# Patient Record
Sex: Female | Born: 1935 | Race: White | Hispanic: No
Health system: Southern US, Community
[De-identification: ages and names within clinical notes are randomized; demographics above are authoritative.]

## PROBLEM LIST (undated history)

## (undated) DIAGNOSIS — R062 Wheezing: Secondary | ICD-10-CM

## (undated) DIAGNOSIS — R0609 Other forms of dyspnea: Secondary | ICD-10-CM

## (undated) DIAGNOSIS — M1990 Unspecified osteoarthritis, unspecified site: Secondary | ICD-10-CM

## (undated) DIAGNOSIS — K219 Gastro-esophageal reflux disease without esophagitis: Secondary | ICD-10-CM

## (undated) DIAGNOSIS — N951 Menopausal and female climacteric states: Secondary | ICD-10-CM

## (undated) DIAGNOSIS — F430 Acute stress reaction: Secondary | ICD-10-CM

## (undated) DIAGNOSIS — J209 Acute bronchitis, unspecified: Secondary | ICD-10-CM

## (undated) DIAGNOSIS — R4789 Other speech disturbances: Secondary | ICD-10-CM

## (undated) DIAGNOSIS — E222 Syndrome of inappropriate secretion of antidiuretic hormone: Secondary | ICD-10-CM

## (undated) DIAGNOSIS — F4323 Adjustment disorder with mixed anxiety and depressed mood: Secondary | ICD-10-CM

## (undated) DIAGNOSIS — J45909 Unspecified asthma, uncomplicated: Secondary | ICD-10-CM

## (undated) DIAGNOSIS — R002 Palpitations: Secondary | ICD-10-CM

## (undated) DIAGNOSIS — E1149 Type 2 diabetes mellitus with other diabetic neurological complication: Secondary | ICD-10-CM

## (undated) DIAGNOSIS — D518 Other vitamin B12 deficiency anemias: Secondary | ICD-10-CM

## (undated) DIAGNOSIS — R0989 Other specified symptoms and signs involving the circulatory and respiratory systems: Secondary | ICD-10-CM

## (undated) DIAGNOSIS — M62838 Other muscle spasm: Secondary | ICD-10-CM

## (undated) DIAGNOSIS — N183 Chronic kidney disease, stage 3 unspecified: Secondary | ICD-10-CM

## (undated) DIAGNOSIS — E0590 Thyrotoxicosis, unspecified without thyrotoxic crisis or storm: Secondary | ICD-10-CM

## (undated) DIAGNOSIS — L242 Irritant contact dermatitis due to solvents: Secondary | ICD-10-CM

## (undated) DIAGNOSIS — R4182 Altered mental status, unspecified: Secondary | ICD-10-CM

## (undated) DIAGNOSIS — G4733 Obstructive sleep apnea (adult) (pediatric): Secondary | ICD-10-CM

## (undated) DIAGNOSIS — E785 Hyperlipidemia, unspecified: Secondary | ICD-10-CM

## (undated) DIAGNOSIS — G4700 Insomnia, unspecified: Secondary | ICD-10-CM

## (undated) DIAGNOSIS — N644 Mastodynia: Secondary | ICD-10-CM

## (undated) DIAGNOSIS — K921 Melena: Secondary | ICD-10-CM

## (undated) DIAGNOSIS — R5381 Other malaise: Secondary | ICD-10-CM

## (undated) DIAGNOSIS — I10 Essential (primary) hypertension: Secondary | ICD-10-CM

## (undated) DIAGNOSIS — L259 Unspecified contact dermatitis, unspecified cause: Secondary | ICD-10-CM

## (undated) DIAGNOSIS — R5383 Other fatigue: Secondary | ICD-10-CM

## (undated) DIAGNOSIS — N898 Other specified noninflammatory disorders of vagina: Secondary | ICD-10-CM

## (undated) DIAGNOSIS — M109 Gout, unspecified: Secondary | ICD-10-CM

## (undated) DIAGNOSIS — Z22322 Carrier or suspected carrier of Methicillin resistant Staphylococcus aureus: Secondary | ICD-10-CM

## (undated) DIAGNOSIS — E119 Type 2 diabetes mellitus without complications: Secondary | ICD-10-CM

## (undated) DIAGNOSIS — M25559 Pain in unspecified hip: Secondary | ICD-10-CM

## (undated) DIAGNOSIS — B351 Tinea unguium: Secondary | ICD-10-CM

## (undated) DIAGNOSIS — R82998 Other abnormal findings in urine: Secondary | ICD-10-CM

## (undated) DIAGNOSIS — W19XXXA Unspecified fall, initial encounter: Secondary | ICD-10-CM

## (undated) HISTORY — DX: Melena: K92.1

## (undated) HISTORY — DX: Essential (primary) hypertension: I10

## (undated) HISTORY — DX: Other muscle spasm: M62.838

## (undated) HISTORY — DX: Palpitations: R00.2

## (undated) HISTORY — PX: LUMBAR SPINE SURGERY: SHX701

## (undated) HISTORY — DX: Unspecified contact dermatitis, unspecified cause: L25.9

## (undated) HISTORY — DX: Other specified symptoms and signs involving the circulatory and respiratory systems: R06.09

## (undated) HISTORY — DX: Irritant contact dermatitis due to solvents: L24.2

## (undated) HISTORY — DX: Wheezing: R06.2

## (undated) HISTORY — DX: Chronic kidney disease, stage 3 (moderate): N18.3

## (undated) HISTORY — DX: Unspecified asthma, uncomplicated: J45.909

## (undated) HISTORY — DX: Insomnia, unspecified: G47.00

## (undated) HISTORY — DX: Pain in unspecified hip: M25.559

## (undated) HISTORY — DX: Hyperlipidemia, unspecified: E78.5

## (undated) HISTORY — DX: Unspecified fall, initial encounter: W19.XXXA

## (undated) HISTORY — DX: Adjustment disorder with mixed anxiety and depressed mood: F43.23

## (undated) HISTORY — DX: Other specified symptoms and signs involving the circulatory and respiratory systems: R09.89

## (undated) HISTORY — DX: Other specified noninflammatory disorders of vagina: N89.8

## (undated) HISTORY — DX: Other speech disturbances: R47.89

## (undated) HISTORY — DX: Unspecified osteoarthritis, unspecified site: M19.90

## (undated) HISTORY — DX: Type 2 diabetes mellitus without complications: E11.9

## (undated) HISTORY — DX: Tinea unguium: B35.1

## (undated) HISTORY — DX: Mastodynia: N64.4

## (undated) HISTORY — DX: Obstructive sleep apnea (adult) (pediatric): G47.33

## (undated) HISTORY — DX: Type 2 diabetes mellitus with other diabetic neurological complication: E11.49

## (undated) HISTORY — DX: Altered mental status, unspecified: R41.82

## (undated) HISTORY — DX: Other abnormal findings in urine: R82.998

## (undated) HISTORY — DX: Acute stress reaction: F43.0

## (undated) HISTORY — DX: Other fatigue: R53.83

## (undated) HISTORY — DX: Gastro-esophageal reflux disease without esophagitis: K21.9

## (undated) HISTORY — DX: Acute bronchitis, unspecified: J20.9

## (undated) HISTORY — DX: Other vitamin B12 deficiency anemias: D51.8

## (undated) HISTORY — PX: ABDOMINAL HYSTERECTOMY: SHX81

## (undated) HISTORY — DX: Gout, unspecified: M10.9

## (undated) HISTORY — DX: Other malaise: R53.81

## (undated) HISTORY — DX: Menopausal and female climacteric states: N95.1

## (undated) HISTORY — PX: APPENDECTOMY: SHX54

## (undated) HISTORY — DX: Thyrotoxicosis, unspecified without thyrotoxic crisis or storm: E05.90

## (undated) HISTORY — PX: TOTAL KNEE ARTHROPLASTY: SHX125

## (undated) HISTORY — PX: CERVICAL FUSION: SHX112

## (undated) HISTORY — DX: Chronic kidney disease, stage 3 unspecified: N18.30

---

## 1999-09-15 ENCOUNTER — Other Ambulatory Visit: Admission: RE | Admit: 1999-09-15 | Discharge: 1999-09-15 | Payer: Self-pay | Admitting: Internal Medicine

## 1999-09-19 ENCOUNTER — Encounter (HOSPITAL_COMMUNITY): Admission: RE | Admit: 1999-09-19 | Discharge: 1999-09-19 | Payer: Self-pay | Admitting: Internal Medicine

## 1999-10-11 ENCOUNTER — Encounter: Payer: Self-pay | Admitting: Internal Medicine

## 1999-10-11 ENCOUNTER — Encounter: Admission: RE | Admit: 1999-10-11 | Discharge: 1999-10-11 | Payer: Self-pay | Admitting: Internal Medicine

## 1999-10-19 ENCOUNTER — Encounter: Admission: RE | Admit: 1999-10-19 | Discharge: 1999-11-22 | Payer: Self-pay | Admitting: Internal Medicine

## 1999-10-25 ENCOUNTER — Encounter: Payer: Self-pay | Admitting: Internal Medicine

## 1999-10-25 ENCOUNTER — Encounter: Admission: RE | Admit: 1999-10-25 | Discharge: 1999-10-25 | Payer: Self-pay | Admitting: Internal Medicine

## 1999-11-18 ENCOUNTER — Ambulatory Visit (HOSPITAL_COMMUNITY): Admission: RE | Admit: 1999-11-18 | Discharge: 1999-11-18 | Payer: Self-pay | Admitting: Cardiology

## 1999-12-29 ENCOUNTER — Encounter: Payer: Self-pay | Admitting: Neurological Surgery

## 1999-12-29 ENCOUNTER — Encounter: Admission: RE | Admit: 1999-12-29 | Discharge: 1999-12-29 | Payer: Self-pay | Admitting: Neurological Surgery

## 2000-01-16 ENCOUNTER — Ambulatory Visit (HOSPITAL_COMMUNITY): Admission: RE | Admit: 2000-01-16 | Discharge: 2000-01-16 | Payer: Self-pay | Admitting: Cardiology

## 2000-02-05 ENCOUNTER — Emergency Department (HOSPITAL_COMMUNITY): Admission: EM | Admit: 2000-02-05 | Discharge: 2000-02-05 | Payer: Self-pay | Admitting: *Deleted

## 2000-02-16 ENCOUNTER — Ambulatory Visit: Admission: RE | Admit: 2000-02-16 | Discharge: 2000-02-16 | Payer: Self-pay | Admitting: Pulmonary Disease

## 2000-05-15 ENCOUNTER — Encounter: Payer: Self-pay | Admitting: Allergy and Immunology

## 2000-05-15 ENCOUNTER — Encounter: Admission: RE | Admit: 2000-05-15 | Discharge: 2000-05-15 | Payer: Self-pay | Admitting: *Deleted

## 2000-08-14 ENCOUNTER — Encounter: Payer: Self-pay | Admitting: Pulmonary Disease

## 2000-08-14 ENCOUNTER — Ambulatory Visit (HOSPITAL_COMMUNITY): Admission: RE | Admit: 2000-08-14 | Discharge: 2000-08-14 | Payer: Self-pay | Admitting: Pulmonary Disease

## 2001-01-30 ENCOUNTER — Other Ambulatory Visit: Admission: RE | Admit: 2001-01-30 | Discharge: 2001-01-30 | Payer: Self-pay | Admitting: Internal Medicine

## 2002-06-26 ENCOUNTER — Encounter: Payer: Self-pay | Admitting: Orthopedic Surgery

## 2002-07-02 ENCOUNTER — Inpatient Hospital Stay (HOSPITAL_COMMUNITY): Admission: RE | Admit: 2002-07-02 | Discharge: 2002-07-09 | Payer: Self-pay | Admitting: Orthopedic Surgery

## 2003-03-06 ENCOUNTER — Ambulatory Visit (HOSPITAL_COMMUNITY): Admission: RE | Admit: 2003-03-06 | Discharge: 2003-03-06 | Payer: Self-pay | Admitting: Internal Medicine

## 2005-10-06 ENCOUNTER — Ambulatory Visit: Payer: Self-pay | Admitting: Cardiology

## 2005-10-11 ENCOUNTER — Ambulatory Visit: Payer: Self-pay

## 2005-10-13 ENCOUNTER — Ambulatory Visit: Payer: Self-pay | Admitting: Cardiology

## 2005-10-16 ENCOUNTER — Ambulatory Visit: Admission: RE | Admit: 2005-10-16 | Discharge: 2005-10-16 | Payer: Self-pay | Admitting: Cardiology

## 2005-10-19 ENCOUNTER — Ambulatory Visit: Payer: Self-pay

## 2005-10-24 ENCOUNTER — Ambulatory Visit: Payer: Self-pay | Admitting: Cardiology

## 2005-10-31 ENCOUNTER — Ambulatory Visit: Payer: Self-pay | Admitting: Gastroenterology

## 2005-11-02 ENCOUNTER — Ambulatory Visit: Payer: Self-pay | Admitting: Pulmonary Disease

## 2006-02-05 ENCOUNTER — Ambulatory Visit: Payer: Self-pay | Admitting: Pulmonary Disease

## 2006-03-19 ENCOUNTER — Ambulatory Visit: Payer: Self-pay | Admitting: Pulmonary Disease

## 2006-05-25 ENCOUNTER — Encounter: Admission: RE | Admit: 2006-05-25 | Discharge: 2006-05-25 | Payer: Self-pay | Admitting: Internal Medicine

## 2007-02-18 ENCOUNTER — Encounter: Admission: RE | Admit: 2007-02-18 | Discharge: 2007-02-18 | Payer: Self-pay | Admitting: Neurological Surgery

## 2007-03-26 ENCOUNTER — Inpatient Hospital Stay (HOSPITAL_COMMUNITY): Admission: RE | Admit: 2007-03-26 | Discharge: 2007-03-28 | Payer: Self-pay | Admitting: Neurological Surgery

## 2007-04-29 ENCOUNTER — Encounter: Admission: RE | Admit: 2007-04-29 | Discharge: 2007-05-20 | Payer: Self-pay | Admitting: Specialist

## 2008-01-06 ENCOUNTER — Ambulatory Visit: Payer: Self-pay | Admitting: Cardiology

## 2008-01-06 LAB — CONVERTED CEMR LAB: Pro B Natriuretic peptide (BNP): 14 pg/mL (ref 0.0–100.0)

## 2008-01-15 ENCOUNTER — Encounter: Payer: Self-pay | Admitting: Cardiology

## 2008-01-15 ENCOUNTER — Encounter: Payer: Self-pay | Admitting: Internal Medicine

## 2008-01-15 ENCOUNTER — Ambulatory Visit: Payer: Self-pay

## 2008-02-05 DIAGNOSIS — R0602 Shortness of breath: Secondary | ICD-10-CM

## 2008-02-05 DIAGNOSIS — J45909 Unspecified asthma, uncomplicated: Secondary | ICD-10-CM

## 2008-02-05 DIAGNOSIS — K449 Diaphragmatic hernia without obstruction or gangrene: Secondary | ICD-10-CM | POA: Insufficient documentation

## 2008-02-05 DIAGNOSIS — K219 Gastro-esophageal reflux disease without esophagitis: Secondary | ICD-10-CM | POA: Insufficient documentation

## 2008-02-05 DIAGNOSIS — E785 Hyperlipidemia, unspecified: Secondary | ICD-10-CM | POA: Insufficient documentation

## 2008-02-05 DIAGNOSIS — G4733 Obstructive sleep apnea (adult) (pediatric): Secondary | ICD-10-CM

## 2008-02-06 ENCOUNTER — Ambulatory Visit: Payer: Self-pay | Admitting: Internal Medicine

## 2008-02-06 DIAGNOSIS — R05 Cough: Secondary | ICD-10-CM

## 2008-02-10 ENCOUNTER — Ambulatory Visit: Payer: Self-pay | Admitting: Cardiology

## 2008-02-20 ENCOUNTER — Ambulatory Visit: Payer: Self-pay | Admitting: Internal Medicine

## 2008-03-09 ENCOUNTER — Ambulatory Visit: Payer: Self-pay | Admitting: Internal Medicine

## 2008-03-11 ENCOUNTER — Ambulatory Visit: Payer: Self-pay | Admitting: Cardiology

## 2008-04-08 ENCOUNTER — Ambulatory Visit: Payer: Self-pay | Admitting: Internal Medicine

## 2008-04-08 DIAGNOSIS — R942 Abnormal results of pulmonary function studies: Secondary | ICD-10-CM | POA: Insufficient documentation

## 2008-05-04 ENCOUNTER — Encounter: Payer: Self-pay | Admitting: Internal Medicine

## 2008-05-08 ENCOUNTER — Ambulatory Visit: Payer: Self-pay | Admitting: Internal Medicine

## 2008-05-19 ENCOUNTER — Encounter: Payer: Self-pay | Admitting: Internal Medicine

## 2008-07-08 ENCOUNTER — Encounter (HOSPITAL_COMMUNITY): Admission: RE | Admit: 2008-07-08 | Discharge: 2008-10-06 | Payer: Self-pay | Admitting: Internal Medicine

## 2008-08-12 ENCOUNTER — Telehealth (INDEPENDENT_AMBULATORY_CARE_PROVIDER_SITE_OTHER): Payer: Self-pay | Admitting: *Deleted

## 2008-09-01 ENCOUNTER — Ambulatory Visit: Payer: Self-pay | Admitting: Internal Medicine

## 2008-10-01 ENCOUNTER — Ambulatory Visit: Payer: Self-pay | Admitting: Internal Medicine

## 2008-12-01 ENCOUNTER — Encounter (INDEPENDENT_AMBULATORY_CARE_PROVIDER_SITE_OTHER): Payer: Self-pay | Admitting: *Deleted

## 2008-12-01 ENCOUNTER — Ambulatory Visit: Payer: Self-pay | Admitting: Internal Medicine

## 2008-12-25 ENCOUNTER — Ambulatory Visit (HOSPITAL_COMMUNITY): Admission: RE | Admit: 2008-12-25 | Discharge: 2008-12-25 | Payer: Self-pay | Admitting: Internal Medicine

## 2008-12-25 ENCOUNTER — Encounter: Payer: Self-pay | Admitting: Internal Medicine

## 2009-01-27 ENCOUNTER — Ambulatory Visit: Payer: Self-pay | Admitting: Internal Medicine

## 2009-02-03 ENCOUNTER — Ambulatory Visit: Payer: Self-pay | Admitting: Internal Medicine

## 2009-02-11 ENCOUNTER — Encounter: Payer: Self-pay | Admitting: Internal Medicine

## 2009-02-11 ENCOUNTER — Ambulatory Visit (HOSPITAL_COMMUNITY): Admission: RE | Admit: 2009-02-11 | Discharge: 2009-02-11 | Payer: Self-pay | Admitting: Pediatrics

## 2009-02-22 ENCOUNTER — Encounter: Payer: Self-pay | Admitting: Internal Medicine

## 2009-02-23 ENCOUNTER — Encounter: Payer: Self-pay | Admitting: Cardiology

## 2009-02-23 ENCOUNTER — Ambulatory Visit: Payer: Self-pay | Admitting: Cardiology

## 2009-03-02 ENCOUNTER — Ambulatory Visit: Payer: Self-pay | Admitting: Cardiology

## 2009-03-02 LAB — CONVERTED CEMR LAB
Basophils Relative: 0.3 % (ref 0.0–3.0)
Chloride: 112 meq/L (ref 96–112)
Eosinophils Relative: 0.4 % (ref 0.0–5.0)
HCT: 34.7 % — ABNORMAL LOW (ref 36.0–46.0)
INR: 1 (ref 0.8–1.0)
Lymphs Abs: 2.1 10*3/uL (ref 0.7–4.0)
MCV: 104.7 fL — ABNORMAL HIGH (ref 78.0–100.0)
Monocytes Absolute: 0.4 10*3/uL (ref 0.1–1.0)
Potassium: 4.3 meq/L (ref 3.5–5.1)
RBC: 3.31 M/uL — ABNORMAL LOW (ref 3.87–5.11)
WBC: 8.1 10*3/uL (ref 4.5–10.5)

## 2009-03-05 ENCOUNTER — Inpatient Hospital Stay (HOSPITAL_BASED_OUTPATIENT_CLINIC_OR_DEPARTMENT_OTHER): Admission: RE | Admit: 2009-03-05 | Discharge: 2009-03-05 | Payer: Self-pay | Admitting: Internal Medicine

## 2009-03-05 ENCOUNTER — Ambulatory Visit: Payer: Self-pay | Admitting: Internal Medicine

## 2009-03-24 ENCOUNTER — Encounter: Payer: Self-pay | Admitting: *Deleted

## 2009-03-24 ENCOUNTER — Ambulatory Visit: Payer: Self-pay | Admitting: Internal Medicine

## 2009-03-24 ENCOUNTER — Encounter: Payer: Self-pay | Admitting: Physician Assistant

## 2009-03-24 DIAGNOSIS — I5032 Chronic diastolic (congestive) heart failure: Secondary | ICD-10-CM

## 2009-04-21 ENCOUNTER — Ambulatory Visit: Payer: Self-pay | Admitting: Internal Medicine

## 2009-06-11 ENCOUNTER — Encounter: Payer: Self-pay | Admitting: Internal Medicine

## 2009-07-05 ENCOUNTER — Ambulatory Visit: Payer: Self-pay | Admitting: Internal Medicine

## 2010-01-19 ENCOUNTER — Telehealth: Payer: Self-pay | Admitting: Internal Medicine

## 2010-02-03 ENCOUNTER — Encounter: Payer: Self-pay | Admitting: Internal Medicine

## 2010-03-02 ENCOUNTER — Ambulatory Visit: Payer: Self-pay | Admitting: Internal Medicine

## 2010-03-02 DIAGNOSIS — J3481 Nasal mucositis (ulcerative): Secondary | ICD-10-CM

## 2010-08-02 ENCOUNTER — Emergency Department (HOSPITAL_COMMUNITY): Admission: EM | Admit: 2010-08-02 | Discharge: 2010-08-03 | Payer: Self-pay | Admitting: Emergency Medicine

## 2010-09-13 ENCOUNTER — Emergency Department (HOSPITAL_COMMUNITY): Admission: EM | Admit: 2010-09-13 | Discharge: 2010-09-13 | Payer: Self-pay | Admitting: Emergency Medicine

## 2010-11-12 ENCOUNTER — Encounter
Admission: RE | Admit: 2010-11-12 | Discharge: 2010-11-12 | Payer: Self-pay | Source: Home / Self Care | Admitting: Neurological Surgery

## 2010-12-13 ENCOUNTER — Ambulatory Visit (HOSPITAL_COMMUNITY)
Admission: RE | Admit: 2010-12-13 | Discharge: 2010-12-13 | Payer: Self-pay | Source: Home / Self Care | Attending: Neurological Surgery | Admitting: Neurological Surgery

## 2011-01-19 NOTE — Assessment & Plan Note (Signed)
Summary: rov///kp   Visit Type:  Follow-up Copy to:  Dr. Larina Earthly Primary Provider/Referring Provider:  Dr. Larina Earthly  CC:  Pt here for9 month follow-up.  Pt c/o productiv ecough with clear phlegm. Pt also c/o sore in her left nostril since starting fluticasone. Marland Kitchen  History of Present Illness: #Followup dyspnea and cough. Normal cardiac cath 2000. Normaly Myoview 2006. Normal CT chest 07/1999. VQ scan normal 2001. Normal BNP Jan 2009. Passive smoker x 56 years. Obese.   #Dyspnea -IOV 02/06/2008: No energy. Gives out easily". Says starts "breathing heavy" and gets "tightness in chest" when walking distances such as parking lot to office. Dyspnea been going on for 4-5 years but has been progressive for past 8 months. Symptoms are relieved by rest. No dyspnea at rest. Gets dyspneic while fixing breakfast, doing laundry, and taking a shower. Extensive cardiac evaluation has been non contributory ( Dr. Jens Som at [place]). ADvair and Albuterol in past have not helped. #OV 03/09/2008: started on symbicort 03/09/2008 for BD response and reduced small airways on otherwise restrictive pft's with low dlco. No evidence of fibrosis on ct chest 02/2008. #OV 04/08/2008  no improvemnt subjectively or objectively on spiro. Symbicort dc'ed. Spiriva started instead. OV 05/08/2008: another 25% imprivement in dyspnea. OV 09/01/2008: has attended 7 weeks rehab. Another 33% improvment in dyspnea. 4 more weeks of rehab left. REC: change spiriva to LABA, continue rehab. #OV 10/01/2008: finished rehab today. Overall 40% improvement with rehab. Still dyspneic with laundry and walkng to car. Thinks is due to back pain.  REC: cont LABA and rehab.   #Cough - started on nasonex 03/09/2008. No relief. So changed to veramyst and astepro on 04/08/2008 with only 10% imprivment on followup 05/2008. Saw ENT Dr. Pollyann Kennedy on 05/19/2008 and got 10d augmentin for chronic max sinusitis. Since then, has had another 30% improvment in cough. OV 09/01/2008:   still with cough, early am sinus drainage and some sputum.  Feels veramyst is helping but astepro is not. REC: stop astepro, start chlorpheniramine. #OV 10/01/2008: cough resolved with chlorphem. REC: stop veramyst, cont chlorpheniramine  #New symptoms - having back pain. Due to see Dr. Danielle Dess.  #OV 12/01/2008:doe with 5 steps, REC: CPSTOV 01/27/2009:  followup after CPST. CPST was done in Jan 2010. She has multifactorial dyspnea. The key things that stood out were a) low VO2 max and Anaerobic threshold confirming definite physiologic limitation to exercise; b) low O2 pulse reflective of circulatory limitation probably from obesity related diastolic dysfn; b) post-exercise significant reduction in Fev1 and FVC; bronchospasm versus resp muscle fatigue; d) low mesured MVV compared to calculated MVV raising the spectre of neuromuscular disease; e) constellation of high Ve/VCO2 concomitantly rising with Ve/VO2 and low PETCO2 reflective of pain/anxiety/hyperventilation as cause of dyspnea.   In terms of symptoms, she is still very dyspneic. She again wonders if back pain is causing dyspnea. She is uanble to do ADLs like cooking. She has intentially lost weight but this is not helping relieve dyspnea. However, cough seems better. She describes it as "not bad". She is diligent with chlorpheniramine and veramyst. She is using hydrocodone as needed. Only other new symptom is that she admits to feeling depressed; describes less activity and less energy past 3-4 months but denies homicidal, suicidal thoughts or sleep issues.  REC: methachoOV 04/21/2009: Dyspnea and cough followup. Had methacholine challenge test 02/11/2009 and this was positive with just saline. Was evaluated by Dr Sandria Manly 02/22/09 and followed some days later.  Per patient neuro workup  was normal including cpk, tsh, and antibodies for myasthenia etc., I am unsure if she had EMG/NCS. She underwent Rt heart and Lt heart caths 03/05/2009. This was essentially normal  but for mildly elevated LVDEP cw diastolic dysfn which was susggested by her CPST. Currently reports good control of cough but dyspnea persists unchanged. She is frustrated with dyspnea and says she is getting depressed. REC: Rx back pain with Elsner, asthma/cough with symbicortOV 07/05/2009. Followup multifactorial dyspnea (asthma, obesity, diastolic dysfunction, and pain). She has seen Dr. Danielle Dess for back pain. Reporeteldy he recommended some injection but she declined. She is on chronic pain meds now. We restarte symbicort at last visit for asthma. After this dyspnea and cough have improved significantly. Now able to walk 60 feet and do some ADLs without dyspnea. Not used rescue albuterol in a long time. She is satisfied with her progress. Less depressed now. She wants veramyst changed to something cheaper  REC generic flonase Continue symbicort, pain control and exercise/rehab for dyspnea  OV 03/02/2010>  Followup multifactorial dyspnea (asthma, obesity, diastolic dysfunction, and pain) and post nasal drainage/cough. Since last visit states she wsa doing real well with symbicort and daily exercises untill 4-5 weeks ago when she had "The flu". Not hospitalized. REcovered. Now deconditioned and more dyspneic but back on treadmill and gaining strenght. Dyspnea improving to baseline. IN terms of post nasal drainge, this was under control with veramyst and 4-5 weeks ago she ran out of veramyst and was informed by insurance company to switch to generic fluticasone. States that generic version has given her a Left nasal ulcer. I reminded her that we swtiched to the generic version 9 months ago at her request but she mainatains that insurance company forced the change on her 4-6 weeks ago. She wants to appeal this  Current Medications (verified): 1)  Centrum Silver   Tabs (Multiple Vitamins-Minerals) .... Take 1 Tablet By Mouth Once A Day 2)  Caltrate 600+d 600-400 Mg-Unit  Tabs (Calcium Carbonate-Vitamin D)  .... Take 1 Tablet By Mouth Once A Day 3)  Lipitor 20 Mg  Tabs (Atorvastatin Calcium) .... Take 1 Tablet By Mouth Once A Day 4)  Nexium 40 Mg  Cpdr (Esomeprazole Magnesium) .... Take 1 Tablet By Mouth Once A Day 5)  Synthroid 112 Mcg  Tabs (Levothyroxine Sodium) .... Once Daily 6)  Clonazepam 1 Mg  Tabs (Clonazepam) .... Take 1 Tablet By Mouth At Bedtime 7)  Vitamin B12 Injection .... Take Once A Month 8)  Premarin 0.625 Mg/gm  Crea (Estrogens, Conjugated) .... Once Daily 9)  Hydromet 5-1.5 Mg/35ml  Syrp (Hydrocodone-Homatropine) .... As Needed 10)  Tramadol Hcl 50 Mg Tabs (Tramadol Hcl) .... Four Times A Day  As Needed For Pain 11)  Symbicort 80-4.5 Mcg/act Aero (Budesonide-Formoterol Fumarate) .... 2 Puffs Twice A Day 12)  Fluticasone Propionate 50 Mcg/act Susp (Fluticasone Propionate) .... 2 Puffs Each Nostril Two Times A Day  Allergies (verified): 1)  ! Aspirin 2)  ! Naprosyn 3)  ! Motrin 4)  ! Voltaren 5)  ! Tolectin 6)  ! Celebrex 7)  ! Clinoril  Past History:  Family History: Last updated: 02/23/2009 Coronary Heart Disease Father:died age 63 with CAD Mother:died age 83 CHF and dementia Siblings:Brother died age 48-drowning Sister age 26 severe stroke and HTN sister good health All father's brothers died of massive MI's and/or strokes  Social History: Last updated: 03/09/2008 does not smoke and rarely consumes alcohol. Daughter is Charity fundraiser at [employer]. Retired Audiological scientist. Widowed. Lives  by self. 38 year heavy passive smoking. [place] exp[place] 1980s  Risk Factors: Smoking Status: never (02/05/2008) Passive Smoke Exposure: yes (02/06/2008)  Past Medical History: Reviewed history from 04/21/2009 and no changes required. #OBSTRUCTIVE SLEEP APNEA (ICD-327.23) x diganosed 2006 after sleep study at [facility] per patient. -> Stopped after CPAP intolerance.  #DYSPNEA (ICD-786.05) - Obese patient with back isues and hx of passive smoking - normal TSH 11/2008 per patient -  Cardiac workup negative 2009 by Dr. Jens Som - CT chest 02/2008 did not show fibrosis - PFts 02/2008 restriction in spirometry (although positive BD response and reduced small aiways) and lung voume and lowered DLCO all c/w restrictive abnormality - CPST Jan 2010 - Low Vo2 and AT. Low O2 pluse. Post Exercise Bronchospasm +, high Ve/VCo2 with rising Ve/Vo2 and low pet co2 reflective of pain/anxiety/hyperventilation causing dyspnea - Positive methacholine with just saline 02/11/2009 - Negative neuro for dyspnea workup by Dr. Sandria Manly 02/2009 - Rt heart cath and LEft heart cath - 02/2009 essentially normal but for mild increase in LVEDP: Dr Jens Som #HYPERLIPIDEMIA (ICD-272.4) #HIATAL HERNIA (ICD-553.3) #GERD (ICD-530.81) #ASTHMA (ICD-493.90) x  -diagnosed by Dr. Jayme Cloud per pt.  Dx made 2001.  - Took Advair for 5 years and quit 3 years ago becuase she felt no difference.  #OBESITY BMI 33 Passive Smoker #HYPOTHYROIDISM  -> on synthroid -> normal TSH in 11/2008 per patient when checked at Dr. Vicente Males office  Past Surgical History: Reviewed history from 02/23/2009 and no changes required. Spondylosis L4-L5 with bilateral lumbar radiculopathy-4/08 Complete hysterectomy Tonsillectomy Right leg tendon surgery Left shoulder tumor removal Left knee surgery  Family History: Reviewed history from 02/23/2009 and no changes required. Coronary Heart Disease Father:died age 18 with CAD Mother:died age 52 CHF and dementia Siblings:Brother died age 51-drowning Sister age 45 severe stroke and HTN sister good health All father's brothers died of massive MI's and/or strokes  Social History: Reviewed history from 03/09/2008 and no changes required. does not smoke and rarely consumes alcohol. Daughter is Charity fundraiser at [employer]. 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 38 year heavy passive smoking. [place] exp[employer] 1980s  Review of Systems       The patient complains of productive cough and headaches.  The  patient denies shortness of breath with activity, shortness of breath at rest, non-productive cough, coughing up blood, chest pain, irregular heartbeats, acid heartburn, indigestion, loss of appetite, weight change, abdominal pain, difficulty swallowing, sore throat, tooth/dental problems, nasal congestion/difficulty breathing through nose, sneezing, itching, ear ache, anxiety, depression, hand/feet swelling, joint stiffness or pain, rash, change in color of mucus, and fever.    Vital Signs:  Patient profile:   77 year old female Height:      63 inches Weight:      172.50 pounds BMI:     30.67 O2 Sat:      96 % on Room air Temp:     97.7 degrees F oral Pulse rate:   95 / minute BP sitting:   136 / 72  (right arm) Cuff size:   regular  Vitals Entered By: Carron Curie CMA (March 02, 2010 3:17 PM)  O2 Flow:  Room air CC: Pt here for9 month follow-up.  Pt c/o productiv ecough with clear phlegm. Pt also c/o sore in her left nostril since starting fluticasone.  Comments Medications reviewed with patient Carron Curie CMA  March 02, 2010 3:20 PM Daytime phone number verified with patient.    Physical Exam  General:  obese.  Head:  normocephalic and atraumatic Eyes:  PERRLA/EOM intact; conjunctiva and sclera clear Ears:  TMs intact and clear with normal canals Nose:  left nostril medial aspect is s a dry ulcer/crud Mouth:  no deformity or lesions Neck:  supple with no JVD. Chest Wall:  no deformities noted Lungs:  clear to auscultation. Heart:  regular rate and rhythm. No murmurs noted. Abdomen:  soft Msk:  mild kyphosis Pulses:  2+ femoral pulses Extremities:  no edema Neurologic:  CN II-XII grossly intact with normal reflexes, coordination, muscle strength and tone Skin:  intact without lesions or rashes Cervical Nodes:  no significant adenopathy Psych:  depressed affect.     Impression & Recommendations:  Problem # 1:  NASAL MUCOSITIS ULCERATIVE  (ICD-478.11) Assessment New ? nasal uncler vs mucosal irritation  plan refer ent Orders: ENT Referral (ENT) Est. Patient Level II (21308)  Problem # 2:  SHORTNESS OF BREATH (SOB) (ICD-786.05) Assessment: Unchanged  Dyspnea due to a) anxiety/pain/hyperventilation >>> b) asthma >>> c) diastolic dysfn in that order of importance No real change since last visit. Had some flu but now better with reconditioning.   PLAN Continue to Address pain control through PMD and Dr. Danielle Dess.  Continue symbicort Cotninue to address diastolic dysfn through weight loss and OSA control - she rejected  CPAP Will see her again in 12 months  Orders: Est. Patient Level II (65784)  Problem # 3:  COUGH (ICD-786.2) Assessment: Deteriorated  under control  plan try to appeal and get veramyst back  Orders: Est. Patient Level II (69629)  Medications Added to Medication List This Visit: 1)  Nexium 40 Mg Cpdr (Esomeprazole magnesium) .... Take 1 tablet by mouth once a day 2)  Clonazepam 1 Mg Tabs (Clonazepam) .... Take 1 tablet by mouth at bedtime  Patient Instructions: 1)  Meet with our [facility] to see what we need to do appeal and get you veramyst back 2)  Please see ENT doctor for the blister in nose 3)  Please follow with Dr. Felipa Eth for routine healthcare 4)  You can see me back in 1 year   Immunization History:  Pneumovax Immunization History:    Pneumovax:  pneumovax (12/19/2007)

## 2011-01-19 NOTE — Progress Notes (Signed)
Summary: prescript  Phone Note Call from Patient   Caller: Patient Call For: Jillian Wheeler Summary of Call: insurance will not pay for verymst what is alternative? Initial call taken by: Rickard Patience,  January 19, 2010 3:21 PM  Follow-up for Phone Call        called spoke with patient, she states that she received a letter from ins company stating that the veramyst is not covered, but flunisolide and fluticasone propionate are preferred tier 1 and nasonex is a tier 2.    MR, is it okay to switch patient to one of these alternatives? Boone Master CNA  January 19, 2010 3:38 PM   Additional Follow-up for Phone Call Additional follow up Details #1::        yes that is ok. she can even try generic fluticasone if that would work. If I remember right she tried that in past but did not like it. Please also tell her to get NETTI POT for sinus wash Additional Follow-up by: Kalman Shan MD,  January 20, 2010 2:14 PM    Additional Follow-up for Phone Call Additional follow up Details #2::    called, spoke with pt.  Pt informed of above statement and recs per MR.  Pt ok to try fluticasone propionate and aware rx sent to [facility].  She verbaized understanding.   Follow-up by: Gweneth Dimitri RN,  January 20, 2010 3:31 PM  New/Updated Medications: FLUTICASONE PROPIONATE 50 MCG/ACT SUSP (FLUTICASONE PROPIONATE) 2 puffs each nostril two times a day Prescriptions: FLUTICASONE PROPIONATE 50 MCG/ACT SUSP (FLUTICASONE PROPIONATE) 2 puffs each nostril two times a day  #1 x 3   Entered by:   Gweneth Dimitri RN   Authorized by:   Kalman Shan MD   Signed by:   Gweneth Dimitri RN on 01/20/2010   Method used:   Faxed to ...       [facility] (retail)       [address]       [place], [place]  [address]       Ph: [phone] or [phone]       Fax: [phone]   RxID:   [id number]

## 2011-01-19 NOTE — Medication Information (Signed)
Summary: Veramyst/[employer]  Veramyst/[employer]   Imported By: Sherian Rein 03/24/2010 10:44:56  _____________________________________________________________________  External Attachment:    Type:   Image     Comment:   External Document

## 2011-02-27 LAB — CBC
Hemoglobin: 11.3 g/dL — ABNORMAL LOW (ref 12.0–15.0)
MCH: 34 pg (ref 26.0–34.0)
MCV: 99.7 fL (ref 78.0–100.0)
RBC: 3.32 MIL/uL — ABNORMAL LOW (ref 3.87–5.11)

## 2011-02-27 LAB — SURGICAL PCR SCREEN: Staphylococcus aureus: NEGATIVE

## 2011-03-02 LAB — BASIC METABOLIC PANEL
GFR calc Af Amer: 47 mL/min — ABNORMAL LOW (ref >60)
GFR calc non Af Amer: 38 mL/min — ABNORMAL LOW (ref >60)
Glucose, Bld: 109 mg/dL — ABNORMAL HIGH (ref 70–99)
Potassium: 4 mEq/L (ref 3.5–5.1)
Sodium: 141 mEq/L (ref 135–145)

## 2011-03-02 LAB — URINE CULTURE: Culture  Setup Time: 201109272014

## 2011-03-02 LAB — CBC
HCT: 35.4 % — ABNORMAL LOW (ref 36.0–46.0)
Hemoglobin: 11.9 g/dL — ABNORMAL LOW (ref 12.0–15.0)
MCH: 34.6 pg — ABNORMAL HIGH (ref 26.0–34.0)
MCHC: 33.6 g/dL (ref 30.0–36.0)
MCV: 102.9 fL — ABNORMAL HIGH (ref 78.0–100.0)
Platelets: 240 K/uL (ref 150–400)
RBC: 3.44 MIL/uL — ABNORMAL LOW (ref 3.87–5.11)
RDW: 13 % (ref 11.5–15.5)
WBC: 7.8 K/uL (ref 4.0–10.5)

## 2011-03-02 LAB — URINE MICROSCOPIC-ADD ON

## 2011-03-02 LAB — HEPATIC FUNCTION PANEL
ALT: 19 U/L (ref 0–35)
AST: 28 U/L (ref 0–37)
Alkaline Phosphatase: 74 U/L (ref 39–117)
Bilirubin, Direct: <0.1 mg/dL (ref 0.0–0.3)
Total Bilirubin: 0.5 mg/dL (ref 0.3–1.2)

## 2011-03-02 LAB — URINALYSIS, ROUTINE W REFLEX MICROSCOPIC
Glucose, UA: NEGATIVE mg/dL
Hgb urine dipstick: NEGATIVE
Ketones, ur: 15 mg/dL — AB
Nitrite: NEGATIVE
Protein, ur: NEGATIVE mg/dL
Specific Gravity, Urine: 1.021 (ref 1.005–1.030)
Urobilinogen, UA: 0.2 mg/dL (ref 0.0–1.0)
pH: 5 (ref 5.0–8.0)

## 2011-03-02 LAB — DIFFERENTIAL
Basophils Absolute: 0 K/uL (ref 0.0–0.1)
Basophils Relative: 0 % (ref 0–1)
Eosinophils Absolute: 0 K/uL (ref 0.0–0.7)
Eosinophils Relative: 0 % (ref 0–5)
Lymphocytes Relative: 36 % (ref 12–46)
Lymphs Abs: 2.8 10*3/uL (ref 0.7–4.0)
Monocytes Absolute: 0.6 10*3/uL (ref 0.1–1.0)
Monocytes Relative: 8 % (ref 3–12)
Neutro Abs: 4.4 10*3/uL (ref 1.7–7.7)
Neutrophils Relative %: 56 % (ref 43–77)

## 2011-03-02 LAB — BASIC METABOLIC PANEL WITH GFR
BUN: 15 mg/dL (ref 6–23)
CO2: 24 meq/L (ref 19–32)
Calcium: 9.8 mg/dL (ref 8.4–10.5)
Chloride: 110 meq/L (ref 96–112)
Creatinine, Ser: 1.32 mg/dL — ABNORMAL HIGH (ref 0.4–1.2)

## 2011-03-30 LAB — POCT I-STAT 3, VENOUS BLOOD GAS (G3P V)
Bicarbonate: 18.2 mEq/L — ABNORMAL LOW (ref 20.0–24.0)
O2 Saturation: 64 %
pCO2, Ven: 35.7 mmHg — ABNORMAL LOW (ref 45.0–50.0)
pH, Ven: 7.32 — ABNORMAL HIGH (ref 7.250–7.300)
pH, Ven: 7.321 — ABNORMAL HIGH (ref 7.250–7.300)
pO2, Ven: 36 mmHg (ref 30.0–45.0)
pO2, Ven: 37 mmHg (ref 30.0–45.0)

## 2011-03-30 LAB — POCT I-STAT 3, ART BLOOD GAS (G3+)
Acid-base deficit: 6 mmol/L — ABNORMAL HIGH (ref 0.0–2.0)
TCO2: 20 mmol/L (ref 0–100)
pH, Arterial: 7.376 (ref 7.350–7.400)

## 2011-05-02 NOTE — Cardiovascular Report (Signed)
NAMEYANILEN, ADAMIK                  ACCOUNT NO.:  [id number]   MEDICAL RECORD NO.:  [id number]          PATIENT TYPE:  OIB   LOCATION:  1962                         FACILITY:  [facility]   PHYSICIAN:  Bevelyn Buckles. Bensimhon, MDDATE OF BIRTH:  February 12, 1925   DATE OF PROCEDURE:  DATE OF DISCHARGE:  03/05/2009                            CARDIAC CATHETERIZATION   REFERRING PHYSICIAN:  Madolyn Frieze. Jens Som, MD, [facility]   PULMONOLOGIST:  Oretha Milch, MD   PATIENT IDENTIFICATION:  Ms. Jillian Wheeler is a delightful 77 year old woman  with history of severe exertional dyspnea.  She was evaluated by Dr.  Vassie Loll and Dr. Jens Som.  She had a cardiac catheterization about 10 years  ago which showed normal coronary arteries.  She is sent for right and  left heart cath to evaluate her dyspnea.   PROCEDURES PERFORMED:  1. Right heart cath.  2. Selective coronary angiography.  3. Left heart cath.   Left ventriculogram was not performed due to her renal insufficiency.   DESCRIPTION OF THE PROCEDURE:  The risks and indication were explained.  Consent was signed, placed on the chart.  A 4-French arterial sheath was  placed in the right femoral artery using a modified Seldinger technique.  Standard catheters including a JL-4, 3-D RC, and angled pigtail were  used for catheterization.  All catheter exchanges made over a wire.  There were no apparent complications.  A 7-French venous sheath was  placed in the right femoral vein using a modified Seldinger technique.  Standard Swan-Ganz catheter was used for the right heart cath.  Once  again, no apparent complications.   Total contrast used was 45-50 mL.   Right atrial pressure mean of 5, RV pressure 28/2, PA pressure 29/9 with  a mean of 18.  Pulmonary capillary wedge pressure mean of 11.  Central  aortic pressure 139/55 with a mean of 91.  LV pressure 139/10 with EDP  of 17.  There was no aortic stenosis.   Fick cardiac output was 5.6 L per minute.  Cardiac index was  3.0 L per  minute per meter squared.   Left main was normal.   LAD was a long vessel wrapping the apex, gave off a single diagonal  branch.  There was some mild luminal plaquing in the proximal LAD.   The left circumflex was a large dominant vessel, gave off an OM-1.  A  small first posterolateral, a large second posterolateral, and a large  PDA was angiographically normal.   Right coronary artery was a small nondominant vessel which was  angiographically normal.   ASSESSMENT:  1. Normal coronary arteries.  2. Normal right heart pressures.  3. Mildly increased left ventricular end-diastolic volume suggestive      of mild diastolic dysfunction.   PLAN/DISCUSSION:  Given her EDP, I do suspect she has some mild  diastolic dysfunction, but her degree of dyspnea seems well out of  portion to this.  We will her referred back to Pulmonary for further  workup.  I suspect there is also an element of deconditioning here.  Could consider an exercise  training program.      Bevelyn Buckles. Bensimhon, MD  Electronically Signed     DRB/MEDQ  D:  03/05/2009  T:  03/06/2009  Job:  [id number]   cc:   Madolyn Frieze. Jens Som, MD, [facility]  Oretha Milch, MD

## 2011-05-02 NOTE — Assessment & Plan Note (Signed)
[facility]                            CARDIOLOGY OFFICE NOTE   Jillian Wheeler, Jillian Wheeler                         MRN:          [id number]  DATE:01/06/2008                            DOB:          September 02, 1925    Jillian Wheeler is an 77 year old female who presents for evaluation of chest  pain and shortness of breath.  Note, the patient did undergo cardiac  catheterization in 2000.  At that time she was found to have normal  coronary arteries.  Her LV function was not assessed due to renal  insufficiency.  She also had her most recent Myoview performed on  October 11, 2005.  The ejection fraction 70%.  There was no ischemia or  infarction.  Her most recent echocardiogram was performed on October 19, 2005 showed normal LV function and mild diastolic dysfunction.  There  was trivial mitral regurgitation.  She has also had a pulmonary  evaluation in the past for dyspnea.  A CT scan performed August 14, 2000  showed no findings consistent with pulmonary fibrosis.  Previous VQ scan  was low probability performed in August 2001.  The patient states that  she has had persistent dyspnea on exertion.  It is somewhat worse now  than previous.  However, there is no orthopnea, PND, pedal edema,  palpitations, presyncope or syncope.  She also feels a tightness in her  chest.  This increases of activities and relieved with rest.  However,  does increase with cough as well.  These are symptoms that she has had  chronically.  Note, her pain does not radiate.  It is not related to  food.  There is no associated nausea, vomiting or diaphoresis but there  is shortness of breath.  Because the above we were asked to further  evaluate.  She has allergies to ASPIRIN, CLINORIL, NAPROSYN, MOTRIN,  VOLTAREN and TOLECTIN and CELEBREX.  She is also allergic to BUTAZOLIDIN  ALTA.   MEDICATIONS AT PRESENT:  1. Include vitamin B12 injections.  2. Multivitamin.  3. Caltrate.  4.  Wellbutrin.  5. Premarin.  6. Lipitor 10 mg daily.  7. Nexium 40 mg daily.  8. Synthroid 25 mcg daily.  9. Clonazepam.   SOCIAL HISTORY:  She does not smoke and rarely consumes alcohol.   FAMILY HISTORY:  Is positive coronary disease in her father.   PAST MEDICAL HISTORY:  There is no diabetes mellitus or hypertension.  There is hyperlipidemia.  She also has a history of treated  hyperthyroidism is now been treated for hypothyroidism.  There is also a  history of asthma as well as a hiatal hernia.  She has had previous  tonsillectomy as well as hysterectomy.  She has had previous back  surgery as well as knee replacement.  She has also had benign tumors  removed from her left neck area in her right breast.   REVIEW OF SYSTEMS:  She denies any headaches or fevers or chills.  There  is no productive cough, hemoptysis.  She does state that she feels like  she has a cold  at present.  There is no dysphagia, odynophagia, melena,  hematochezia.  There is no dysuria or hematuria.  There is no rash or  seizure activity.  There is no orthopnea, PND or pedal edema.  No can no  claudication.  The remaining systems are negative.   EXAMINATION:  Today shows a blood pressure 115/60 and pulse of 99.  She  weighs 180 pounds.  She is well-developed and somewhat obese.  She is no acute distress at  present.  Skin is warm and dry.  She is not depressed.  There is no peripheral clubbing.  BACK:  Normal.  Her HEENT is normal with normal eyelids.  Neck is supple with normal upstroke bilaterally.  I cannot appreciate  bruits.  There is no jugular distention.  No thyromegaly is noted.  Chest is clear to auscultation.  Normal expansion.  CARDIOVASCULAR:  Regular rhythm.  Normal S1-S2.  There are no murmurs,  rubs or gallops noted.  ABDOMEN:  Exam nontender, nondistended.  Positive bowel sounds.  No  hepatosplenomegaly.  No mass appreciated.  There is no abdominal bruit.  She is 2+ femoral pulses  bilaterally.  No bruits.  EXTREMITIES:  Show no edema.  No having palpate no cords.  She has 2+  posterior tibial pulses bilaterally.  NEUROLOGIC:  Grossly intact.   Her electrocardiogram shows a sinus rhythm at a rate of 90.  There is  poor R wave progression and prior anterior infarct cannot be excluded.  Prior inferior infarct can also not be excluded.   DIAGNOSIS:  1. Chronic dyspnea on exertion as well as chest tightness - she has      had an extensive evaluation for this in the past.  She had a      catheterization in 2000 that showed no obstructive coronary      disease.  Her LV function has been normal.  There has been a      question of diastolic dysfunction in the past.  We will plan to      repeat her adenosine Myoview to exclude significant coronary      disease.  We will also schedule an echocardiogram to quantify LV      and RV function.  There may be a component of diastolic      dysfunction.  Finally, we will check a BMP.  I will see her back in      4 to 6 weeks to review the above information.  2. History of hypothyroidism - per Dr. Felipa Eth.  3. Hyperlipidemia - she will continue on her statin.  4. Gastroesophageal reflux disease.   We will see back in 46 weeks.     Madolyn Frieze Jens Som, MD, [facility]  Electronically Signed    BSC/MedQ  DD: 01/06/2008  DT: 01/06/2008  Job #: [id number]   cc:   Larina Earthly, M.D.

## 2011-05-02 NOTE — Assessment & Plan Note (Signed)
[facility]                            CARDIOLOGY OFFICE NOTE   Jillian Wheeler, Jillian Wheeler                         MRN:          [id number]  DATE:02/10/2008                            DOB:          Mar 03, 1925    [name] is an extremely pleasant 77 year old female who I recently  saw on 01/06/08 secondary to shortness of breath and chest tightness.  Note, a previous catheterization in 2000 showed normal coronary  arteries.  She has also been noted to have shortness of breath with  exertion for quite some time.  She has had previous Myoview in October  2006 that showed no ischemia or infarction.  She has also had a previous  CT scan August 2001 that showed no pulmonary fibrosis and a VQ was low  prop.  When I saw her we schedule her to have an echocardiogram which  was performed on January 15, 2008.  Her LV function was normal.  There  was evidence of diastolic dysfunction.  There was no significant  valvular abnormalities noted.  A BNP was 14.  A Myoview was also  performed on January 15, 2008.  There is no ischemia or infarction and  her ejection fraction 85%.  She continues to have some dyspnea on  exertion but there is no orthopnea or PND.  There is no pedal edema.  She occasionally has tightness when she feels short of breath that  increases with inspiration.  Her medications at present include vitamin  B12 injections, a multivitamin, Caltrate, Wellbutrin, Premarin, Lipitor,  Nexium, clonazepam, and Synthroid.   The patient's physical exam today shows a blood pressure of 146/70 and  pulse is 94.  She weighs 189 pounds.  HEENT:  Normal.  NECK is supple.  CHEST:  Clear.  CARDIOVASCULAR:  Reveals a regular rate and rhythm.  ABDOMEN:  Exam shows no tenderness.  EXTREMITIES:  Show no edema.   DIAGNOSES:  1. Persistent dyspnea - the etiology of this is unclear.  However, I      feel comfortable in her cardiac evaluation.  Her Myoview shows no  ischemia or infarction, her echocardiogram shows normal LV function      and her BNP is normal at 14.  I do not think we need to pursue      further cardiac workup.  She has now seen a pulmonologist and tests      have been ordered by her report.  I think this is appropriate to      proceed with pulmonary evaluation as her cardiac evaluation is      benign.  We will see her back on as-needed basis.  2. History of hypothyroidism - per Dr. Felipa Eth.  3. Hyperlipidemia - per Dr. Felipa Eth.  4. Gastroesophageal reflux disease.   We will see back on as-needed basis.     Madolyn Frieze Jens Som, MD, [facility]  Electronically Signed    BSC/MedQ  DD: 02/10/2008  DT: 02/10/2008  Job #: [id number]   cc:   Larina Earthly, M.D.

## 2011-05-05 NOTE — Discharge Summary (Signed)
Jillian Wheeler, Jillian Wheeler                  ACCOUNT NO.:  [id number]   MEDICAL RECORD NO.:  [id number]          PATIENT TYPE:  INP   LOCATION:  3006                         FACILITY:  [facility]   PHYSICIAN:  Stefani Dama, M.D.  DATE OF BIRTH:  [birth year]   DATE OF ADMISSION:  03/26/2007  DATE OF DISCHARGE:  03/28/2007                               DISCHARGE SUMMARY   ADMITTING DIAGNOSIS:  Spondylosis L4-L5 with bilateral lumbar  radiculopathy.   DISCHARGE AND FINAL DIAGNOSIS:  Spondylosis L4-L5 with bilateral lumbar  radiculopathy.   CONDITION ON DISCHARGE:  Improving.   HOSPITAL COURSE:  Jillian Wheeler is a 77 year old individual who has had  significant difficulties with independent ambulation, having developed  severe lumbar radiculopathy due to lateral recess stenosis at the L4-L5  level.  Having failed substantial efforts at conservative management and  noting that her level of independent functioning was deteriorating at  home, she was advised regarding surgical intervention.  She tolerated  bilateral laminotomies well and noted almost immediate improvement in  the feeling in her lower extremities.  Her back had a moderate amount of  pain and discomfort secondary to the surgery.  She did require  evaluation help with physical therapy.  The patient lives independently  and wishes to return to independent living.  However, at the current  time it is felt that she will need temporary placement in the skilled  nursing facility to afford her some physical therapy.  She is being  transferred to such a situation day.  She will be seen in the office in  about three weeks' time for further follow-up and further  recommendations regarding intervention will be made at that time.  She  has required minimal amounts of pain medication in the form of Percocet,  and will be given a prescription for this medication at time of  discharge.      Stefani Dama, M.D.  Electronically Signed     HJE/MEDQ  D:  03/28/2007  T:  03/28/2007  Job:  [id number]

## 2011-05-05 NOTE — H&P (Signed)
[facility]  Patient:    Jillian Wheeler, Jillian Wheeler Visit Number: [id number] MRN: [id number]          Service Type: SUR Location: 1S X003 01 Attending Physician:  Loanne Drilling Dictated by:   Alexzandrew L. Perkins, P.A.-C. Admit Date:  07/02/2002                           History and Physical  DATE OF HISTORY AND PHYSICAL:  June 23, 2002  CHIEF COMPLAINT:  Left knee pain.  HISTORY OF PRESENT ILLNESS:  The patient is a 77 year old female seen by Dr. Ollen Gross for osteoarthritis of the left knee.  The knee has been symptomatic for some time now.  She has had markedly increased pain over the past couple of months.  The pain has been quite severe.  She had undergone a series of Synvisc injections in December 2002, with only short term relief. She has had cortisone injections in the past, which has only provided temporary relief.  She has been using Darvocet for pain.  She has reached the point where the pain is during the day and also at night, and comes in now to schedule surgery.  The risks and benefits have been discussed and she has elected to proceed with surgery.  ALLERGIES:  No known drug allergies.  CURRENT MEDICATIONS:  1. Ambien 5 mg, one p.o. q.d.  2. Atenolol 25 mg, one p.o. q.d.  3. Clycolobenycer 10 mg q.d.  4. Lipitor 10 mg q.d.  5. Synthroid 112 mcg q.d.  6. Hydrochlorothiazide 25 mg q.d.  7. Nexium 40 mg, two tablets q.d.  8. Cyanocobalamine injections 1600 mcg monthly.  9. NitroDur 2 mg/hr as needed. 10. Rhinocort suspension daily. 11. Advat diskus 250/50, two daily. 12. Hydromet syrup q.6h. as needed. 13. Zyrtec 10 mg q.d. 14. Premarin 0.3 mg q.d. 15. ______ 600 mg plus D.  PAST MEDICAL HISTORY: 1. Asthma. 2. History of bronchitis in 1984. 3. Hiatal hernia. 4. Reflux disease. 5. Urinary incontinence. 6. History of arthritis. 7. History of phlebitis, right leg. 8. Hypothyroidism.  PAST SURGICAL HISTORY: 1. Tonsillectomy  in 1945. 2. Right leg surgery in 1962. 3. Left shoulder surgery in 1963 with a tumor removed. 4. Right breast tumor excision in 1977. 5. Hysterectomy in 1981. 6. Right eye cataract surgery in 2003.  SOCIAL HISTORY:  She is a widow.  She works in Audiological scientist.  She is a nonsmoker. No alcohol.  Two children.  Daughter will be assisting her after surgery.  One-story home with no steps entering.  She lives alone.  FAMILY HISTORY:  Mother deceased at the age of 59 with a history of bilateral knee surgery and dementia.  Father deceased at the age of 24 with a history of myocardial infarction.  REVIEW OF SYSTEMS:  GENERAL: No fever, chills or night sweats.  NEUROLOGIC: No seizures, syncope or paralysis.  RESPIRATORY: She does have some intermittent shortness of breath with exertion.  She has had some cough since May, a slightly productive cough with some postnasal drip.  She does sleep on a wedge.  CARDIOVASCULAR: History of angina.  However, none recently.  No shortness of breath except intermittently during exertion.  GI: No nausea, vomiting, diarrhea, constipation, blood or mucus in the stool.  GU: She does have some urinary incontinence.  No dysuria, hematuria or discharge. MUSCULOSKELETAL: Pertinent to that found in the history of present illness.  PHYSICAL EXAMINATION:  VITAL SIGNS:  Pulse 64, respirations 20, blood pressure 118/64.  GENERAL:  The patient is a 77 year old female, well nourished and well developed.  She appears to be in no acute distress.  She is alert, oriented, cooperative and very pleasant at the time of exam.  HEENT:  Normocephalic and atraumatic.  Pupils round and reactive.  EOM intact.  NECK:  Supple.  CHEST:  Clear to auscultation in the anterior and posterior chest walls.  No rhonchi, rales or wheezing.  HEART:  Regular rate and rhythm.  No murmurs.  ABDOMEN:  Soft and nontender.   Bowel sounds are present.  RECTAL, BREASTS AND GENITALIA:  Not done.   Not pertinent to the present illness.  EXTREMITIES:  Limited to the left lower extremity.  Range of motion lacking 5 degrees of full extension to 150 degree of flexion.  There is no instability appreciated.   She is tender to palpation over the medial and lateral joint lines.  IMPRESSION: 1. Osteoarthritis of the left knee. 2. History of asthma. 3. History of pneumonia. 4. Hiatal hernia. 5. Reflux disease. 6. Urinary incontinence. 7. History of phlebitis. 8. Hypothyroidism.  PLAN:  The patient will be admitted to [facility] to undergo a let total knee replacement arthroplasty.  The surgery will be performed by Dr. Ollen Gross. Dictated by:   Alexzandrew L. Perkins, P.A.-C. Attending Physician:  Loanne Drilling DD:  06/29/02 TD:  07/01/02 Job: [id number]

## 2011-05-05 NOTE — Consult Note (Signed)
[facility]  Patient:    Jillian Wheeler, Jillian Wheeler Visit Number: [id number] MRN: [id number]          Service Type: SUR Location: 4W 0481 01 Attending Physician:  Loanne Drilling Proc. Date: 07/02/02 Admit Date:  07/02/2002 Discharge Date: 07/09/2002                            Consultation Report  ADDENDUM  (Previously dictated Summary was Work Number [id number])  ADMISSION DIAGNOSES:  Per previously dictated summary.  HOSPITAL COURSE ADDENDUM:  Jillian Wheeler underwent a left total knee replacement arthroplasty and was initially a candidate for rehabilitation services.  There was a possibility that she may go over to rehabilitation on July 07, 2002; therefore, priority summary arrangements were being made.  Due to insurance denial, she was unable to be transferred to the rehabilitation floor.  She did continue to require assistance with her ADLs and also mobility.  Therefore, a skilled facility was sought.  She continued with her therapy at [facility] and was progressing very well with physical therapy.  It was noted a bed became available at [facility] on July 09, 2002.  The patient is doing quite well.  Her hyponatremia that occurred postoperatively had resolved, and she was ready for transfer.  PLAN:  The patient will be transferred to [facility] facility on July 09, 2002.  DISCHARGE DIAGNOSES:  Please see previous Summary.  DISCHARGE MEDICATIONS:  Please see previous Summary.  DIET:  As tolerated.  ACTIVITY:  Weightbearing as tolerated.  Continue gait training ambulation with total knee protocol, PT, and OT for ADLs, gait training, mobility.  FOLLOWUP:  The patient will follow up with Dr. Lequita Halt in the office two weeks from date of surgery.  Please contact the office at [phone] to arrange appointment time and transfer this patient.  DISPOSITION:  [facility].  CONDITION UPON DISCHARGE:  Improved. Attending  Physician:  Loanne Drilling DD:  07/09/02 TD:  07/09/02 Job: [id number]

## 2011-05-05 NOTE — Discharge Summary (Signed)
[facility]  Patient:    Jillian Wheeler, Jillian Wheeler Visit Number: [id number] MRN: [id number]          Service Type: SUR Location: 4W 0481 01 Attending Physician:  Loanne Drilling Dictated by:   Alexzandrew L. Perkins, P.A.-C. Admit Date:  07/02/2002 Disc. Date: 07/07/02   CC:         Reuel Boom L. Thomasena Edis, M.D.   Discharge Summary  ADMITTING DIAGNOSES: 1. Osteoarthritis, left knee. 2. Asthma. 3. History of pneumonia. 4. Hiatal hernia. 5. Reflux disease. 6. Urinary incontinence. 7. History of phlebitis. 8. Hypothyroidism.  DISCHARGE DIAGNOSES: 1. Osteoarthritis, left knee status post left total knee replacement    arthroplasty. 2. Postoperative hyponatremia improving. 3. Asthma. 4. History of pneumonia. 5. Hiatal hernia. 6. Reflux disease. 7. Urinary incontinence. 8. History of phlebitis. 9. Hypothyroidism.  PROCEDURE:  The patient was taken to the OR on July 02, 2002 and underwent a left total knee replacement arthroplasty. Surgeon, Dr. Ollen Gross, assistant, Avel Peace, P.A.-C. and Roma Schanz, P.A.-S. The surgery was done under general anesthesia. Hemovac drain x1. Tourniquet time of 49 minutes at 350 mmHg.  CONSULTATIONS:  Rehabilitation services--Dr. Ellwood Dense.  BRIEF HISTORY:  The patient is a 77 year old female seen by Dr. Lequita Halt for osteoarthritis of the left knee. The knee has been symptomatic for some time now and the pain has increased over the past several months to the point where it was quite severe and interfere with her daily activities. She has undergone synvisc injections in December of last year with only short-term relief and cortisone injections with only temporary relief. It is felt she would benefit from undergoing a total knee. The risks and benefits were discussed and the patient subsequently admitted to the hospital.  LABORATORY DATA:  CBC on admission showed a hemoglobin of 12.0, hematocrit of 33.9, white  cell count 9.2, red cell count 3.25. Postop H&H 9.0 and 26.2, dropped again to 8.8 and 25.3 and was back up or stabilized at 8.8 and 25.9. PT and PTT on admission were 12.6 and 37 respectively. Serial pro times followed per Coumadin protocol. Last noted PT/INR is 29.3 and 3.5 respectively. Chem panel on admission decreased albumin of 3.4, elevated BUN of 24. Remaining chem panel all were within normal limits. Serial BMETs were followed. BUN came down from 24 back down to within normal limits, last noted at 23. Sodium dropped postoperatively down to 131 and then again lower at 122. The patient was placed on fluid restrictions. Sodium slowly came back up to 124 and was noted at 128 on July 06, 2002. There was a final BMET pending at the time of this dictation prior to her discharge. Urinalysis on admission was negative, blood group type O positive.  Chest x-ray report on the chart dated January 31, 2002, films of the chest show heart is borderline in size. Markings are heavy but no definite infiltrate is noted. Hiatal hernia is noted behind the heart. There is also a chest x-ray dated June 26, 2002. Cardiomegaly without evidence of decompression, this is stable. Low lung volumes and chronic lung disease, also hiatal hernia noted. EKG dated June 26, 2002 normal sinus rhythm, minimal voltage criteria for LVH, borderline EKG, no old tracings to compare, confirmed by Dr. Julieanne Manson.  HOSPITAL COURSE:  The patient was admitted to [facility], taken to the OR and underwent the above stated procedure without complications. The patient tolerated the procedure well and was later transferred to the recovery room and  then to the orthopedic floor for continued postop care. Vital signs were followed on the patient throughout the hospital course. The patient remained essentially afebrile throughout the hospital course. Placed weightbearing as tolerated. Received 24 hours of antibiotics  postoperatively, placed on Coumadin for three weeks. Labs were followed very closely. The patient was placed on PC analgesics for pain control following surgery. She was weaned off her PCA over to p.o. analgesics by postoperative day two. H&Hs were followed very closely. Hemoglobin did drop postop down to 9.0 and then to 8.8 at which point it stabilized. Unfortunately she had a drop in her sodium postop noted on postoperative day two down to 122. She was placed on fluid restrictions. Sodium did increase over the next several days back up to 124 on postoperative day three and 128 on postoperative day four. There was a BMET pending at the time of this discharge summary. Physical therapy and occupational therapy were consulted postop to assist with gait training, ambulation, and ADLs. She was up ambulating approximately 40 feet by postoperative day two and increased to 50 feet and then up to 80 feet. She was initially slow to progress with physical therapy and a rehab consult was called. The patient was seen in consultation by Dr. Ellwood Dense. It was felt that she would be an appropriate candidate for an inpatient rehab stay prior to returning home. Wound care was followed with initial dressing change starting on postoperative day two and then daily after that. The wound was healing well and incision healing well with no signs of infection, minimal swelling. She did well over the weekend and on Monday, July 07, 2002, there was a possibility of a rehab bed being available for the patient to be transferred over, arrangements were being made. She was tolerating p.o. analgesics doing well, progressing with therapy and it was decided the patient could be transferred at that time.  DISCHARGE PLAN: 1. The patient will be transferred to [facility] on July 07, 2002. 2. Discharge diagnosis please see above. 3. Discharge medications: Current medications at time of transfer include:    Colace  100 mg p.o. b.i.d.    Trinsicon 1 p.o. t.i.d.     Claritin 10 mg p.o. q.d.    Flexeril 10 mg p.o. q.d.    Advair 250/50 one puff b.i.d.    Tenormin 25 mg p.o. q.d.    Zocor 20 mg p.o. q.d.    Levothyroxine 112 mcg p.o. q.d.    Hydrochlorothiazide 25 mg p.o. q.d.    Protonix 40 mg p.o. b.i.d.    Nitroglycerin 0.2 mg an hour patch transdermal q.d.    Lexapro 5 mg p.o. q.h.s.    Nasalide 0.025% nasal spray q.d.    Advair 500/50 b.i.d. one puff.    Coumadin as per pharmacy protocol.    Reglan 10 p.o. q.8h p.r.n. nausea.    Percocet 1 or 2 every 4-6h. as needed for pain.    Tylenol 1 or 2 every 4-6h. as needed for mild pain, temperature or     headache.    Robaxin 500 mg p.o. q.6h. p.r.n. spasm.    Ventolin nebulizer 2.5 mg in 3 mL of nebulizer solution q.6h p.r.n.    Ambien 5-10 mg p.o. q.h.s.    She may also restart her Caltrate 600 + D b.i.d. 4. Diet: As tolerated. 5. Activity: She is weightbearing as tolerated to the left lower extremity.    Continue gait training ambulation, PT, OT for ADLs and  mobility while    in rehab. Continue total knee protocol, daily dressing changes. 6. Follow-up: The patient is to follow up with Dr. Lequita Halt in the office    two weeks from the date of surgery or following the discharge from the    [facility].  DISPOSITION:  [facility].  CONDITION ON DISCHARGE:  Improving.  Labs pending. There was a BMET pending at time of transfer. Dictated by:   Alexzandrew L. Perkins, P.A.-C. Attending Physician:  Loanne Drilling DD:  07/07/02 TD:  07/07/02 Job: [id number]

## 2011-05-05 NOTE — Op Note (Signed)
NAMEBRITLEY, GASHI                  ACCOUNT NO.:  [id number]   MEDICAL RECORD NO.:  [id number]          PATIENT TYPE:  INP   LOCATION:  2899                         FACILITY:  [facility]   PHYSICIAN:  Stefani Dama, M.D.  DATE OF BIRTH:  1925-07-09   DATE OF PROCEDURE:  03/26/2007  DATE OF DISCHARGE:                               OPERATIVE REPORT   PREOPERATIVE DIAGNOSIS:  Spondylosis L4-L5 with bilateral radiculopathy.   POSTOPERATIVE DIAGNOSIS:  Spondylosis L4-L5 with bilateral  radiculopathy.   PROCEDURE:  Bilateral laminotomy and foraminotomies, decompression of L5-  S1 nerve roots with operating microscope and microdissection technique.   SURGEON:  Stefani Dama, M.D.   FIRST ASSISTANT:  Dr. Trey Sailors.   ANESTHESIA:  General endotracheal.   INDICATIONS:  Jillian Wheeler is a 77 year old individual who has had  significant lumbar radiculopathy primarily in the right lower extremity  but more recently more severe in the left lower extremity.  She has  lateral recess stenosis secondary to spondylitic overgrowth at the L5-S1  level.  In actuality, it appears that she has four lumbar-type  vertebrae.  The last level, however, is called L4-L5 and this is the  last mobile segment.  She has a grade I anterolisthesis and she has  marked bilateral facette overgrowth causing some stenosis for lower  nerve roots particularly.  The upper nerve root on the right side  appears to be fairly stenotic also.  She is now taken to the operating  room to undergo decompression of the singular level which we will call  L4-L5.   PROCEDURE:  The patient was brought to the operating room supine on the  stretcher.  After smooth induction of general endotracheal anesthesia,  she was turned prone, her back was prepped with alcohol and DuraPrep and  draped in a sterile fashion.  A midline incision was created and carried  down to the lumbodorsal fascia which was opened on either side of the  midline to  expose the interlaminar space.  The first identifiable  spinous process and disc space was noted be that of L4-L5.  Dissection  was then carried down to expose the lamina which were noted to be  markedly hypertrophied and had particularly significant overgrowth in  the lateral aspects of the facettes.  These bony overgrowths were  drilled down with a 5 mm round bur and then the laminar arch was drilled  down with the same bur to expose the interlaminar space.  A  2.8 mm  dissecting tool was then used to dissect out the lateral recess.  A  partial facetectomy was performed and then the common dural tube was  identified and it was noted to be severely matted down, particularly on  the right side.  The S1 nerve root, which is the lower nerve root as it  had a straight trajectory, appeared to be severely flattened.  By gently  taking off the bone from the superior articular process of the lower  vertebrae, this nerve root could be decompressed.  Attachments to the  nerve root were plenty and these  were carefully dissected using a  microdissection technique.  The nerve root could be mobilized medially  in the superior aspect, the next nerve root up, which I will call L5,  though I am noting that this space is L4-L5, was identified and some  grumous overgrowth was encountered here compressing the nerve root in  its exit foramen.  This was removed using a 2 and a 3 mm Kerrison punch.  This was all done with the help of the operating microscope.  Once this  area was decompressed, common dural tube was noted to be well  decompressed.  Attention was turned to the left side.  Here a similar  procedure was carried out.  The lower nerve root, which I will call the  S1 nerve root, again because of a rather straight trajectory inferiorly,  was decompressed in the lateral recess which was tightly overgrown.  Once this nerve root was mobilized superiorly on the left side, there  was significant facetal  overgrowth compromising the upper nerve root  which I will note was L5.  This was decompressed in a similar fashion.  Hemostasis in the soft tissues was achieved and after careful inspection  for hemostasis and good decompression of the two nerve roots involved,  the epidural space was infiltrated with 40 mg of Depo-Medrol along with  1 mL of fentanyl.  It should be noted that a total of 30 mL of local  anesthetic was used in the paraspinous back muscles and in the soft  tissues.  The retractor was then removed.  The lumbodorsal fascia was  closed with #1 Vicryl, 2-0 Vicryl was used in the subcutaneous and  subcuticular tissues, 3-0 Vicryl subcuticularly to close the upper layer  near the skin.  The patient tolerated the procedure well.  Blood loss  estimated at less than 100 mL.      Stefani Dama, M.D.  Electronically Signed     HJE/MEDQ  D:  03/26/2007  T:  03/26/2007  Job:  [id number]

## 2011-05-05 NOTE — Op Note (Signed)
[facility]  Patient:    RICHARDINE, PEPPERS Visit Number: [id number] MRN: [id number]          Service Type: SUR Location: [id number] Attending Physician:  Loanne Drilling Dictated by:   Ollen Gross, M.D. Proc. Date: 07/02/02 Admit Date:  07/02/2002                             Operative Report  PREOPERATIVE DIAGNOSIS:  Osteoarthritis, left knee.  POSTOPERATIVE DIAGNOSIS:  Osteoarthritis, left knee.  PROCEDURE:  Left total knee arthroplasty.  SURGEON:  Ollen Gross, M.D.  ASSISTANT:  Avel Peace, P.A.-C.  ANESTHESIA:  General.  ESTIMATED BLOOD LOSS:  Minimal.  DRAINS:  Hemovac x1.  COMPLICATIONS:  None.  TOURNIQUET TIME:  49 minutes at 350 mmHg.  CONDITION:  Stable to recovery.  BRIEF CLINICAL NOTE:  Jillian Wheeler is a 77 year old female with severe osteoarthritis of the left knee with pain refractory to nonoperative management. She presents now for left total knee arthroplasty.  DESCRIPTION OF PROCEDURE:  After successful administration of general anesthetic, a tourniquet was placed high on the left thigh, left lower extremity prepped and draped in the usual sterile fashion. Extremities wrapped in Esmarch, knee flexed, tourniquet inflated to 350 mmHg. A standard midline incision was made with a 10 blade through the subcutaneous tissue to the level of the extensor mechanism. A fresh blade was used to make a medial parapatellar arthrotomy. The soft tissue over the proximal medial tibia subperiosteally elevated to the joint line with a knife and into the semimembranosus bursa with a curved osteotome. The soft tissue over the proximal lateral tibia is also elevated with attention being paid to avoiding the patellar tendon on tibial tubercle. The patella is then everted, knee flexed, and ACL and PCL removed. A drill was used to create a starting hole in the distal femur and the canal was irrigated. A 5 degree left valgus alignment guide was  then placed.  The block was pinned so as to remove 10 mm off the distal femur. Distal femoral resection made with an oscillating saw. An incising block was placed and a size 3 was the most appropriate. We referenced the rotation off the epicondylar axis. The size 3 cutting block is then placed and anterior and posterior cuts made.  Tibia subluxed forward and menisci removed. The extramedullary tibial alignment guide is placed referencing proximally at the medial aspect of the tibial tubercle and distally along the second metatarsal axis and tibial crest. The block was pinned to remove 10 mm off the nondeficient lateral side. Tibial resection is made with an oscillating saw. The cut bone is removed and then the proximal tibia repaired with the modular drill and keel punch for the mobile bearing tibial tray.  The intercondylar and chamfer block is then placed on the distal femur and those cuts subsequently made. The size 3 posterior stabilized femoral trial, size 3 mobile bearing tibial tray trial and 10 mm posterior stabilized rotating platform insert placed. Full extension is achieved with excellent varus and valgus balance throughout. I was able to flex her down past 120 degrees without any difficulty. The patella was then everted, thickness measured to be 22 mm, free hand resection taken down to approximately 13 mm and the 35 mm template placed, lug holes drilled, trial patella placed and patella tracks normally. All trials are then removed except the femoral trial and with the femoral trial in place all  the osteophytes are removed off the posterior femur.  That trial was then removed and the cut bone surfaces repaired with pulsatile lavage. Cement is mixed and once ready for implantation, the size 3 mobile bearing tibial tray, size 3 posterior stabilized femur and 35 mm patella is cemented into place and then patella is held with a clamp. The trial 10 mm insert is placed and the  knee held in full extension. All extruded cement is removed. Once the cement is fully hardened, a permanent 10 mm posterior stabilized rotation platform, size 3 insert is placed into the tibial tray. The knee is reduced with outstanding stability throughout. The wound was copiously irrigated with antibiotic solution and the extensor mechanism closed over a Hemovac drain with interrupted #1 PDS. The tourniquet was released with a total time of 49 minutes. Flexed against gravity, it was found to be 130 degrees. The subcu is then closed with interrupted 2-0 Vicryl, subcuticular with running 4-0 monocryl. The incision was cleaned and dried and Steri-Strips and a bulky sterile dressing applied. She was subsequently awakened and transported to recovery in stable condition. Dictated by:   Ollen Gross, M.D. Attending Physician:  Loanne Drilling DD:  07/02/02 TD:  07/05/02 Job: [id number]

## 2013-10-14 ENCOUNTER — Other Ambulatory Visit: Payer: Self-pay | Admitting: Internal Medicine

## 2013-10-14 ENCOUNTER — Ambulatory Visit
Admission: RE | Admit: 2013-10-14 | Discharge: 2013-10-14 | Disposition: A | Discharge status: Home / Self Care | Payer: Medicare HMO | Source: Ambulatory Visit | Attending: Internal Medicine | Admitting: Internal Medicine

## 2013-10-14 DIAGNOSIS — R4182 Altered mental status, unspecified: Secondary | ICD-10-CM

## 2013-10-21 ENCOUNTER — Other Ambulatory Visit: Payer: Self-pay | Admitting: Internal Medicine

## 2013-10-21 DIAGNOSIS — E0590 Thyrotoxicosis, unspecified without thyrotoxic crisis or storm: Secondary | ICD-10-CM

## 2013-10-24 ENCOUNTER — Ambulatory Visit
Admission: RE | Admit: 2013-10-24 | Discharge: 2013-10-24 | Disposition: A | Discharge status: Home / Self Care | Payer: Medicare HMO | Source: Ambulatory Visit | Attending: Internal Medicine | Admitting: Internal Medicine

## 2013-10-24 DIAGNOSIS — E0590 Thyrotoxicosis, unspecified without thyrotoxic crisis or storm: Secondary | ICD-10-CM

## 2013-11-28 ENCOUNTER — Encounter (INDEPENDENT_AMBULATORY_CARE_PROVIDER_SITE_OTHER): Payer: Self-pay

## 2013-11-28 ENCOUNTER — Encounter: Payer: Self-pay | Admitting: Neurology

## 2013-11-28 ENCOUNTER — Ambulatory Visit (INDEPENDENT_AMBULATORY_CARE_PROVIDER_SITE_OTHER): Payer: Medicare Other | Admitting: Neurology

## 2013-11-28 VITALS — BP 151/67 | HR 83 | Temp 96.0°F | Ht 60.5 in | Wt 156.0 lb

## 2013-11-28 DIAGNOSIS — R400 Somnolence: Secondary | ICD-10-CM

## 2013-11-28 DIAGNOSIS — R413 Other amnesia: Secondary | ICD-10-CM

## 2013-11-28 DIAGNOSIS — G4733 Obstructive sleep apnea (adult) (pediatric): Secondary | ICD-10-CM

## 2013-11-28 DIAGNOSIS — G4710 Hypersomnia, unspecified: Secondary | ICD-10-CM

## 2013-11-28 NOTE — Progress Notes (Signed)
Subjective:    Patient ID: Jillian Wheeler is a 79 y.o. female.  HPI    Huston Foley, MD, PhD [facility] [address] [address] [place], [place] [address]  Dear Dr. Felipa Eth,   I saw your patient, Jillian Wheeler, upon your kind request in my neurologic clinic today for initial consultation of her sleep disorder, in particular, concern for obstructive sleep apnea. The patient is accompanied by her daughter, Jillian Wheeler, today. As you know, Jillian Wheeler is a very pleasant 79 year old right-handed woman with an underlying medical history of diastolic heart failure, chronic cough, hyperlipidemia, asthma, reflux disease, degenerative spine disease, s/p lower back surgeries, who was diagnosed with OSA many years ago, but could not tolerate CPAP, c/o too much pressure and ill-fitting mask. Unfortunately, I don't have any records from her sleep studies to review today. She lives alone in a one story home. Her grandson's ex-wife lives close by and checks on the patient regularly. She has not been driving for the past 6 weeks, and while she does not like it, she accepted your recommendation to no longer drives. She complains of daytime tiredness, memory loss with short term memory issues and feels easily exhausted for the last few months.. You suspected, that her underlying untreated OSA may be a contributor to her Sx. She had a thyroid ultrasound on 10/24/2013: Thyroid is normal in size with heterogeneous echotexture diffusely. Multiple small nodules in the left. No dominant worrisome nodule is identified. She also had a recent adjustment in her thyroid medicine.  She has RLS symptoms and has been on flexeril which you discontinued, d/t sedation. She does sleep with a small soap bar under her sheets at the foot end and she claims it helps.   Her typical bedtime is reported to be around 10 PM and usual wake time is around 7 to 10 AM. Sleep onset typically occurs within 30 minutes. She  reports feeling adequately rested upon awakening. She wakes up on an average 2 times in the middle of the night and has to go to the bathroom 1 to 2 times on a typical night. She denies morning headaches.  She reports excessive daytime somnolence (EDS) and Her Epworth Sleepiness Score (ESS) is 17/24 today. She has not fallen asleep while driving. The patient has been taking a planned nap, which is usually 1 hour long.  She has been known to snore for the past many years. She does not drink much in the way of caffeine and dislikes tea Her bedroom is usually dark and cool, but she sleeps with an electric blanket. There is a TV in the bedroom and usually it is not on at night.   Her Past Medical History Is Significant For: Past Medical History  Diagnosis Date  . Palpitations   . Unspecified fall   . Altered mental status   . Other nonspecific finding on examination of urine   . Symptomatic menopausal or female climacteric states   . Insomnia, unspecified   . Contact dermatitis and other eczema, due to unspecified cause   . Type II or unspecified type diabetes mellitus with neurological manifestations, not stated as uncontrolled(250.60)   . Dermatophytosis of nail   . Mastodynia   . Contact dermatitis and other eczema due to solvents   . Chronic kidney disease, stage III (moderate)   . Acute bronchitis   . Wheezing   . Pain in joint, pelvic region and thigh   . Other speech disturbance(784.59)   .  Unspecified acute reaction to stress   . Other malaise and fatigue   . Spasm of muscle   . Blood in stool   . Other specified noninflammatory disorder of vagina   . Osteoarthrosis, unspecified whether generalized or localized, unspecified site   . Gout, unspecified   . Adjustment disorder with mixed anxiety and depressed mood   . Other vitamin B12 deficiency anemia   . Obstructive sleep apnea (adult) (pediatric)   . Other dyspnea and respiratory abnormality   . Type II or unspecified type  diabetes mellitus without mention of complication, not stated as uncontrolled   . Unspecified asthma(493.90)   . Esophageal reflux   . Other and unspecified hyperlipidemia   . Thyrotoxicosis without mention of goiter or other cause, without mention of thyrotoxic crisis or storm   . Unspecified essential hypertension     Her Past Surgical History Is Significant For: History reviewed. No pertinent past surgical history.  Her Family History Is Significant For: Family History  Problem Relation Age of Onset  . Cancer Sister   . Osteoarthritis Mother   . Heart disease    . Stroke Sister     Her Social History Is Significant For: History   Social History  . Marital Status: Widowed    Spouse Name: N/A    Number of Children: N/A  . Years of Education: N/A   Social History Main Topics  . Smoking status: Never Smoker   . Smokeless tobacco: None  . Alcohol Use: No  . Drug Use: No  . Sexual Activity: None   Other Topics Concern  . None   Social History Narrative  . None    Her Allergies Are:  Allergies  Allergen Reactions  . Aspirin   . Celecoxib   . Diclofenac Sodium   . Ibuprofen   . Naproxen   . Sulindac   :   Her Current Medications Are:  Outpatient Encounter Prescriptions as of 11/28/2013  Medication Sig  . atorvastatin (LIPITOR) 20 MG tablet Take 1 tablet by mouth daily.  . Calcium Carbonate-Vitamin D (CALTRATE 600+D) 600-400 MG-UNIT per tablet Take 1 tablet by mouth daily.  . clobetasol ointment (TEMOVATE) 0.05 % Apply 1 application topically as needed.  . clonazePAM (KLONOPIN) 2 MG tablet Take 1.5 tablets by mouth at bedtime.  . cyanocobalamin (,VITAMIN B-12,) 1000 MCG/ML injection Inject 1,000 mcg into the muscle every 30 (thirty) days.  Marland Kitchen esomeprazole (NEXIUM) 40 MG capsule Take 40 mg by mouth daily at 12 noon.  . estrogens, conjugated, (PREMARIN) 0.625 MG tablet Take 0.625 mg by mouth daily. Take daily for 21 days then do not take for 7 days.  Marland Kitchen  levothyroxine (SYNTHROID, LEVOTHROID) 112 MCG tablet Take 1 tablet by mouth daily.  . Multiple Vitamin (MULTIVITAMIN) tablet Take 1 tablet by mouth daily.  . traMADol (ULTRAM) 50 MG tablet Take 1 tablet by mouth as needed.  . triamcinolone cream (KENALOG) 0.1 % Apply 1 application topically 2 (two) times daily.  Marland Kitchen ULORIC 40 MG tablet Take 1 tablet by mouth daily.  :  Review of Systems:  Out of a complete 14 point review of systems, all are reviewed and negative with the exception of these symptoms as listed below:  Review of Systems  Constitutional: Positive for appetite change, fatigue and unexpected weight change.  HENT: Positive for hearing loss and rhinorrhea.   Eyes: Negative.   Respiratory: Positive for shortness of breath.        Snoring  Gastrointestinal: Negative.  Endocrine: Positive for cold intolerance.  Genitourinary: Negative.   Musculoskeletal: Positive for arthralgias.  Skin: Negative.   Allergic/Immunologic: Positive for environmental allergies.  Neurological: Positive for weakness.       Memory loss, restless leg  Hematological: Negative.   Psychiatric/Behavioral: Positive for confusion and sleep disturbance (insomnia). The patient is nervous/anxious.     Objective:  Neurologic Exam  Physical Exam Physical Examination:   Filed Vitals:   11/28/13 1214  BP: 151/67  Pulse: 83  Temp: 96 F (35.6 C)    General Examination: The patient is a very pleasant 79 y.o. female in no acute distress. She appears frail and is not obese. She is well groomed.   HEENT: Normocephalic, atraumatic, pupils are equal, round and reactive to light and accommodation. Funduscopic exam is normal with sharp disc margins noted. Extraocular tracking is good without limitation to gaze excursion or nystagmus noted. Normal smooth pursuit is noted. Hearing is grossly intact. Tympanic membranes are clear bilaterally. Face is symmetric with normal facial animation and normal facial sensation.  Speech is clear with no dysarthria noted. There is no hypophonia. There is no lip, neck/head, jaw or voice tremor. Neck is supple with full range of passive and active motion. There are no carotid bruits on auscultation. Oropharynx exam reveals: mild mouth dryness, adequate dental hygiene and mild airway crowding, due to narrow airway entry and floppy appearing soft palate. Mallampati is class II. Tongue protrudes centrally and palate elevates symmetrically. Tonsils are absent (since age 57, she states).   Chest: Clear to auscultation without wheezing, rhonchi or crackles noted.  Heart: S1+S2+0, regular and normal without murmurs, rubs or gallops noted.   Abdomen: Soft, non-tender and non-distended with normal bowel sounds appreciated on auscultation.  Extremities: There is trace pitting edema in the distal lower extremities bilaterally. Pedal pulses are intact.  Skin: Warm and dry without trophic changes noted. There are no varicose veins.  Musculoskeletal: exam reveals changes consistent with arthritis in her hands, particularly her MCP joints. She has some bruising.  Neurologically:  Mental status: The patient is awake, alert and oriented in all 4 spheres. Her memory, attention, language and knowledge are appropriate. She has occasional difficulty giving a chronological or concise history and her daughter helps out with information. Her remote memory is good. There is no aphasia, agnosia, apraxia or anomia. Speech is clear with normal prosody and enunciation. Thought process is linear. Mood is congruent and affect is normal.  Cranial nerves are as described above under HEENT exam. In addition, shoulder shrug is normal with equal shoulder height noted. Motor exam: Normal bulk, strength and tone is noted. There is no drift, tremor or rebound. Romberg is negative. Reflexes are 2+ throughout. Toes are downgoing bilaterally. Fine motor skills are intact with normal finger taps, normal hand movements,  normal rapid alternating patting, normal foot taps and normal foot agility.  Cerebellar testing shows no dysmetria or intention tremor on finger to nose testing. Heel to shin is unremarkable bilaterally. There is no truncal or gait ataxia.  Sensory exam is intact to light touch, pinprick, vibration, temperature sense in the upper and lower extremities.  Gait, station and balance: She stands up slowly and stands slightly wide-based. No veering to one side is noted. No leaning to one side is noted. Posture is age-appropriate and t mildly stooped. She walks with a cane. She turns cautiously.   Assessment and Plan:   In summary, Jillian Wheeler is a very pleasant 79 y.o.-year old female  with an underlying medical history of diastolic heart failure, chronic cough, thyroid d/s, hyperlipidemia, asthma, reflux disease, degenerative spine disease, s/p lower back surgeries, who was diagnosed with OSA many years ago, but could not tolerate CPAP, c/o too much pressure and ill-fitting mask. She has recently been complaining of daytime sleepiness, feeling exhausted, and memory loss and while these symptoms may be the result of multiple issues including thyroid dysfunction, revisiting her sleep apnea is an appropriate step. To that end, I had a long chat with the patient and her daughter about my findings and the diagnosis of OSA, its prognosis and treatment options. We talked about medical treatments and non-pharmacological approaches. I explained in particular the risks and ramifications of untreated moderate to severe OSA, especially with respect to developing cardiovascular disease including heart failure, difficult to treat hypertension, cardiac arrhythmias, or stroke. Even type 2 diabetes has in part been linked to untreated OSA. We talked about trying to maintain a healthy lifestyle in general, as well as the importance of good nutrition. I encouraged the patient to eat healthy, exercise daily and keep well hydrated, to  keep a scheduled bedtime and wake time routine, to not skip any meals and eat healthy snacks in between meals.  I recommended the following at this time: sleep study with potential positive airway pressure titration. She indicated that she would be willing to try CPAP. I explained to her that over the course of time the machines have become much quieter and more easier to use and masks have become more conducive to sleep and more tolerable. We will certainly try to titrate her cautiously as she is apprehensive. I explained the sleep test procedure to the patient and also outlined possible surgical and non-surgical treatment options of OSA, including the use of a custom-made dental device, upper airway surgical options, such as pillar implants, radiofrequency surgery, tongue base surgery, and UPPP. I also explained the CPAP treatment option to the patient, who indicated that she would be willing to re-try CPAP if the need arises. I explained the importance of being compliant with PAP treatment, not only for insurance purposes but primarily to improve Her symptoms, and for the patient's long term health benefit, including to reduce Her cardiovascular risks. I answered all their questions today and the patient and her daughter were in agreement. I would like to see her back after the sleep study is completed and encouraged them to call with any interim questions, concerns, problems or updates.    Thank you very much for allowing me to participate in the care of this nice patient. If I can be of any further assistance to you please do not hesitate to call me at [phone].  Sincerely,   Huston Foley, MD, PhD

## 2013-11-28 NOTE — Patient Instructions (Signed)

## 2013-12-17 ENCOUNTER — Ambulatory Visit (INDEPENDENT_AMBULATORY_CARE_PROVIDER_SITE_OTHER): Payer: Medicare Other

## 2013-12-17 VITALS — Ht 60.25 in | Wt 157.0 lb

## 2013-12-17 DIAGNOSIS — G479 Sleep disorder, unspecified: Secondary | ICD-10-CM

## 2013-12-17 DIAGNOSIS — G4733 Obstructive sleep apnea (adult) (pediatric): Secondary | ICD-10-CM

## 2013-12-17 DIAGNOSIS — G4720 Circadian rhythm sleep disorder, unspecified type: Secondary | ICD-10-CM

## 2014-01-02 ENCOUNTER — Encounter: Payer: Self-pay | Admitting: Neurology

## 2014-01-02 ENCOUNTER — Encounter (INDEPENDENT_AMBULATORY_CARE_PROVIDER_SITE_OTHER): Payer: Self-pay

## 2014-01-02 ENCOUNTER — Ambulatory Visit (INDEPENDENT_AMBULATORY_CARE_PROVIDER_SITE_OTHER): Payer: Medicare HMO | Admitting: Neurology

## 2014-01-02 VITALS — BP 145/72 | HR 84 | Temp 97.6°F | Ht 60.5 in | Wt 148.0 lb

## 2014-01-02 DIAGNOSIS — R400 Somnolence: Secondary | ICD-10-CM

## 2014-01-02 DIAGNOSIS — R413 Other amnesia: Secondary | ICD-10-CM

## 2014-01-02 DIAGNOSIS — G4710 Hypersomnia, unspecified: Secondary | ICD-10-CM

## 2014-01-02 NOTE — Patient Instructions (Addendum)
Please remember to try to maintain good sleep hygiene, which means: Keep a regular sleep and wake schedule, try not to exercise or have a meal within 2 hours of your bedtime, try to keep your bedroom conducive for sleep, that is, cool and dark, without light distractors such as an illuminated alarm clock, and refrain from watching TV right before sleep or in the middle of the night and do not keep the TV or radio on during the night. Also, try not to use or play on electronic devices at bedtime, such as your cell phone, tablet PC or laptop. If you like to read at bedtime on an electronic device, try to dim the background light as much as possible. Do not eat in the middle of the night.   For your night time sleep, I recommend gradual reduction of your clonazepam, as it is addicting and can cause daytime drowsiness, confusion and balance issues. You can try Melatonin at night for sleep: take 3 to 6 mg one hour before your bedtime.   Try taking tramadol at night, instead of in the morning.   Consider a trial of Remeron for sleep - discuss with Dr. Felipa EthAvva.

## 2014-01-02 NOTE — Progress Notes (Signed)
Subjective:    Patient ID: Jillian Wheeler is a 80 y.o. female.  HPI  Interim history:   Jillian Wheeler is a very pleasant 80 year old right-handed woman with an underlying medical history of diastolic heart failure, memory loss, chronic cough, hyperlipidemia, asthma, reflux disease, degenerative spine disease, s/p 2 lower back surgeries under Dr. Ellene Route, and prior diagnosis of OSA with intolerance to CPAP, who presents for followup consultation after a recent sleep study and 12/17/2013. She is accompanied by her son and her daughter today. I first met her on 11/28/2013 at the request of her primary care physician at which time she reported a prior diagnosis of obstructive sleep apnea many years ago but she could not tolerate CPAP complaining of too much pressure and ill fitting mask. I suggested she come back for a repeat sleep study since I did not have any prior records available and these were from many years ago as understand. She had a baseline sleep study on 12/17/2013 and I went over her test results with her and her family today in detail. The sleep efficiency was markedly reduced at 43.7% with a latency to sleep of 6 minutes but long wake after sleep onset of 224 minutes with overall mild sleep fragmentation noted but a long period of wakefulness starting at 1:25 AM until the end of the study. She initially complained of feeling to cold, then she complained of being hot and was given a second comfort her but then the thermostat had to be turned down to decrease the room temperature. A couple of bouts of cough for noted. She really did not have much in the way of snoring. She had 3 obstructive and 2 central hypopneas. Her overall AHI was normal at 1.7 per hour. Baseline oxygen saturation was 94%, nadir was 90%. She had absence of slow-wave sleep and increased percentage of light stage sleep and a decreased percentage of REM sleep with a prolonged REM latency. Based on the absence of supine sleep, little REM  sleep achieved, and decreased sleep efficiency I felt that there may of been an underestimation of her underlying obstructive sleep disordered breathing. But based on the current test results she does not have any significant central or obstructive sleep disordered breathing.   Today, she reports, that she sleeps poorly at night and she had been on Klonopin 4 mg each night, but that was reduced to 3 mg. She has not taken OTC sleep aids from what I understand. She takes Ultram 50 mg PO in AM. She has lost appetite and has lost weight.   She had a thyroid ultrasound on 10/24/2013: Thyroid is normal in size with heterogeneous echotexture diffusely. Multiple small nodules in the left. No dominant worrisome nodule is identified. She also had a recent adjustment in her thyroid medicine.  She has RLS symptoms and had been on flexeril which was discontinued, d/t sedation. She does sleep with a small soap bar under her sheets at the foot end and she claims it helps.   Her Past Medical History Is Significant For: Past Medical History  Diagnosis Date  . Palpitations   . Unspecified fall   . Altered mental status   . Other nonspecific finding on examination of urine   . Symptomatic menopausal or female climacteric states   . Insomnia, unspecified   . Contact dermatitis and other eczema, due to unspecified cause   . Type II or unspecified type diabetes mellitus with neurological manifestations, not stated as uncontrolled   .  Dermatophytosis of nail   . Mastodynia   . Contact dermatitis and other eczema due to solvents   . Chronic kidney disease, stage III (moderate)   . Acute bronchitis   . Wheezing   . Pain in joint, pelvic region and thigh   . Other speech disturbance(784.59)   . Unspecified acute reaction to stress   . Other malaise and fatigue   . Spasm of muscle   . Blood in stool   . Other specified noninflammatory disorder of vagina   . Osteoarthrosis, unspecified whether generalized or  localized, unspecified site   . Gout, unspecified   . Adjustment disorder with mixed anxiety and depressed mood   . Other vitamin B12 deficiency anemia   . Obstructive sleep apnea (adult) (pediatric)   . Other dyspnea and respiratory abnormality   . Type II or unspecified type diabetes mellitus without mention of complication, not stated as uncontrolled   . Unspecified asthma(493.90)   . Esophageal reflux   . Other and unspecified hyperlipidemia   . Thyrotoxicosis without mention of goiter or other cause, without mention of thyrotoxic crisis or storm   . Unspecified essential hypertension     Her Past Surgical History Is Significant For: History reviewed. No pertinent past surgical history.  Her Family History Is Significant For: Family History  Problem Relation Age of Onset  . Cancer Sister   . Osteoarthritis Mother   . Heart disease    . Stroke Sister     Her Social History Is Significant For: History   Social History  . Marital Status: Widowed    Spouse Name: N/A    Number of Children: N/A  . Years of Education: N/A   Social History Main Topics  . Smoking status: Never Smoker   . Smokeless tobacco: None  . Alcohol Use: No  . Drug Use: No  . Sexual Activity: None   Other Topics Concern  . None   Social History Narrative  . None    Her Allergies Are:  Allergies  Allergen Reactions  . Aspirin   . Celecoxib   . Diclofenac Sodium   . Ibuprofen   . Naproxen   . Sulindac   :   Her Current Medications Are:  Outpatient Encounter Prescriptions as of 01/02/2014  Medication Sig  . atorvastatin (LIPITOR) 20 MG tablet Take 1 tablet by mouth daily.  . Calcium Carbonate-Vitamin D (CALTRATE 600+D) 600-400 MG-UNIT per tablet Take 1 tablet by mouth daily.  . clobetasol ointment (TEMOVATE) 1.66 % Apply 1 application topically as needed.  . clonazePAM (KLONOPIN) 2 MG tablet Take 1.5 tablets by mouth at bedtime.  . cyanocobalamin (,VITAMIN B-12,) 1000 MCG/ML injection  Inject 1,000 mcg into the muscle every 30 (thirty) days.  Marland Kitchen esomeprazole (NEXIUM) 40 MG capsule Take 40 mg by mouth daily at 12 noon.  . estrogens, conjugated, (PREMARIN) 0.625 MG tablet Take 0.625 mg by mouth daily. Take daily for 21 days then do not take for 7 days.  Marland Kitchen levothyroxine (SYNTHROID, LEVOTHROID) 112 MCG tablet Take 1 tablet by mouth daily.  . Multiple Vitamin (MULTIVITAMIN) tablet Take 1 tablet by mouth daily.  . traMADol (ULTRAM) 50 MG tablet Take 1 tablet by mouth as needed.  . triamcinolone cream (KENALOG) 0.1 % Apply 1 application topically 2 (two) times daily.  Marland Kitchen ULORIC 40 MG tablet Take 1 tablet by mouth daily.  :  Review of Systems:  Out of a complete 14 point review of systems, all are reviewed and negative  with the exception of these symptoms as listed below:  Review of Systems  Constitutional: Positive for chills, appetite change and fatigue.  HENT: Positive for drooling, hearing loss and rhinorrhea.   Eyes: Negative.   Respiratory: Positive for shortness of breath.   Cardiovascular: Negative.   Gastrointestinal: Negative.   Endocrine: Positive for cold intolerance.  Genitourinary: Negative.   Musculoskeletal: Positive for back pain and gait problem.  Skin: Negative.   Allergic/Immunologic: Negative.   Neurological: Positive for weakness.       Memory loss  Hematological:       Anemia  Psychiatric/Behavioral: Positive for sleep disturbance (insomnia, restless leg, frequent waking, daytime sleepiness).    Objective:  Neurologic Exam  Physical Exam Physical Examination:   Filed Vitals:   01/02/14 0937  BP: 145/72  Pulse: 84  Temp: 97.6 F (36.4 C)    General Examination: The patient is a very pleasant 80 y.o. female in no acute distress. She appears frail and is not obese. She is well groomed.   HEENT: Normocephalic, atraumatic, pupils are equal, round and reactive to light and accommodation. Funduscopic exam is normal with sharp disc margins  noted. Extraocular tracking is good without limitation to gaze excursion or nystagmus noted. Normal smooth pursuit is noted. Hearing is grossly intact. Tympanic membranes are clear bilaterally. Face is symmetric with normal facial animation and normal facial sensation. Speech is clear with no dysarthria noted. There is no hypophonia. There is no lip, neck/head, jaw or voice tremor. Neck is supple with full range of passive and active motion. There are no carotid bruits on auscultation. Oropharynx exam reveals: mild mouth dryness, adequate dental hygiene and mild airway crowding, due to narrow airway entry and floppy appearing soft palate. Mallampati is class II. Tongue protrudes centrally and palate elevates symmetrically. Tonsils are absent.   Chest: Clear to auscultation without wheezing, rhonchi or crackles noted.  Heart: S1+S2+0, regular and normal without murmurs, rubs or gallops noted.   Abdomen: Soft, non-tender and non-distended with normal bowel sounds appreciated on auscultation.  Extremities: There is trace pitting edema in the distal lower extremities bilaterally. Pedal pulses are intact.  Skin: Warm and dry without trophic changes noted. There are no varicose veins.  Musculoskeletal: exam reveals changes consistent with arthritis in her hands, particularly her MCP joints. She has some bruising.  Neurologically:  Mental status: The patient is awake, alert and oriented in all 4 spheres. Her memory, attention, language and knowledge are appropriate. She has occasional difficulty giving a chronological or concise history and her daughter helps out with information. Her remote memory is good. There is no aphasia, agnosia, apraxia or anomia. Speech is clear with normal prosody and enunciation. Thought process is linear. Mood is congruent and affect is normal.  Cranial nerves are as described above under HEENT exam. In addition, shoulder shrug is normal with equal shoulder height noted. Motor  exam: Normal bulk, strength and tone is noted. There is no drift, tremor or rebound. Romberg is negative. Reflexes are 2+ throughout. Toes are downgoing bilaterally. Fine motor skills are intact with normal finger taps, normal hand movements, normal rapid alternating patting, normal foot taps and normal foot agility.  Cerebellar testing shows no dysmetria or intention tremor on finger to nose testing. Heel to shin bothers her R hip. There is no truncal or gait ataxia.  Sensory exam is intact to light touch.  Gait, station and balance: She stands up slowly and stands slightly wide-based. No veering to one side is  noted. No leaning to one side is noted. Posture is age-appropriate, but she has increase in kyphoscolis. She walks very slowly and cautiously, briefly without a cane. She turns cautiously.   Assessment and Plan:   In summary, Ladaisha Portillo Clabaugh is a very pleasant 80 year old female with an underlying medical history of diastolic heart failure, chronic cough, thyroid d/s, hyperlipidemia, asthma, reflux disease, degenerative spine disease, s/p lower back surgeries, and memory loss, who was diagnosed with OSA many years ago, and could not tolerate CPAP, but recent sleep study testing did not confirm any significant obstructive or central SDB. She has been on clonazepam, but I feel, that she is on too high a dose. I explained to them, that she may want to taper down gradually with her PCP. Also, she may try melatonin, which she has not tried. She may also be reasonable candidate to try Remeron, given her sleep maintenance issues and also loss of appetite and loss of weight reported. They are encouraged to discuss this with Dr. Dagmar Hait. She most likely has EDS d/t a combination of medication effect and poor sleep at night. She is reported to snack at irregular times and not always healthy snacks. She is advised to take tramadol at bedtime, instead in AM, to see if it helps her pain at night.   I can most likely  see her back on an as needed basis. I answered all their questions today and the patient and her daughter and son were in agreement.  Most of my 30 minute visit today was spent in counseling and coordination of care, reviewing test results and reviewing medication.

## 2014-03-11 ENCOUNTER — Emergency Department (HOSPITAL_COMMUNITY)
Admission: EM | Admit: 2014-03-11 | Discharge: 2014-03-12 | Disposition: A | Discharge status: Home / Self Care | Payer: Medicare HMO | Attending: Emergency Medicine | Admitting: Emergency Medicine

## 2014-03-11 ENCOUNTER — Encounter (HOSPITAL_COMMUNITY): Payer: Self-pay | Admitting: Emergency Medicine

## 2014-03-11 DIAGNOSIS — N183 Chronic kidney disease, stage 3 unspecified: Secondary | ICD-10-CM | POA: Insufficient documentation

## 2014-03-11 DIAGNOSIS — Z79899 Other long term (current) drug therapy: Secondary | ICD-10-CM | POA: Insufficient documentation

## 2014-03-11 DIAGNOSIS — M1990 Unspecified osteoarthritis, unspecified site: Secondary | ICD-10-CM | POA: Insufficient documentation

## 2014-03-11 DIAGNOSIS — E1149 Type 2 diabetes mellitus with other diabetic neurological complication: Secondary | ICD-10-CM | POA: Insufficient documentation

## 2014-03-11 DIAGNOSIS — J45909 Unspecified asthma, uncomplicated: Secondary | ICD-10-CM | POA: Insufficient documentation

## 2014-03-11 DIAGNOSIS — Z872 Personal history of diseases of the skin and subcutaneous tissue: Secondary | ICD-10-CM | POA: Insufficient documentation

## 2014-03-11 DIAGNOSIS — E0590 Thyrotoxicosis, unspecified without thyrotoxic crisis or storm: Secondary | ICD-10-CM | POA: Insufficient documentation

## 2014-03-11 DIAGNOSIS — Y93K1 Activity, walking an animal: Secondary | ICD-10-CM | POA: Insufficient documentation

## 2014-03-11 DIAGNOSIS — G4700 Insomnia, unspecified: Secondary | ICD-10-CM | POA: Insufficient documentation

## 2014-03-11 DIAGNOSIS — S39012A Strain of muscle, fascia and tendon of lower back, initial encounter: Secondary | ICD-10-CM

## 2014-03-11 DIAGNOSIS — Z8742 Personal history of other diseases of the female genital tract: Secondary | ICD-10-CM | POA: Insufficient documentation

## 2014-03-11 DIAGNOSIS — Z862 Personal history of diseases of the blood and blood-forming organs and certain disorders involving the immune mechanism: Secondary | ICD-10-CM | POA: Insufficient documentation

## 2014-03-11 DIAGNOSIS — R296 Repeated falls: Secondary | ICD-10-CM | POA: Insufficient documentation

## 2014-03-11 DIAGNOSIS — Y929 Unspecified place or not applicable: Secondary | ICD-10-CM | POA: Insufficient documentation

## 2014-03-11 DIAGNOSIS — Z780 Asymptomatic menopausal state: Secondary | ICD-10-CM | POA: Insufficient documentation

## 2014-03-11 DIAGNOSIS — K219 Gastro-esophageal reflux disease without esophagitis: Secondary | ICD-10-CM | POA: Insufficient documentation

## 2014-03-11 DIAGNOSIS — S7000XA Contusion of unspecified hip, initial encounter: Secondary | ICD-10-CM | POA: Insufficient documentation

## 2014-03-11 DIAGNOSIS — IMO0002 Reserved for concepts with insufficient information to code with codable children: Secondary | ICD-10-CM | POA: Insufficient documentation

## 2014-03-11 DIAGNOSIS — Z8669 Personal history of other diseases of the nervous system and sense organs: Secondary | ICD-10-CM | POA: Insufficient documentation

## 2014-03-11 DIAGNOSIS — W19XXXA Unspecified fall, initial encounter: Secondary | ICD-10-CM

## 2014-03-11 DIAGNOSIS — M109 Gout, unspecified: Secondary | ICD-10-CM | POA: Insufficient documentation

## 2014-03-11 DIAGNOSIS — S7002XA Contusion of left hip, initial encounter: Secondary | ICD-10-CM

## 2014-03-11 DIAGNOSIS — F4323 Adjustment disorder with mixed anxiety and depressed mood: Secondary | ICD-10-CM | POA: Insufficient documentation

## 2014-03-11 NOTE — ED Notes (Signed)
Pt states while attempting walk puppy this evening fell onto knees, pt c/o bilat knee pain, L hip pain, R buttock pain.

## 2014-03-12 ENCOUNTER — Emergency Department (HOSPITAL_COMMUNITY): Payer: Medicare HMO

## 2014-03-12 NOTE — ED Provider Notes (Signed)
CSN: [id number]     Arrival date & time 03/11/14  2135 History   First MD Initiated Contact with Patient 03/12/14 0159     Chief Complaint  Patient presents with  . Fall     (Consider location/radiation/quality/duration/timing/severity/associated sxs/prior Treatment) Patient is a 80 y.o. female presenting with fall. The history is provided by the patient.  Fall This is a new (walking her dog and it pulled her over causing her to fall on her left side in the grass) problem. The current episode started 1 to 2 hours ago. The problem occurs constantly. The problem has not changed since onset.Associated symptoms comments: Left hip and left shoulder blade pain.  No head injury or LOC.. The symptoms are aggravated by bending and twisting. Nothing relieves the symptoms. She has tried nothing for the symptoms. The treatment provided no relief.    Past Medical History  Diagnosis Date  . Palpitations   . Unspecified fall   . Altered mental status   . Other nonspecific finding on examination of urine   . Symptomatic menopausal or female climacteric states   . Insomnia, unspecified   . Contact dermatitis and other eczema, due to unspecified cause   . Type II or unspecified type diabetes mellitus with neurological manifestations, not stated as uncontrolled   . Dermatophytosis of nail   . Mastodynia   . Contact dermatitis and other eczema due to solvents   . Chronic kidney disease, stage III (moderate)   . Acute bronchitis   . Wheezing   . Pain in joint, pelvic region and thigh   . Other speech disturbance(784.59)   . Unspecified acute reaction to stress   . Other malaise and fatigue   . Spasm of muscle   . Blood in stool   . Other specified noninflammatory disorder of vagina   . Osteoarthrosis, unspecified whether generalized or localized, unspecified site   . Gout, unspecified   . Adjustment disorder with mixed anxiety and depressed mood   . Other vitamin B12 deficiency anemia   .  Obstructive sleep apnea (adult) (pediatric)   . Other dyspnea and respiratory abnormality   . Type II or unspecified type diabetes mellitus without mention of complication, not stated as uncontrolled   . Unspecified asthma(493.90)   . Esophageal reflux   . Other and unspecified hyperlipidemia   . Thyrotoxicosis without mention of goiter or other cause, without mention of thyrotoxic crisis or storm   . Unspecified essential hypertension    History reviewed. No pertinent past surgical history. Family History  Problem Relation Age of Onset  . Cancer Sister   . Osteoarthritis Mother   . Heart disease    . Stroke Sister    History  Substance Use Topics  . Smoking status: Never Smoker   . Smokeless tobacco: Not on file  . Alcohol Use: No   OB History   Grav Para Term Preterm Abortions TAB SAB Ect Mult Living                 Review of Systems  All other systems reviewed and are negative.      Allergies  Aspirin; Celecoxib; Diclofenac sodium; Ibuprofen; Naproxen; and Sulindac  Home Medications   Current Outpatient Rx  Name  Route  Sig  Dispense  Refill  . allopurinol (ZYLOPRIM) 100 MG tablet   Oral   Take 100 mg by mouth daily.          Marland Kitchen atorvastatin (LIPITOR) 20 MG tablet  Oral   Take 20 mg by mouth daily.          . Calcium Carbonate-Vitamin D (CALTRATE 600+D) 600-400 MG-UNIT per tablet   Oral   Take 1 tablet by mouth daily.         . clobetasol ointment (TEMOVATE) 0.05 %   Topical   Apply 1 application topically 2 (two) times daily as needed (rash).          . clonazePAM (KLONOPIN) 2 MG tablet   Oral   Take 2 mg by mouth at bedtime.          . cyanocobalamin (,VITAMIN B-12,) 1000 MCG/ML injection   Intramuscular   Inject 1,000 mcg into the muscle every 30 (thirty) days.         Marland Kitchen esomeprazole (NEXIUM) 40 MG capsule   Oral   Take 40 mg by mouth daily at 12 noon.         Marland Kitchen levothyroxine (SYNTHROID, LEVOTHROID) 112 MCG tablet   Oral    Take 112 mcg by mouth daily before breakfast.          . Melatonin 1 MG TABS   Oral   Take 1 mg by mouth at bedtime.         . mirtazapine (REMERON) 15 MG tablet   Oral   Take 15 mg by mouth at bedtime.          . Multiple Vitamin (MULTIVITAMIN) tablet   Oral   Take 1 tablet by mouth daily.         . traMADol (ULTRAM) 50 MG tablet   Oral   Take 1 tablet by mouth at bedtime.          . triamcinolone cream (KENALOG) 0.1 %   Topical   Apply 1 application topically 2 (two) times daily.          BP 151/64  Pulse 81  Temp(Src) 98.2 F (36.8 C) (Oral)  Resp 16  SpO2 99% Physical Exam  Nursing note and vitals reviewed. Constitutional: She is oriented to person, place, and time. She appears well-developed and well-nourished. No distress.  HENT:  Head: Normocephalic and atraumatic.  Mouth/Throat: Oropharynx is clear and moist.  Eyes: Conjunctivae and EOM are normal. Pupils are equal, round, and reactive to light.  Neck: Normal range of motion. Neck supple.  Cardiovascular: Normal rate, regular rhythm and intact distal pulses.   No murmur heard. Pulmonary/Chest: Effort normal and breath sounds normal. No respiratory distress. She has no wheezes. She has no rales.  Abdominal: Soft. She exhibits no distension. There is no tenderness. There is no rebound and no guarding.  Musculoskeletal: Normal range of motion. She exhibits no edema.       Left shoulder: She exhibits tenderness, pain and spasm. She exhibits normal range of motion, no bony tenderness, normal pulse and normal strength.       Left hip: She exhibits tenderness. She exhibits normal range of motion, normal strength, no swelling and no deformity.       Cervical back: Normal.       Thoracic back: Normal.       Lumbar back: Normal.       Arms:      Legs: Neurological: She is alert and oriented to person, place, and time.  Skin: Skin is warm and dry. No rash noted. No erythema.  Psychiatric: She has a normal  mood and affect. Her behavior is normal.    ED Course  Procedures (  including critical care time) Labs Review Labs Reviewed - No data to display Imaging Review Dg Hip Complete Left  03/12/2014   CLINICAL DATA:  History of recent fall complaining of left hip pain.  EXAM: LEFT HIP - COMPLETE 2+ VIEW  COMPARISON:  09/13/2010.  FINDINGS: AP view of the pelvis and AP and lateral views of the left hip demonstrate no definite acute displaced fracture, subluxation or dislocation. Joint space narrowing, subchondral sclerosis and osteophyte formation in the hip joints bilaterally, compatible with osteoarthritis.  IMPRESSION: 1. No acute radiographic abnormality of the bony pelvis or the left hip.   Electronically Signed   By: Trudie Reedaniel  Entrikin M.D.   On: 03/12/2014 02:28     EKG Interpretation None      MDM   Final diagnoses:  Fall  Contusion of left hip  Back strain    Patient with a mechanical today while walking her dog complaining of left hip pain left scapular pain. Denies head injury or LOC. Plain films of the hip are negative. Able to get patient out of bed and she is able to ambulate without difficulty with low suspicion for occult fracture at this time. Patient has tramadol for pain she will use when necessary and she was discharged home with family    Gwyneth SproutWhitney Hades Mathew, MD 03/12/14 [phone]

## 2014-07-23 ENCOUNTER — Ambulatory Visit (INDEPENDENT_AMBULATORY_CARE_PROVIDER_SITE_OTHER): Payer: Medicare HMO | Admitting: Podiatrist

## 2014-07-23 ENCOUNTER — Encounter: Payer: Self-pay | Admitting: Podiatrist

## 2014-07-23 VITALS — BP 122/86 | HR 82 | Resp 12

## 2014-07-23 DIAGNOSIS — M79673 Pain in unspecified foot: Secondary | ICD-10-CM

## 2014-07-23 DIAGNOSIS — M79609 Pain in unspecified limb: Secondary | ICD-10-CM

## 2014-07-23 DIAGNOSIS — B351 Tinea unguium: Secondary | ICD-10-CM

## 2014-07-23 NOTE — Patient Instructions (Signed)
Diabetes and Foot Care Diabetes may cause you to have problems because of poor blood supply (circulation) to your feet and legs. This may cause the skin on your feet to become thinner, break easier, and heal more slowly. Your skin may become dry, and the skin may peel and crack. You may also have nerve damage in your legs and feet causing decreased feeling in them. You may not notice minor injuries to your feet that could lead to infections or more serious problems. Taking care of your feet is one of the most important things you can do for yourself.  HOME CARE INSTRUCTIONS  Wear shoes at all times, even in the house. Do not go barefoot. Bare feet are easily injured.  Check your feet daily for blisters, cuts, and redness. If you cannot see the bottom of your feet, use a mirror or ask someone for help.  Wash your feet with warm water (do not use hot water) and mild soap. Then pat your feet and the areas between your toes until they are completely dry. Do not soak your feet as this can dry your skin.  Apply a moisturizing lotion or petroleum jelly (that does not contain alcohol and is unscented) to the skin on your feet and to dry, brittle toenails. Do not apply lotion between your toes.  Trim your toenails straight across. Do not dig under them or around the cuticle. File the edges of your nails with an emery board or nail file.  Do not cut corns or calluses or try to remove them with medicine.  Wear clean socks or stockings every day. Make sure they are not too tight. Do not wear knee-high stockings since they may decrease blood flow to your legs.  Wear shoes that fit properly and have enough cushioning. To break in new shoes, wear them for just a few hours a day. This prevents you from injuring your feet. Always look in your shoes before you put them on to be sure there are no objects inside.  Do not cross your legs. This may decrease the blood flow to your feet.  If you find a minor scrape,  cut, or break in the skin on your feet, keep it and the skin around it clean and dry. These areas may be cleansed with mild soap and water. Do not cleanse the area with peroxide, alcohol, or iodine.  When you remove an adhesive bandage, be sure not to damage the skin around it.  If you have a wound, look at it several times a day to make sure it is healing.  Do not use heating pads or hot water bottles. They may burn your skin. If you have lost feeling in your feet or legs, you may not know it is happening until it is too late.  Make sure your health care provider performs a complete foot exam at least annually or more often if you have foot problems. Report any cuts, sores, or bruises to your health care provider immediately. SEEK MEDICAL CARE IF:   You have an injury that is not healing.  You have cuts or breaks in the skin.  You have an ingrown nail.  You notice redness on your legs or feet.  You feel burning or tingling in your legs or feet.  You have pain or cramps in your legs and feet.  Your legs or feet are numb.  Your feet always feel cold. SEEK IMMEDIATE MEDICAL CARE IF:   There is increasing redness,   swelling, or pain in or around a wound.  There is a red line that goes up your leg.  Pus is coming from a wound.  You develop a fever or as directed by your health care provider.  You notice a bad smell coming from an ulcer or wound. Document Released: 12/01/2000 Document Revised: 08/06/2013 Document Reviewed: 05/13/2013 [employer] Patient Information 2015 [employer]. This information is not intended to replace advice given to you by your health care provider. Make sure you discuss any questions you have with your health care provider.  

## 2014-07-23 NOTE — Progress Notes (Signed)
   Subjective:    Patient ID: Jillian Wheeler, female    DOB: 05-Nov-1925, 80 y.o.   MRN: [id number]  HPI  PT STATED NEED TOENAILS TRIM.    Review of Systems  Musculoskeletal: Positive for gait problem.  All other systems reviewed and are negative.      Objective:   Physical Exam  GENERAL APPEARANCE: Alert, conversant. Appropriately groomed. No acute distress.  VASCULAR: Pedal pulses palpable at 1/4 DP and PT bilateral.  Capillary refill time is immediate to all digits,  Proximal to distal cooling it warm to warm.  Digital hair growth is present bilateral  NEUROLOGIC: sensation is decreased 5.07 monofilament at 3/5 sites bilateral.  Light touch is intact bilateral, vibratory sensation decreased bilateral, achilles tendon reflex is intact bilateral.  MUSCULOSKELETAL: acceptable muscle strength, tone and stability bilateral.  Intrinsic muscluature intact bilateral.  Rectus appearance of foot and digits noted bilateral.   DERMATOLOGIC: skin color, texture, and turger are within normal limits.  No preulcerative lesions are seen, no interdigital maceration noted.  No open lesions present.  Toenails are thickened, discolored, dystrophic, mycotic and painful 1 through 5 bilateral.    Assessment & Plan:  Symptomatic mycotic toenails  Debridement of the toenails was carried out today without complication. She'll be seen back in 3 months or as needed for followup.

## 2014-10-29 ENCOUNTER — Ambulatory Visit (INDEPENDENT_AMBULATORY_CARE_PROVIDER_SITE_OTHER): Payer: Commercial Managed Care - HMO | Admitting: Podiatrist

## 2014-10-29 ENCOUNTER — Encounter: Payer: Self-pay | Admitting: Podiatrist

## 2014-10-29 DIAGNOSIS — B351 Tinea unguium: Secondary | ICD-10-CM | POA: Diagnosis not present

## 2014-10-29 DIAGNOSIS — M79676 Pain in unspecified toe(s): Secondary | ICD-10-CM | POA: Diagnosis not present

## 2014-10-29 NOTE — Progress Notes (Signed)
   Subjective:    Patient ID: Jillian Wheeler, female    DOB: 1925-06-28, 80 y.o.   MRN: [id number]  HPI  PT STATED NEED TOENAILS TRIM.      Objective:   Physical Exam  GENERAL APPEARANCE: Alert, conversant. Appropriately groomed. No acute distress.  VASCULAR: Pedal pulses palpable at 1/4 DP and PT bilateral.  Capillary refill time is immediate to all digits,  Proximal to distal cooling it warm to warm.  Digital hair growth is present bilateral  NEUROLOGIC: sensation is decreased 5.07 monofilament at 3/5 sites bilateral.  Light touch is intact bilateral, vibratory sensation decreased bilateral, achilles tendon reflex is intact bilateral.  MUSCULOSKELETAL: acceptable muscle strength, tone and stability bilateral.  Intrinsic muscluature intact bilateral.  Rectus appearance of foot and digits noted bilateral.   DERMATOLOGIC: skin color, texture, and turger are within normal limits.  No preulcerative lesions are seen, no interdigital maceration noted.  No open lesions present.  Toenails are thickened, discolored, dystrophic, mycotic and painful 1 through 5 bilateral.    Assessment & Plan:  Symptomatic mycotic toenails  Debridement of the toenails was carried out today without complication. She'll be seen back in 3 months or as needed for followup.

## 2014-12-16 ENCOUNTER — Other Ambulatory Visit: Payer: Self-pay | Admitting: Internal Medicine

## 2014-12-16 DIAGNOSIS — R1031 Right lower quadrant pain: Secondary | ICD-10-CM

## 2014-12-16 DIAGNOSIS — R1032 Left lower quadrant pain: Principal | ICD-10-CM

## 2014-12-23 ENCOUNTER — Ambulatory Visit
Admission: RE | Admit: 2014-12-23 | Discharge: 2014-12-23 | Disposition: A | Discharge status: Home / Self Care | Payer: Commercial Managed Care - HMO | Source: Ambulatory Visit | Attending: Internal Medicine | Admitting: Internal Medicine

## 2014-12-23 DIAGNOSIS — R1032 Left lower quadrant pain: Principal | ICD-10-CM

## 2014-12-23 DIAGNOSIS — R1031 Right lower quadrant pain: Secondary | ICD-10-CM

## 2014-12-23 MED ORDER — IOHEXOL 300 MG/ML  SOLN
75.0000 mL | Freq: Once | INTRAMUSCULAR | Status: AC | PRN
  Administered 2014-12-23: 75 mL via INTRAVENOUS

## 2015-01-20 ENCOUNTER — Encounter: Payer: Self-pay | Admitting: Podiatrist

## 2015-01-20 ENCOUNTER — Ambulatory Visit (INDEPENDENT_AMBULATORY_CARE_PROVIDER_SITE_OTHER): Payer: Commercial Managed Care - HMO | Admitting: Podiatrist

## 2015-01-20 DIAGNOSIS — M79676 Pain in unspecified toe(s): Secondary | ICD-10-CM

## 2015-01-20 DIAGNOSIS — B351 Tinea unguium: Secondary | ICD-10-CM

## 2015-01-20 NOTE — Progress Notes (Signed)
   Subjective:    Patient ID: Jillian Wheeler, female    DOB: 03/28/1925, 81 y.o.   MRN: [id number]  HPI  PT STATED NEED TOENAILS TRIM.      Objective:   Physical Exam  GENERAL APPEARANCE: Alert, conversant. Appropriately groomed. No acute distress.  VASCULAR: Pedal pulses palpable at 1/4 DP and PT bilateral.  Capillary refill time is immediate to all digits,  Proximal to distal cooling it warm to warm.  Digital hair growth is present bilateral  NEUROLOGIC: sensation is decreased 5.07 monofilament at 3/5 sites bilateral.  Light touch is intact bilateral, vibratory sensation decreased bilateral, achilles tendon reflex is intact bilateral.  MUSCULOSKELETAL: acceptable muscle strength, tone and stability bilateral.  Intrinsic muscluature intact bilateral.  Rectus appearance of foot and digits noted bilateral.   DERMATOLOGIC: skin color, texture, and turger are within normal limits.  No preulcerative lesions are seen, no interdigital maceration noted.  No open lesions present.  Toenails are thickened, discolored, dystrophic, mycotic and painful 1 through 5 bilateral.    Assessment & Plan:  Symptomatic mycotic toenails  Debridement of the toenails was carried out today without complication. She'll be seen back in 3 months or as needed for followup. 

## 2015-01-20 NOTE — Patient Instructions (Signed)
Diabetes and Foot Care Diabetes may cause you to have problems because of poor blood supply (circulation) to your feet and legs. This may cause the skin on your feet to become thinner, break easier, and heal more slowly. Your skin may become dry, and the skin may peel and crack. You may also have nerve damage in your legs and feet causing decreased feeling in them. You may not notice minor injuries to your feet that could lead to infections or more serious problems. Taking care of your feet is one of the most important things you can do for yourself.  HOME CARE INSTRUCTIONS  Wear shoes at all times, even in the house. Do not go barefoot. Bare feet are easily injured.  Check your feet daily for blisters, cuts, and redness. If you cannot see the bottom of your feet, use a mirror or ask someone for help.  Wash your feet with warm water (do not use hot water) and mild soap. Then pat your feet and the areas between your toes until they are completely dry. Do not soak your feet as this can dry your skin.  Apply a moisturizing lotion or petroleum jelly (that does not contain alcohol and is unscented) to the skin on your feet and to dry, brittle toenails. Do not apply lotion between your toes.  Trim your toenails straight across. Do not dig under them or around the cuticle. File the edges of your nails with an emery board or nail file.  Do not cut corns or calluses or try to remove them with medicine.  Wear clean socks or stockings every day. Make sure they are not too tight. Do not wear knee-high stockings since they may decrease blood flow to your legs.  Wear shoes that fit properly and have enough cushioning. To break in new shoes, wear them for just a few hours a day. This prevents you from injuring your feet. Always look in your shoes before you put them on to be sure there are no objects inside.  Do not cross your legs. This may decrease the blood flow to your feet.  If you find a minor scrape,  cut, or break in the skin on your feet, keep it and the skin around it clean and dry. These areas may be cleansed with mild soap and water. Do not cleanse the area with peroxide, alcohol, or iodine.  When you remove an adhesive bandage, be sure not to damage the skin around it.  If you have a wound, look at it several times a day to make sure it is healing.  Do not use heating pads or hot water bottles. They may burn your skin. If you have lost feeling in your feet or legs, you may not know it is happening until it is too late.  Make sure your health care provider performs a complete foot exam at least annually or more often if you have foot problems. Report any cuts, sores, or bruises to your health care provider immediately. SEEK MEDICAL CARE IF:   You have an injury that is not healing.  You have cuts or breaks in the skin.  You have an ingrown nail.  You notice redness on your legs or feet.  You feel burning or tingling in your legs or feet.  You have pain or cramps in your legs and feet.  Your legs or feet are numb.  Your feet always feel cold. SEEK IMMEDIATE MEDICAL CARE IF:   There is increasing redness,   swelling, or pain in or around a wound.  There is a red line that goes up your leg.  Pus is coming from a wound.  You develop a fever or as directed by your health care provider.  You notice a bad smell coming from an ulcer or wound. Document Released: 12/01/2000 Document Revised: 08/06/2013 Document Reviewed: 05/13/2013 [employer] Patient Information 2015 [employer]. This information is not intended to replace advice given to you by your health care provider. Make sure you discuss any questions you have with your health care provider.  

## 2015-02-05 ENCOUNTER — Encounter (HOSPITAL_COMMUNITY): Payer: Self-pay | Admitting: Emergency Medicine

## 2015-02-05 ENCOUNTER — Emergency Department (HOSPITAL_COMMUNITY): Payer: Commercial Managed Care - HMO

## 2015-02-05 ENCOUNTER — Emergency Department (HOSPITAL_COMMUNITY)
Admission: EM | Admit: 2015-02-05 | Discharge: 2015-02-05 | Disposition: A | Discharge status: Home / Self Care | Payer: Commercial Managed Care - HMO | Attending: Emergency Medicine | Admitting: Emergency Medicine

## 2015-02-05 DIAGNOSIS — E785 Hyperlipidemia, unspecified: Secondary | ICD-10-CM | POA: Insufficient documentation

## 2015-02-05 DIAGNOSIS — S161XXA Strain of muscle, fascia and tendon at neck level, initial encounter: Secondary | ICD-10-CM | POA: Diagnosis not present

## 2015-02-05 DIAGNOSIS — E0590 Thyrotoxicosis, unspecified without thyrotoxic crisis or storm: Secondary | ICD-10-CM | POA: Diagnosis not present

## 2015-02-05 DIAGNOSIS — M109 Gout, unspecified: Secondary | ICD-10-CM | POA: Insufficient documentation

## 2015-02-05 DIAGNOSIS — F4323 Adjustment disorder with mixed anxiety and depressed mood: Secondary | ICD-10-CM | POA: Diagnosis not present

## 2015-02-05 DIAGNOSIS — M810 Age-related osteoporosis without current pathological fracture: Secondary | ICD-10-CM | POA: Insufficient documentation

## 2015-02-05 DIAGNOSIS — I129 Hypertensive chronic kidney disease with stage 1 through stage 4 chronic kidney disease, or unspecified chronic kidney disease: Secondary | ICD-10-CM | POA: Diagnosis not present

## 2015-02-05 DIAGNOSIS — S0990XA Unspecified injury of head, initial encounter: Secondary | ICD-10-CM

## 2015-02-05 DIAGNOSIS — G4700 Insomnia, unspecified: Secondary | ICD-10-CM | POA: Insufficient documentation

## 2015-02-05 DIAGNOSIS — K219 Gastro-esophageal reflux disease without esophagitis: Secondary | ICD-10-CM | POA: Insufficient documentation

## 2015-02-05 DIAGNOSIS — E119 Type 2 diabetes mellitus without complications: Secondary | ICD-10-CM | POA: Diagnosis not present

## 2015-02-05 DIAGNOSIS — S299XXA Unspecified injury of thorax, initial encounter: Secondary | ICD-10-CM | POA: Insufficient documentation

## 2015-02-05 DIAGNOSIS — Z79899 Other long term (current) drug therapy: Secondary | ICD-10-CM | POA: Insufficient documentation

## 2015-02-05 DIAGNOSIS — Z9181 History of falling: Secondary | ICD-10-CM | POA: Diagnosis not present

## 2015-02-05 DIAGNOSIS — S46912A Strain of unspecified muscle, fascia and tendon at shoulder and upper arm level, left arm, initial encounter: Secondary | ICD-10-CM | POA: Diagnosis not present

## 2015-02-05 DIAGNOSIS — Y9389 Activity, other specified: Secondary | ICD-10-CM | POA: Insufficient documentation

## 2015-02-05 DIAGNOSIS — J45909 Unspecified asthma, uncomplicated: Secondary | ICD-10-CM | POA: Diagnosis not present

## 2015-02-05 DIAGNOSIS — Y92410 Unspecified street and highway as the place of occurrence of the external cause: Secondary | ICD-10-CM | POA: Diagnosis not present

## 2015-02-05 DIAGNOSIS — Y998 Other external cause status: Secondary | ICD-10-CM | POA: Insufficient documentation

## 2015-02-05 DIAGNOSIS — S46812A Strain of other muscles, fascia and tendons at shoulder and upper arm level, left arm, initial encounter: Secondary | ICD-10-CM

## 2015-02-05 DIAGNOSIS — S4992XA Unspecified injury of left shoulder and upper arm, initial encounter: Secondary | ICD-10-CM | POA: Diagnosis present

## 2015-02-05 DIAGNOSIS — Z872 Personal history of diseases of the skin and subcutaneous tissue: Secondary | ICD-10-CM | POA: Insufficient documentation

## 2015-02-05 DIAGNOSIS — N183 Chronic kidney disease, stage 3 (moderate): Secondary | ICD-10-CM | POA: Insufficient documentation

## 2015-02-05 MED ORDER — ONDANSETRON 4 MG PO TBDP
4.0000 mg | ORAL_TABLET | Freq: Once | ORAL | Status: AC
  Administered 2015-02-05: 4 mg via ORAL
  Filled 2015-02-05: qty 1

## 2015-02-05 MED ORDER — TRAMADOL HCL 50 MG PO TABS
100.0000 mg | ORAL_TABLET | Freq: Once | ORAL | Status: AC
  Administered 2015-02-05: 100 mg via ORAL
  Filled 2015-02-05: qty 2
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
## 2015-03-02 ENCOUNTER — Encounter (HOSPITAL_COMMUNITY): Payer: Self-pay

## 2015-03-02 ENCOUNTER — Emergency Department (HOSPITAL_COMMUNITY): Payer: Commercial Managed Care - HMO

## 2015-03-02 ENCOUNTER — Inpatient Hospital Stay (HOSPITAL_COMMUNITY)
Admission: EM | Admit: 2015-03-02 | Discharge: 2015-03-08 | DRG: 644 | Disposition: A | Discharge status: Skilled Nursing Facility | Payer: Commercial Managed Care - HMO | Attending: Internal Medicine | Admitting: Internal Medicine

## 2015-03-02 DIAGNOSIS — I5032 Chronic diastolic (congestive) heart failure: Secondary | ICD-10-CM | POA: Diagnosis present

## 2015-03-02 DIAGNOSIS — B9620 Unspecified Escherichia coli [E. coli] as the cause of diseases classified elsewhere: Secondary | ICD-10-CM | POA: Diagnosis present

## 2015-03-02 DIAGNOSIS — R935 Abnormal findings on diagnostic imaging of other abdominal regions, including retroperitoneum: Secondary | ICD-10-CM

## 2015-03-02 DIAGNOSIS — R05 Cough: Secondary | ICD-10-CM

## 2015-03-02 DIAGNOSIS — E871 Hypo-osmolality and hyponatremia: Secondary | ICD-10-CM

## 2015-03-02 DIAGNOSIS — E222 Syndrome of inappropriate secretion of antidiuretic hormone: Secondary | ICD-10-CM | POA: Diagnosis not present

## 2015-03-02 DIAGNOSIS — R296 Repeated falls: Secondary | ICD-10-CM | POA: Diagnosis present

## 2015-03-02 DIAGNOSIS — R410 Disorientation, unspecified: Secondary | ICD-10-CM | POA: Diagnosis not present

## 2015-03-02 DIAGNOSIS — N390 Urinary tract infection, site not specified: Secondary | ICD-10-CM

## 2015-03-02 DIAGNOSIS — E785 Hyperlipidemia, unspecified: Secondary | ICD-10-CM | POA: Diagnosis present

## 2015-03-02 DIAGNOSIS — W19XXXA Unspecified fall, initial encounter: Secondary | ICD-10-CM

## 2015-03-02 DIAGNOSIS — K219 Gastro-esophageal reflux disease without esophagitis: Secondary | ICD-10-CM | POA: Diagnosis present

## 2015-03-02 DIAGNOSIS — M109 Gout, unspecified: Secondary | ICD-10-CM | POA: Diagnosis present

## 2015-03-02 DIAGNOSIS — F4323 Adjustment disorder with mixed anxiety and depressed mood: Secondary | ICD-10-CM | POA: Diagnosis present

## 2015-03-02 DIAGNOSIS — Z66 Do not resuscitate: Secondary | ICD-10-CM | POA: Diagnosis present

## 2015-03-02 DIAGNOSIS — Z823 Family history of stroke: Secondary | ICD-10-CM

## 2015-03-02 DIAGNOSIS — D649 Anemia, unspecified: Secondary | ICD-10-CM | POA: Diagnosis present

## 2015-03-02 DIAGNOSIS — J45909 Unspecified asthma, uncomplicated: Secondary | ICD-10-CM | POA: Diagnosis present

## 2015-03-02 DIAGNOSIS — D519 Vitamin B12 deficiency anemia, unspecified: Secondary | ICD-10-CM | POA: Diagnosis present

## 2015-03-02 DIAGNOSIS — Z809 Family history of malignant neoplasm, unspecified: Secondary | ICD-10-CM

## 2015-03-02 DIAGNOSIS — I129 Hypertensive chronic kidney disease with stage 1 through stage 4 chronic kidney disease, or unspecified chronic kidney disease: Secondary | ICD-10-CM | POA: Diagnosis present

## 2015-03-02 DIAGNOSIS — K5900 Constipation, unspecified: Secondary | ICD-10-CM | POA: Diagnosis present

## 2015-03-02 DIAGNOSIS — R059 Cough, unspecified: Secondary | ICD-10-CM

## 2015-03-02 DIAGNOSIS — E1149 Type 2 diabetes mellitus with other diabetic neurological complication: Secondary | ICD-10-CM | POA: Diagnosis present

## 2015-03-02 DIAGNOSIS — E876 Hypokalemia: Secondary | ICD-10-CM | POA: Diagnosis present

## 2015-03-02 DIAGNOSIS — N183 Chronic kidney disease, stage 3 (moderate): Secondary | ICD-10-CM | POA: Diagnosis present

## 2015-03-02 DIAGNOSIS — G4733 Obstructive sleep apnea (adult) (pediatric): Secondary | ICD-10-CM | POA: Diagnosis present

## 2015-03-02 DIAGNOSIS — M1990 Unspecified osteoarthritis, unspecified site: Secondary | ICD-10-CM | POA: Diagnosis present

## 2015-03-02 LAB — TYPE AND SCREEN
ABO/RH(D): O POS
Antibody Screen: NEGATIVE

## 2015-03-02 LAB — CBC WITH DIFFERENTIAL/PLATELET
Basophils Absolute: 0 10*3/uL (ref 0.0–0.1)
Basophils Relative: 0 % (ref 0–1)
EOS PCT: 0 % (ref 0–5)
Eosinophils Absolute: 0 10*3/uL (ref 0.0–0.7)
HCT: 30 % — ABNORMAL LOW (ref 36.0–46.0)
HEMOGLOBIN: 10.9 g/dL — AB (ref 12.0–15.0)
LYMPHS ABS: 2 10*3/uL (ref 0.7–4.0)
Lymphocytes Relative: 18 % (ref 12–46)
MCH: 35 pg — ABNORMAL HIGH (ref 26.0–34.0)
MCHC: 36.3 g/dL — ABNORMAL HIGH (ref 30.0–36.0)
MCV: 96.5 fL (ref 78.0–100.0)
MONOS PCT: 8 % (ref 3–12)
Monocytes Absolute: 0.9 10*3/uL (ref 0.1–1.0)
NEUTROS ABS: 8 10*3/uL — AB (ref 1.7–7.7)
Neutrophils Relative %: 74 % (ref 43–77)
Platelets: 290 10*3/uL (ref 150–400)
RBC: 3.11 MIL/uL — ABNORMAL LOW (ref 3.87–5.11)
RDW: 12.3 % (ref 11.5–15.5)
WBC: 10.9 10*3/uL — AB (ref 4.0–10.5)

## 2015-03-02 LAB — BASIC METABOLIC PANEL
Anion gap: 9 (ref 5–15)
BUN: 19 mg/dL (ref 6–23)
CO2: 23 mmol/L (ref 19–32)
Calcium: 9.8 mg/dL (ref 8.4–10.5)
Chloride: 82 mmol/L — ABNORMAL LOW (ref 96–112)
Creatinine, Ser: 1.11 mg/dL — ABNORMAL HIGH (ref 0.50–1.10)
GFR calc Af Amer: 50 mL/min — ABNORMAL LOW (ref >90)
GFR calc non Af Amer: 43 mL/min — ABNORMAL LOW (ref >90)
GLUCOSE: 115 mg/dL — AB (ref 70–99)
Potassium: 3.4 mmol/L — ABNORMAL LOW (ref 3.5–5.1)
Sodium: 114 mmol/L — CL (ref 135–145)

## 2015-03-02 LAB — PROTIME-INR
INR: 0.96 (ref 0.00–1.49)
Prothrombin Time: 12.9 seconds (ref 11.6–15.2)

## 2015-03-02 LAB — ABO/RH: ABO/RH(D): O POS

## 2015-03-02 MED ORDER — SODIUM CHLORIDE 0.9 % IV SOLN
1000.0000 mL | INTRAVENOUS | Status: DC
  Administered 2015-03-02: 1000 mL via INTRAVENOUS

## 2015-03-02 MED ORDER — FENTANYL CITRATE 0.05 MG/ML IJ SOLN
50.0000 ug | INTRAMUSCULAR | Status: AC | PRN
  Administered 2015-03-02 – 2015-03-03 (×2): 50 ug via INTRAVENOUS
  Filled 2015-03-02 (×2): qty 2

## 2015-03-02 MED ORDER — ONDANSETRON HCL 4 MG/2ML IJ SOLN
4.0000 mg | Freq: Once | INTRAMUSCULAR | Status: AC | PRN
  Administered 2015-03-02: 4 mg via INTRAVENOUS
  Filled 2015-03-02: qty 2

## 2015-03-02 NOTE — ED Notes (Signed)
Patient had fall at 1900 this evening. Patient states right hip and left hip pain and lower back pain. Patient is A&OX4 at this time. Denies LOC.

## 2015-03-02 NOTE — ED Notes (Signed)
+  pulses to bilateral lower extremities. Pulses checked and marked at this time
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
## 2015-03-02 NOTE — ED Notes (Signed)
Bed: [id number] Expected date:  Expected time:  Means of arrival:  Comments: EMS 76F R hip pain s/p fall, no obvious signs of injury

## 2015-03-03 ENCOUNTER — Encounter (HOSPITAL_COMMUNITY): Payer: Self-pay | Admitting: Internal Medicine

## 2015-03-03 ENCOUNTER — Emergency Department (HOSPITAL_COMMUNITY): Payer: Commercial Managed Care - HMO

## 2015-03-03 ENCOUNTER — Other Ambulatory Visit (HOSPITAL_COMMUNITY): Payer: Self-pay

## 2015-03-03 ENCOUNTER — Inpatient Hospital Stay (HOSPITAL_COMMUNITY): Payer: Commercial Managed Care - HMO

## 2015-03-03 DIAGNOSIS — K5900 Constipation, unspecified: Secondary | ICD-10-CM | POA: Diagnosis present

## 2015-03-03 DIAGNOSIS — N183 Chronic kidney disease, stage 3 (moderate): Secondary | ICD-10-CM | POA: Diagnosis present

## 2015-03-03 DIAGNOSIS — I5032 Chronic diastolic (congestive) heart failure: Secondary | ICD-10-CM

## 2015-03-03 DIAGNOSIS — F4323 Adjustment disorder with mixed anxiety and depressed mood: Secondary | ICD-10-CM | POA: Diagnosis present

## 2015-03-03 DIAGNOSIS — N390 Urinary tract infection, site not specified: Secondary | ICD-10-CM | POA: Diagnosis present

## 2015-03-03 DIAGNOSIS — E785 Hyperlipidemia, unspecified: Secondary | ICD-10-CM | POA: Diagnosis present

## 2015-03-03 DIAGNOSIS — M1990 Unspecified osteoarthritis, unspecified site: Secondary | ICD-10-CM | POA: Diagnosis present

## 2015-03-03 DIAGNOSIS — E876 Hypokalemia: Secondary | ICD-10-CM

## 2015-03-03 DIAGNOSIS — B9620 Unspecified Escherichia coli [E. coli] as the cause of diseases classified elsewhere: Secondary | ICD-10-CM | POA: Diagnosis present

## 2015-03-03 DIAGNOSIS — E1149 Type 2 diabetes mellitus with other diabetic neurological complication: Secondary | ICD-10-CM | POA: Diagnosis present

## 2015-03-03 DIAGNOSIS — Z823 Family history of stroke: Secondary | ICD-10-CM | POA: Diagnosis not present

## 2015-03-03 DIAGNOSIS — D649 Anemia, unspecified: Secondary | ICD-10-CM | POA: Diagnosis present

## 2015-03-03 DIAGNOSIS — E222 Syndrome of inappropriate secretion of antidiuretic hormone: Secondary | ICD-10-CM | POA: Diagnosis present

## 2015-03-03 DIAGNOSIS — Z66 Do not resuscitate: Secondary | ICD-10-CM | POA: Diagnosis present

## 2015-03-03 DIAGNOSIS — K219 Gastro-esophageal reflux disease without esophagitis: Secondary | ICD-10-CM | POA: Diagnosis present

## 2015-03-03 DIAGNOSIS — E871 Hypo-osmolality and hyponatremia: Secondary | ICD-10-CM | POA: Diagnosis present

## 2015-03-03 DIAGNOSIS — M109 Gout, unspecified: Secondary | ICD-10-CM | POA: Diagnosis present

## 2015-03-03 DIAGNOSIS — R410 Disorientation, unspecified: Secondary | ICD-10-CM | POA: Diagnosis present

## 2015-03-03 DIAGNOSIS — Z809 Family history of malignant neoplasm, unspecified: Secondary | ICD-10-CM | POA: Diagnosis not present

## 2015-03-03 DIAGNOSIS — D519 Vitamin B12 deficiency anemia, unspecified: Secondary | ICD-10-CM | POA: Diagnosis present

## 2015-03-03 DIAGNOSIS — R296 Repeated falls: Secondary | ICD-10-CM | POA: Diagnosis present

## 2015-03-03 DIAGNOSIS — I129 Hypertensive chronic kidney disease with stage 1 through stage 4 chronic kidney disease, or unspecified chronic kidney disease: Secondary | ICD-10-CM | POA: Diagnosis present

## 2015-03-03 DIAGNOSIS — J45909 Unspecified asthma, uncomplicated: Secondary | ICD-10-CM | POA: Diagnosis present

## 2015-03-03 DIAGNOSIS — G4733 Obstructive sleep apnea (adult) (pediatric): Secondary | ICD-10-CM | POA: Diagnosis present

## 2015-03-03 LAB — BASIC METABOLIC PANEL
ANION GAP: 7 (ref 5–15)
Anion gap: 8 (ref 5–15)
Anion gap: 5 (ref 5–15)
Anion gap: 6 (ref 5–15)
BUN: 14 mg/dL (ref 6–23)
BUN: 11 mg/dL (ref 6–23)
BUN: 12 mg/dL (ref 6–23)
BUN: 13 mg/dL (ref 6–23)
CALCIUM: 8.6 mg/dL (ref 8.4–10.5)
CALCIUM: 8 mg/dL — AB (ref 8.4–10.5)
CHLORIDE: 90 mmol/L — AB (ref 96–112)
CHLORIDE: 93 mmol/L — AB (ref 96–112)
CO2: 21 mmol/L (ref 19–32)
CO2: 20 mmol/L (ref 19–32)
CO2: 22 mmol/L (ref 19–32)
CO2: 20 mmol/L (ref 19–32)
CREATININE: 0.92 mg/dL (ref 0.50–1.10)
CREATININE: 0.91 mg/dL (ref 0.50–1.10)
CREATININE: 0.86 mg/dL (ref 0.50–1.10)
Calcium: 8.4 mg/dL (ref 8.4–10.5)
Calcium: 8 mg/dL — ABNORMAL LOW (ref 8.4–10.5)
Chloride: 88 mmol/L — ABNORMAL LOW (ref 96–112)
Chloride: 93 mmol/L — ABNORMAL LOW (ref 96–112)
Creatinine, Ser: 0.92 mg/dL (ref 0.50–1.10)
GFR calc Af Amer: 63 mL/min — ABNORMAL LOW (ref >90)
GFR calc non Af Amer: 55 mL/min — ABNORMAL LOW (ref >90)
GFR, EST AFRICAN AMERICAN: 63 mL/min — AB (ref >90)
GFR, EST AFRICAN AMERICAN: 64 mL/min — AB (ref >90)
GFR, EST AFRICAN AMERICAN: 68 mL/min — AB (ref >90)
GFR, EST NON AFRICAN AMERICAN: 54 mL/min — AB (ref >90)
GFR, EST NON AFRICAN AMERICAN: 54 mL/min — AB (ref >90)
GFR, EST NON AFRICAN AMERICAN: 59 mL/min — AB (ref >90)
GLUCOSE: 121 mg/dL — AB (ref 70–99)
Glucose, Bld: 125 mg/dL — ABNORMAL HIGH (ref 70–99)
Glucose, Bld: 169 mg/dL — ABNORMAL HIGH (ref 70–99)
Glucose, Bld: 111 mg/dL — ABNORMAL HIGH (ref 70–99)
POTASSIUM: 4.2 mmol/L (ref 3.5–5.1)
Potassium: 3 mmol/L — ABNORMAL LOW (ref 3.5–5.1)
Potassium: 3.3 mmol/L — ABNORMAL LOW (ref 3.5–5.1)
Potassium: 3.7 mmol/L (ref 3.5–5.1)
Sodium: 117 mmol/L — CL (ref 135–145)
Sodium: 117 mmol/L — CL (ref 135–145)
Sodium: 120 mmol/L — ABNORMAL LOW (ref 135–145)
Sodium: 119 mmol/L — CL (ref 135–145)

## 2015-03-03 LAB — COMPREHENSIVE METABOLIC PANEL
ALK PHOS: 67 U/L (ref 39–117)
ALT: 23 U/L (ref 0–35)
ANION GAP: 9 (ref 5–15)
AST: 28 U/L (ref 0–37)
Albumin: 3.1 g/dL — ABNORMAL LOW (ref 3.5–5.2)
BILIRUBIN TOTAL: 0.4 mg/dL (ref 0.3–1.2)
BUN: 14 mg/dL (ref 6–23)
CHLORIDE: 88 mmol/L — AB (ref 96–112)
CO2: 22 mmol/L (ref 19–32)
Calcium: 8.5 mg/dL (ref 8.4–10.5)
Creatinine, Ser: 0.88 mg/dL (ref 0.50–1.10)
GFR, EST AFRICAN AMERICAN: 67 mL/min — AB (ref >90)
GFR, EST NON AFRICAN AMERICAN: 57 mL/min — AB (ref >90)
Glucose, Bld: 128 mg/dL — ABNORMAL HIGH (ref 70–99)
Potassium: 2.9 mmol/L — ABNORMAL LOW (ref 3.5–5.1)
Sodium: 119 mmol/L — CL (ref 135–145)
Total Protein: 5.8 g/dL — ABNORMAL LOW (ref 6.0–8.3)

## 2015-03-03 LAB — IRON AND TIBC
Iron: 79 ug/dL (ref 42–145)
Saturation Ratios: 34 % (ref 20–55)
TIBC: 231 ug/dL — AB (ref 250–470)
UIBC: 152 ug/dL (ref 125–400)

## 2015-03-03 LAB — HEPATIC FUNCTION PANEL
ALT: 21 U/L (ref 0–35)
AST: 26 U/L (ref 0–37)
Albumin: 3.2 g/dL — ABNORMAL LOW (ref 3.5–5.2)
Alkaline Phosphatase: 67 U/L (ref 39–117)
BILIRUBIN DIRECT: 0.2 mg/dL (ref 0.0–0.5)
Indirect Bilirubin: 0.5 mg/dL (ref 0.3–0.9)
Total Bilirubin: 0.7 mg/dL (ref 0.3–1.2)
Total Protein: 5.8 g/dL — ABNORMAL LOW (ref 6.0–8.3)

## 2015-03-03 LAB — OSMOLALITY, URINE: OSMOLALITY UR: 343 mosm/kg — AB (ref 390–1090)

## 2015-03-03 LAB — CBG MONITORING, ED
Glucose-Capillary: 124 mg/dL — ABNORMAL HIGH (ref 70–99)
Glucose-Capillary: 154 mg/dL — ABNORMAL HIGH (ref 70–99)

## 2015-03-03 LAB — TSH: TSH: 0.423 u[IU]/mL (ref 0.350–4.500)

## 2015-03-03 LAB — CBC
HCT: 25.8 % — ABNORMAL LOW (ref 36.0–46.0)
HCT: 26.8 % — ABNORMAL LOW (ref 36.0–46.0)
Hemoglobin: 9.5 g/dL — ABNORMAL LOW (ref 12.0–15.0)
Hemoglobin: 9.8 g/dL — ABNORMAL LOW (ref 12.0–15.0)
MCH: 35.5 pg — ABNORMAL HIGH (ref 26.0–34.0)
MCH: 35.7 pg — ABNORMAL HIGH (ref 26.0–34.0)
MCHC: 36.6 g/dL — ABNORMAL HIGH (ref 30.0–36.0)
MCHC: 36.8 g/dL — ABNORMAL HIGH (ref 30.0–36.0)
MCV: 97 fL (ref 78.0–100.0)
MCV: 97.1 fL (ref 78.0–100.0)
PLATELETS: 231 10*3/uL (ref 150–400)
PLATELETS: 270 10*3/uL (ref 150–400)
RBC: 2.66 MIL/uL — AB (ref 3.87–5.11)
RBC: 2.76 MIL/uL — ABNORMAL LOW (ref 3.87–5.11)
RDW: 12.7 % (ref 11.5–15.5)
RDW: 12.4 % (ref 11.5–15.5)
WBC: 11.1 10*3/uL — AB (ref 4.0–10.5)
WBC: 11.3 10*3/uL — AB (ref 4.0–10.5)

## 2015-03-03 LAB — PHOSPHORUS: Phosphorus: 2.1 mg/dL — ABNORMAL LOW (ref 2.3–4.6)

## 2015-03-03 LAB — RETICULOCYTES
RBC.: 2.66 MIL/uL — ABNORMAL LOW (ref 3.87–5.11)
RETIC CT PCT: 1.4 % (ref 0.4–3.1)
Retic Count, Absolute: 37.2 10*3/uL (ref 19.0–186.0)

## 2015-03-03 LAB — GLUCOSE, CAPILLARY
GLUCOSE-CAPILLARY: 119 mg/dL — AB (ref 70–99)
Glucose-Capillary: 104 mg/dL — ABNORMAL HIGH (ref 70–99)

## 2015-03-03 LAB — FERRITIN: Ferritin: 197 ng/mL (ref 10–291)

## 2015-03-03 LAB — URINALYSIS, ROUTINE W REFLEX MICROSCOPIC
Bilirubin Urine: NEGATIVE
Glucose, UA: NEGATIVE mg/dL
HGB URINE DIPSTICK: NEGATIVE
Ketones, ur: NEGATIVE mg/dL
Nitrite: NEGATIVE
Protein, ur: 30 mg/dL — AB
SPECIFIC GRAVITY, URINE: 1.013 (ref 1.005–1.030)
UROBILINOGEN UA: 0.2 mg/dL (ref 0.0–1.0)
pH: 6 (ref 5.0–8.0)

## 2015-03-03 LAB — URINE MICROSCOPIC-ADD ON

## 2015-03-03 LAB — NA AND K (SODIUM & POTASSIUM), RAND UR
Potassium Urine: 24 mmol/L
Sodium, Ur: 66 mmol/L

## 2015-03-03 LAB — MAGNESIUM: MAGNESIUM: 1.5 mg/dL (ref 1.5–2.5)

## 2015-03-03 LAB — FOLATE: Folate: >20 ng/mL

## 2015-03-03 LAB — POC OCCULT BLOOD, ED: FECAL OCCULT BLD: NEGATIVE

## 2015-03-03 LAB — SODIUM, URINE, RANDOM: Sodium, Ur: 68 mEq/L

## 2015-03-03 LAB — CREATININE, URINE, RANDOM: Creatinine, Urine: 67.7 mg/dL

## 2015-03-03 LAB — OSMOLALITY: Osmolality: 248 mOsm/kg — ABNORMAL LOW (ref 275–300)

## 2015-03-03 LAB — VITAMIN B12: Vitamin B-12: 1563 pg/mL — ABNORMAL HIGH (ref 211–911)

## 2015-03-03 MED ORDER — POTASSIUM CHLORIDE CRYS ER 20 MEQ PO TBCR
20.0000 meq | EXTENDED_RELEASE_TABLET | Freq: Once | ORAL | Status: AC
  Administered 2015-03-03: 20 meq via ORAL
  Filled 2015-03-03: qty 1

## 2015-03-03 MED ORDER — POLYETHYLENE GLYCOL 3350 17 G PO PACK
17.0000 g | PACK | Freq: Every day | ORAL | Status: DC
  Administered 2015-03-04 – 2015-03-08 (×5): 17 g via ORAL
  Filled 2015-03-03 (×6): qty 1

## 2015-03-03 MED ORDER — INSULIN ASPART 100 UNIT/ML ~~LOC~~ SOLN: 0.0000 [IU] | Freq: Every day | SUBCUTANEOUS | Status: DC

## 2015-03-03 MED ORDER — ACETAMINOPHEN 325 MG PO TABS
650.0000 mg | ORAL_TABLET | Freq: Four times a day (QID) | ORAL | Status: DC | PRN
  Administered 2015-03-03 – 2015-03-08 (×3): 650 mg via ORAL
  Filled 2015-03-03 (×4): qty 2

## 2015-03-03 MED ORDER — SODIUM CHLORIDE 0.9 % IJ SOLN
3.0000 mL | Freq: Two times a day (BID) | INTRAMUSCULAR | Status: DC
  Administered 2015-03-03 – 2015-03-05 (×2): 3 mL via INTRAVENOUS

## 2015-03-03 MED ORDER — TRAMADOL HCL 50 MG PO TABS
50.0000 mg | ORAL_TABLET | Freq: Three times a day (TID) | ORAL | Status: DC | PRN
  Administered 2015-03-03 – 2015-03-08 (×7): 50 mg via ORAL
  Filled 2015-03-03 (×7): qty 1

## 2015-03-03 MED ORDER — HYDROCODONE-ACETAMINOPHEN 5-325 MG PO TABS
1.0000 | ORAL_TABLET | Freq: Every day | ORAL | Status: DC
  Administered 2015-03-04 – 2015-03-08 (×5): 1 via ORAL
  Filled 2015-03-03 (×5): qty 1

## 2015-03-03 MED ORDER — ENOXAPARIN SODIUM 40 MG/0.4ML ~~LOC~~ SOLN
40.0000 mg | SUBCUTANEOUS | Status: DC
  Administered 2015-03-03 – 2015-03-08 (×6): 40 mg via SUBCUTANEOUS
  Filled 2015-03-03 (×6): qty 0.4

## 2015-03-03 MED ORDER — SODIUM CHLORIDE 0.9 % IV SOLN
1000.0000 mL | INTRAVENOUS | Status: AC
  Administered 2015-03-03: 1000 mL via INTRAVENOUS

## 2015-03-03 MED ORDER — INSULIN ASPART 100 UNIT/ML ~~LOC~~ SOLN
0.0000 [IU] | Freq: Three times a day (TID) | SUBCUTANEOUS | Status: DC
  Administered 2015-03-05 – 2015-03-07 (×3): 1 [IU] via SUBCUTANEOUS
  Filled 2015-03-03: qty 1

## 2015-03-03 MED ORDER — ALLOPURINOL 100 MG PO TABS
100.0000 mg | ORAL_TABLET | Freq: Every day | ORAL | Status: DC
  Administered 2015-03-03 – 2015-03-08 (×6): 100 mg via ORAL
  Filled 2015-03-03 (×6): qty 1

## 2015-03-03 MED ORDER — CLONAZEPAM 1 MG PO TABS: 1.0000 mg | ORAL_TABLET | ORAL | Status: DC

## 2015-03-03 MED ORDER — FLEET ENEMA 7-19 GM/118ML RE ENEM: 1.0000 | ENEMA | Freq: Once | RECTAL | Status: AC | PRN

## 2015-03-03 MED ORDER — ONDANSETRON HCL 4 MG/2ML IJ SOLN: 4.0000 mg | Freq: Four times a day (QID) | INTRAMUSCULAR | Status: DC | PRN

## 2015-03-03 MED ORDER — ACETAMINOPHEN 650 MG RE SUPP: 650.0000 mg | Freq: Four times a day (QID) | RECTAL | Status: DC | PRN

## 2015-03-03 MED ORDER — ONDANSETRON HCL 4 MG PO TABS: 4.0000 mg | ORAL_TABLET | Freq: Four times a day (QID) | ORAL | Status: DC | PRN

## 2015-03-03 MED ORDER — CLONAZEPAM 1 MG PO TABS
1.0000 mg | ORAL_TABLET | Freq: Every day | ORAL | Status: DC
  Administered 2015-03-03 – 2015-03-07 (×5): 1 mg via ORAL
  Filled 2015-03-03 (×5): qty 1

## 2015-03-03 MED ORDER — LEVOTHYROXINE SODIUM 88 MCG PO TABS
88.0000 ug | ORAL_TABLET | Freq: Every day | ORAL | Status: DC
  Administered 2015-03-03 – 2015-03-08 (×6): 88 ug via ORAL
  Filled 2015-03-03 (×8): qty 1

## 2015-03-03 MED ORDER — CLONAZEPAM 0.5 MG PO TABS
0.5000 mg | ORAL_TABLET | Freq: Every day | ORAL | Status: DC
  Administered 2015-03-04 – 2015-03-08 (×5): 0.5 mg via ORAL
  Filled 2015-03-03 (×5): qty 1

## 2015-03-03 MED ORDER — PANTOPRAZOLE SODIUM 40 MG PO TBEC
40.0000 mg | DELAYED_RELEASE_TABLET | ORAL | Status: DC
  Administered 2015-03-03 (×2): 40 mg via ORAL
  Filled 2015-03-03: qty 1

## 2015-03-03 MED ORDER — BISACODYL 10 MG RE SUPP
10.0000 mg | Freq: Every day | RECTAL | Status: DC | PRN
  Administered 2015-03-04 – 2015-03-06 (×2): 10 mg via RECTAL
  Filled 2015-03-03 (×2): qty 1

## 2015-03-03 MED ORDER — POTASSIUM CHLORIDE 10 MEQ/100ML IV SOLN
10.0000 meq | INTRAVENOUS | Status: AC
Start: 2015-03-03 — End: 2015-03-04
  Administered 2015-03-03 (×4): 10 meq via INTRAVENOUS
  Filled 2015-03-03 (×4): qty 100

## 2015-03-03 MED ORDER — HYDROCODONE-ACETAMINOPHEN 5-325 MG PO TABS
0.5000 | ORAL_TABLET | Freq: Every day | ORAL | Status: DC
  Administered 2015-03-04 – 2015-03-07 (×4): 0.5 via ORAL
  Filled 2015-03-03 (×4): qty 1

## 2015-03-03 MED ORDER — DEXTROSE 5 % IV SOLN
1.0000 g | INTRAVENOUS | Status: DC
  Administered 2015-03-03 – 2015-03-06 (×4): 1 g via INTRAVENOUS
  Filled 2015-03-03 (×4): qty 10

## 2015-03-03 MED ORDER — MIRTAZAPINE 15 MG PO TABS
15.0000 mg | ORAL_TABLET | Freq: Every day | ORAL | Status: DC
  Administered 2015-03-03 – 2015-03-07 (×5): 15 mg via ORAL
  Filled 2015-03-03 (×5): qty 1

## 2015-03-03 MED ORDER — HYDROCODONE-ACETAMINOPHEN 5-325 MG PO TABS
0.5000 | ORAL_TABLET | Freq: Every evening | ORAL | Status: DC | PRN
  Administered 2015-03-05 – 2015-03-06 (×2): 0.5 via ORAL
  Filled 2015-03-03 (×3): qty 1

## 2015-03-03 MED ORDER — ATORVASTATIN CALCIUM 20 MG PO TABS
20.0000 mg | ORAL_TABLET | Freq: Every day | ORAL | Status: DC
  Administered 2015-03-04 – 2015-03-07 (×4): 20 mg via ORAL
  Filled 2015-03-03 (×7): qty 1

## 2015-03-03 MED ORDER — MAGNESIUM SULFATE 2 GM/50ML IV SOLN
2.0000 g | Freq: Once | INTRAVENOUS | Status: AC
  Administered 2015-03-03: 2 g via INTRAVENOUS
  Filled 2015-03-03: qty 50

## 2015-03-03 MED ORDER — DOCUSATE SODIUM 100 MG PO CAPS
100.0000 mg | ORAL_CAPSULE | Freq: Two times a day (BID) | ORAL | Status: DC
  Administered 2015-03-03 – 2015-03-08 (×11): 100 mg via ORAL
  Filled 2015-03-03 (×12): qty 1

## 2015-03-03 MED ORDER — SENNA 8.6 MG PO TABS
1.0000 | ORAL_TABLET | Freq: Two times a day (BID) | ORAL | Status: DC
  Administered 2015-03-03 – 2015-03-08 (×10): 8.6 mg via ORAL
  Filled 2015-03-03 (×10): qty 1

## 2015-03-03 MED ORDER — HYDROCODONE-ACETAMINOPHEN 5-325 MG PO TABS
1.0000 | ORAL_TABLET | ORAL | Status: DC
Start: 2015-03-03 — End: 2015-03-03
  Administered 2015-03-03: 1 via ORAL
  Filled 2015-03-03: qty 1

## 2015-03-03 MED ORDER — PANTOPRAZOLE SODIUM 40 MG PO TBEC
40.0000 mg | DELAYED_RELEASE_TABLET | Freq: Every day | ORAL | Status: DC
Start: 2015-03-03 — End: 2015-03-08
  Administered 2015-03-04 – 2015-03-08 (×5): 40 mg via ORAL
  Filled 2015-03-03 (×7): qty 1

## 2015-03-03 NOTE — ED Notes (Signed)
Patient going in and out of a-fib with PVC's.  [facility] paged.

## 2015-03-03 NOTE — ED Notes (Signed)
Maralyn SagoSarah Rumley-Daughter- [phone]

## 2015-03-03 NOTE — Care Management Note (Addendum)
    Page 1 of 1   03/08/2015     2:32:21 PM CARE MANAGEMENT NOTE 03/08/2015  Patient:  Jillian Wheeler,Jillian Wheeler   Account Number:  [id number]  Date Initiated:  03/03/2015  Documentation initiated by:  Lanier ClamMAHABIR,Onesti Bonfiglio  Subjective/Objective Assessment:   81 y/o f admitted w/hyponatremia,uti.     Action/Plan:   From home.   Anticipated DC Date:  03/08/2015   Anticipated DC Plan:  SKILLED NURSING FACILITY      DC Planning Services  CM consult      Choice offered to / List presented to:             Status of service:  Completed, signed off Medicare Important Message given?  YES (If response is "NO", the following Medicare IM given date fields will be blank) Date Medicare IM given:  03/08/2015 Medicare IM given by:  [facility] [place]Arbor Cohen Date Additional Medicare IM given:   Additional Medicare IM given by:    Discharge Disposition:  SKILLED NURSING FACILITY  Per UR Regulation:  Reviewed for med. necessity/level of care/duration of stay  If discussed at Long Length of Stay Meetings, dates discussed:    Comments:  03/08/15 Lanier ClamKathy Meshulem Onorato RN BSN NCM [phone] Spoke to dtr/son in rm about d/Wheeler process.They were concerned that patient was d/Wheeler too soon without enough notice.Informed them of Dr/Entire team of d/Wheeler planning process since admission.Informed of medicare IM notice(they did not want to appeal),but just vented that they didn't have time over weekend with all of there other home responsibilities(finances,work,appts) to prepare for d/Wheeler to SNF on Monday.They voiced understanding of d/Wheeler planning process & now were ready for [facility]eler [facility]   03/04/15 Lanier ClamKathy Bradyn Soward RN BSN NCM [phone] 4p-PT-SNF.CSW already following. PT ordered.Await recommendations.[facility] already following.  03/03/15 Lanier ClamKathy Brighten Orndoff RN BSN NCM [phone] Recommend PT cons.

## 2015-03-03 NOTE — Progress Notes (Addendum)
Patient admitted after midnight. Please see H&P. A/p: Hyponatremia - patient has had decreased by mouth intake.  -gentle IV fluids  -BMP q 4 hours -TSH ok   DIASTOLIC HEART FAILURE, CHRONIC -  -echo  Atrial fib -? New -echo  Hypokalemia - will replace   Anemia - obtain anemia panel. Patient's Hemoccult negative  Urinate tract infection patient endorses dysuria -Rocephin. Await results of urine culture  Back pain. Continue home medications. Patient has history of extensive degenerative disc disease likely contributing to her chronic pain. Imaging at the time of mission showed no evidence of acute fractures  Will need PT/OTconsult once NA better  Marlin CanaryJessica Lien Lyman DO

## 2015-03-03 NOTE — Progress Notes (Signed)
CRITICAL VALUE ALERT  Critical value received:  Na 119  Date of notification:  03/03/2015  Time of notification:  0824  Critical value read back:Yes.    Nurse who received alert:  Mardene CelesteAsaro, Daveion Robar I  MD notified (1st page):  n/a  Time of first page:  n/a   MD aware of critical result already.
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
## 2015-03-03 NOTE — ED Notes (Signed)
Patient requesting "stronger pain medication" admitting hospitalist notified, no new orders

## 2015-03-03 NOTE — ED Notes (Signed)
Patient's brief changed, perineal care given.

## 2015-03-04 DIAGNOSIS — I779 Disorder of arteries and arterioles, unspecified: Secondary | ICD-10-CM

## 2015-03-04 DIAGNOSIS — N390 Urinary tract infection, site not specified: Secondary | ICD-10-CM

## 2015-03-04 LAB — BASIC METABOLIC PANEL
Anion gap: 4 — ABNORMAL LOW (ref 5–15)
Anion gap: 6 (ref 5–15)
BUN: 12 mg/dL (ref 6–23)
BUN: 11 mg/dL (ref 6–23)
CALCIUM: 8.8 mg/dL (ref 8.4–10.5)
CHLORIDE: 96 mmol/L (ref 96–112)
CHLORIDE: 93 mmol/L — AB (ref 96–112)
CO2: 18 mmol/L — ABNORMAL LOW (ref 19–32)
CO2: 23 mmol/L (ref 19–32)
CREATININE: 0.86 mg/dL (ref 0.50–1.10)
Calcium: 8.1 mg/dL — ABNORMAL LOW (ref 8.4–10.5)
Creatinine, Ser: 0.87 mg/dL (ref 0.50–1.10)
GFR calc Af Amer: 67 mL/min — ABNORMAL LOW (ref >90)
GFR, EST AFRICAN AMERICAN: 68 mL/min — AB (ref >90)
GFR, EST NON AFRICAN AMERICAN: 59 mL/min — AB (ref >90)
GFR, EST NON AFRICAN AMERICAN: 58 mL/min — AB (ref >90)
GLUCOSE: 95 mg/dL (ref 70–99)
Glucose, Bld: 109 mg/dL — ABNORMAL HIGH (ref 70–99)
POTASSIUM: 3.8 mmol/L (ref 3.5–5.1)
Potassium: 3.7 mmol/L (ref 3.5–5.1)
SODIUM: 118 mmol/L — AB (ref 135–145)
Sodium: 122 mmol/L — ABNORMAL LOW (ref 135–145)

## 2015-03-04 LAB — GLUCOSE, CAPILLARY
Glucose-Capillary: 106 mg/dL — ABNORMAL HIGH (ref 70–99)
Glucose-Capillary: 101 mg/dL — ABNORMAL HIGH (ref 70–99)
Glucose-Capillary: 111 mg/dL — ABNORMAL HIGH (ref 70–99)
Glucose-Capillary: 110 mg/dL — ABNORMAL HIGH (ref 70–99)

## 2015-03-04 LAB — CBC
HCT: 26.8 % — ABNORMAL LOW (ref 36.0–46.0)
Hemoglobin: 9.8 g/dL — ABNORMAL LOW (ref 12.0–15.0)
MCH: 36 pg — AB (ref 26.0–34.0)
MCHC: 36.6 g/dL — AB (ref 30.0–36.0)
MCV: 98.5 fL (ref 78.0–100.0)
PLATELETS: 248 10*3/uL (ref 150–400)
RBC: 2.72 MIL/uL — ABNORMAL LOW (ref 3.87–5.11)
RDW: 12.9 % (ref 11.5–15.5)
WBC: 11.2 10*3/uL — ABNORMAL HIGH (ref 4.0–10.5)

## 2015-03-04 NOTE — Progress Notes (Signed)
[facility] been [facility] community nurse and social worker.  Spoke with inpatient RNCM, Lanier ClamKathy Mahabir in regards [facility] and that the patient had expressed an interest in going to a skilled rehab to the [facility].  [facility] does not replace or interfere with any services arranged for this patient by the inpatient care management team.  For questions, please call: Charlesetta ShanksVictoria Miachel Nardelli, RN BSN CCM [facility]  [phone] business mobile phone

## 2015-03-04 NOTE — Progress Notes (Signed)
Clinical Social Work Department BRIEF PSYCHOSOCIAL ASSESSMENT 03/04/2015  Patient:  Jillian Wheeler, Jillian Wheeler     Account Number:  [id number]     Admit date:  03/02/2015  Clinical Social Worker:  Glorious Peach, CLINICAL SOCIAL WORKER  Date/Time:  03/04/2015 03:06 PM  Referred by:  Physician  Date Referred:  03/04/2015 Referred for  SNF Placement   Other Referral:   Interview type:  Family Other interview type:   son and daughter Jillian Wheeler) at bedside.    PSYCHOSOCIAL DATA Living Status:  FAMILY Admitted from facility:   Level of care:   Primary support name:  Jillian Wheeler Primary support relationship to patient:  CHILD, ADULT Degree of support available:   Adequate    CURRENT CONCERNS Current Concerns  Post-Acute Placement   Other Concerns:    SOCIAL WORK ASSESSMENT / PLAN CSW received consult from MD. [place] completed FL2, and faxed out information to [facility], awaiting bed offers.   Assessment/plan status:  Information/Referral to [employer] Other assessment/ plan:   Information/referral to community resources:   Family requests placement at [facility]. CSW to contact to see if SNF can make bed offer. Family also considering [facility].    PATIENT'S/FAMILY'S RESPONSE TO PLAN OF CARE: CSW met with patient and family at bedside. Daughter, Jillian Wheeler requests that patient goes to a SNF for short term rehab and hopes that [facility] can make bed offer. CSW contacted [facility] who is not in network with patient insurance. CSW informed family who is willing for patient to go to [facility] if they are able to make bed offer. CSW contacted Ritta Slot, left a message, waiting on call back. CSW will continue to follow.       Glorious Peach BSW Intern

## 2015-03-04 NOTE — Progress Notes (Signed)
  Echocardiogram 2D Echocardiogram has been performed.  [name], [name] 03/04/2015, 2:19 PM

## 2015-03-04 NOTE — Evaluation (Signed)
Physical Therapy Evaluation Patient Details Name: Jillian Wheeler MRN: [id number] DOB: 1925/01/10 Today's Date: 03/04/2015   History of Present Illness  81 yo female admitted with asthma, hyponatremia. Hx of DM, back pain.   Clinical Impression  On eval, pt required Min assist for mobility-able to ambulate ~60 feet with RW. Pt fatigues fairly quickly but participates well. Recommend ST rehab at SNF to maximize independence and safety with mobility so pt can return home.     Follow Up Recommendations SNF;Supervision/Assistance - 24 hour    Equipment Recommendations  None recommended by PT    Recommendations for Other Services OT consult     Precautions / Restrictions Precautions Precautions: Fall Restrictions Weight Bearing Restrictions: No      Mobility  Bed Mobility Overal bed mobility: Needs Assistance Bed Mobility: Supine to Sit;Sit to Supine     Supine to sit: Min assist Sit to supine: Min guard   General bed mobility comments: small amount of assist for trunk.  Increased time.   Transfers Overall transfer level: Needs assistance Equipment used: Rolling walker (2 wheeled) Transfers: Sit to/from Stand Sit to Stand: Min assist         General transfer comment: Assist to rise, stabilize, control descent. VCs safety, hand placement  Ambulation/Gait Ambulation/Gait assistance: Min assist Ambulation Distance (Feet): 60 Feet Assistive device: Rolling walker (2 wheeled) Gait Pattern/deviations: Step-through pattern;Decreased stride length;Trunk flexed     General Gait Details: slow gait speed. Fatigues fairly quickly. Assist to stabilize.   Stairs            Wheelchair Mobility    Modified Rankin (Stroke Patients Only)       Balance Overall balance assessment: Needs assistance         Standing balance support: Bilateral upper extremity supported;During functional activity Standing balance-Leahy Scale: Poor                                Pertinent Vitals/Pain Pain Assessment: Faces Faces Pain Scale: Hurts little more Pain Location: R upper back Pain Descriptors / Indicators: Sore Pain Intervention(s): Monitored during session;Repositioned    Home Living Family/patient expects to be discharged to:: Private residence Living Arrangements: Alone   Type of Home: House Home Access: Stairs to enter Entrance Stairs-Rails: Doctor, general practice of Steps: 3 Home Layout: One level Home Equipment: Cane - single point (3 wheeled walker)      Prior Function Level of Independence: Independent with assistive device(s)         Comments: using cane PTA     Hand Dominance        Extremity/Trunk Assessment   Upper Extremity Assessment: Generalized weakness           Lower Extremity Assessment: Generalized weakness      Cervical / Trunk Assessment: Kyphotic  Communication   Communication: HOH  Cognition Arousal/Alertness: Awake/alert Behavior During Therapy: WFL for tasks assessed/performed Overall Cognitive Status: Within Functional Limits for tasks assessed                      General Comments      Exercises        Assessment/Plan    PT Assessment Patient needs continued PT services  PT Diagnosis Difficulty walking;Generalized weakness   PT Problem List Decreased strength;Decreased activity tolerance;Decreased balance;Decreased mobility;Pain;Decreased knowledge of use of DME  PT Treatment Interventions DME instruction;Gait training;Functional mobility training;Therapeutic activities;Therapeutic exercise;Patient/family education;Balance training  PT Goals (Current goals can be found in the Care Plan section) Acute Rehab PT Goals Patient Stated Goal: to get better PT Goal Formulation: With patient Time For Goal Achievement: 03/18/15 Potential to Achieve Goals: Good    Frequency Min 3X/week   Barriers to discharge        Co-evaluation               End of  Session Equipment Utilized During Treatment: Gait belt Activity Tolerance: Patient limited by fatigue Patient left: in bed;with call bell/phone within reach           Time: 1511-1534 PT Time Calculation (min) (ACUTE ONLY): 23 min   Charges:   PT Evaluation $Initial PT Evaluation Tier I: 1 Procedure PT Treatments $Gait Training: 8-22 mins   PT G Codes:        Rebeca AlertJannie Avari Nevares, MPT Pager: [phone]

## 2015-03-04 NOTE — Progress Notes (Signed)
PROGRESS NOTE  Jillian Wheeler ZHY:[id number] DOB: 06-29-1925 DOA: 03/02/2015 PCP: Hoyle SauerAVVA,RAVISANKAR R, MD  Assessment/Plan: Hyponatremia - patient has had decreased by mouth intake, fluid restrict as well -gentle IV fluids  -BMP daily -TSH ok  DIASTOLIC HEART FAILURE, CHRONIC -  -echo  ER nurse reported a fib- tele appears to be sinus with PVCs  Hypokalemia - replace   Anemia -  Hemoccult negative  Urinate tract infection patient endorses dysuria -Rocephin. Await results of urine culture  Back pain. Continue home medications. Patient has history of extensive degenerative disc disease likely contributing to her chronic pain. Imaging at the time of mission showed no evidence of acute fractures   Code Status: DNR Family Communication: son on phone- family interested in SNF/rehab Disposition Plan: PT/OT consult   Consultants:    Procedures:    Antibiotics:   HPI/Subjective: Feeling better today  Objective: Filed Vitals:   03/04/15 0459  BP: 144/77  Pulse: 69  Temp: 97.7 F (36.5 C)  Resp: 20    Intake/Output Summary (Last 24 hours) at 03/04/15 1131 Last data filed at 03/04/15 0600  Gross per 24 hour  Intake    170 ml  Output    750 ml  Net   -580 ml   [facility]   03/02/15 2123 03/03/15 1536  Weight: 67.132 kg (148 lb) 69.5 kg (153 lb 3.5 oz)    Exam:   General:  Pleasant/cooperative  Cardiovascular: rrr  Respiratory: clear  Abdomen: +BS, soft  Musculoskeletal:no edema   Data Reviewed: Basic Metabolic Panel:  Recent Labs Lab 03/03/15 0413 03/03/15 1135 03/03/15 1640 03/03/15 1915 03/03/15 2300 03/04/15 0428  NA 119* 117* 120* 119* 118* 122*  K 2.9* 3.3* 4.2 3.7 3.7 3.8  CL 88* 90* 93* 93* 96 93*  CO2 22 20 22 20  18* 23  GLUCOSE 128* 169* 111* 121* 95 109*  BUN 14 11 12 13 12 11   CREATININE 0.88 0.92 0.91 0.86 0.86 0.87  CALCIUM 8.5 8.4 8.0* 8.0* 8.1* 8.8  MG 1.5  --   --   --   --   --   PHOS 2.1*  --   --   --   --   --     Liver Function Tests:  Recent Labs Lab 03/03/15 0135 03/03/15 0413  AST 26 28  ALT 21 23  ALKPHOS 67 67  BILITOT 0.7 0.4  PROT 5.8* 5.8*  ALBUMIN 3.2* 3.1*   No results for input(s): LIPASE, AMYLASE in the last 168 hours. No results for input(s): AMMONIA in the last 168 hours. CBC:  Recent Labs Lab 03/02/15 2227 03/03/15 0404 03/03/15 0413 03/04/15 0428  WBC 10.9* 11.3* 11.1* 11.2*  NEUTROABS 8.0*  --   --   --   HGB 10.9* 9.8* 9.5* 9.8*  HCT 30.0* 26.8* 25.8* 26.8*  MCV 96.5 97.1 97.0 98.5  PLT 290 231 270 248   Cardiac Enzymes: No results for input(s): CKTOTAL, CKMB, CKMBINDEX, TROPONINI in the last 168 hours. BNP (last 3 results) No results for input(s): BNP in the last 8760 hours.  ProBNP (last 3 results) No results for input(s): PROBNP in the last 8760 hours.  CBG:  Recent Labs Lab 03/03/15 0806 03/03/15 1243 03/03/15 1715 03/03/15 2152 03/04/15 0733  GLUCAP 124* 154* 119* 104* 106*    No results found for this or any previous visit (from the past 240 hour(s)).   Studies: Dg Chest 2 View  03/03/2015   CLINICAL DATA:  Acute onset  of shortness of breath. Status post fall. Initial encounter.  EXAM: CHEST  2 VIEW  COMPARISON:  Chest radiograph performed 02/05/2015  FINDINGS: The lungs are well-aerated and clear. There is no evidence of focal opacification, pleural effusion or pneumothorax.  The heart is mildly enlarged. No acute osseous abnormalities are seen.  IMPRESSION: Mild cardiomegaly; lungs remain grossly clear. No displaced rib fracture seen.   Electronically Signed   By: Roanna Raider M.D.   On: 03/03/2015 02:04   Dg Lumbar Spine Complete  03/02/2015   CLINICAL DATA:  Larey Seat at 1900 hours  EXAM: LUMBAR SPINE - COMPLETE 4+ VIEW  COMPARISON:  None.  FINDINGS: There is old L5 compression and vertebroplasty. The other lumbar vertebrae are normal in height. Severe degenerative disc changes are present. Moderate left convex curvature is present. No  bone lesion or bony destruction is evident. No acute fracture is evident.  IMPRESSION: Negative for acute lumbar spine fracture. Curvature and severe degenerative changes are present. Old L5 compression and vertebroplasty.   Electronically Signed   By: Ellery Plunk M.D.   On: 03/02/2015 23:56   Ct Head Wo Contrast  03/03/2015   CLINICAL DATA:  Larey Seat at 1900 hours  EXAM: CT HEAD WITHOUT CONTRAST  TECHNIQUE: Contiguous axial images were obtained from the base of the skull through the vertex without intravenous contrast.  COMPARISON:  02/05/2015  FINDINGS: There is no intracranial hemorrhage, mass or evidence of acute infarction. There is moderate generalized atrophy. There is moderate chronic microvascular ischemic change. There is no significant extra-axial fluid collection.  No acute intracranial findings are evident. The calvarium and skullbase are intact.  IMPRESSION: Negative for acute intracranial traumatic injury. There is moderate chronic atrophy and microvascular disease.   Electronically Signed   By: Ellery Plunk M.D.   On: 03/03/2015 00:29   US Renal  03/03/2015   CLINICAL DATA:  Complex left upper pole renal lesion observed on CT  EXAM: RENAL/URINARY TRACT ULTRASOUND COMPLETE  COMPARISON:  CT 12/23/2014  FINDINGS: Right Kidney:  Length: 10.4 cm. Marked parenchymal thinning consistent with atrophy.  Left Kidney:  Length: 8.0 cm. Marked parenchymal thinning consistent with atrophy. The small focal lesion observed on CT is not visible on this study.  Bladder:  Appears normal for degree of bladder distention. Bilateral ureteral jets were documented.  IMPRESSION: Atrophic kidneys, without hydronephrosis. The small focal lesion observed on CT was not visible on this examination.   Electronically Signed   By: Ellery Plunk M.D.   On: 03/03/2015 04:20   Dg Hip Unilat With Pelvis 2-3 Views Right  03/02/2015   CLINICAL DATA:  Larey Seat at 1900 hours  EXAM: RIGHT HIP (WITH PELVIS) 2-3 VIEWS   COMPARISON:  None.  FINDINGS: Negative for acute fracture or dislocation. There is old calcification about the greater trochanter suggesting trochanteric bursitis. No bone lesion or bony destruction is evident.  IMPRESSION: Negative for acute fracture   Electronically Signed   By: Ellery Plunk M.D.   On: 03/02/2015 23:53    Scheduled Meds: . allopurinol  100 mg Oral Daily  . atorvastatin  20 mg Oral Daily  . cefTRIAXone (ROCEPHIN) IVPB 1 gram/50 mL D5W  1 g Intravenous Q24H  . clonazePAM  0.5 mg Oral Daily  . clonazePAM  1 mg Oral QHS  . docusate sodium  100 mg Oral BID  . enoxaparin (LOVENOX) injection  40 mg Subcutaneous Q24H  . HYDROcodone-acetaminophen  0.5 tablet Oral QHS  . HYDROcodone-acetaminophen  1 tablet  Oral Daily  . insulin aspart  0-5 Units Subcutaneous QHS  . insulin aspart  0-9 Units Subcutaneous TID WC  . levothyroxine  88 mcg Oral QAC breakfast  . mirtazapine  15 mg Oral QHS  . pantoprazole  40 mg Oral Daily  . polyethylene glycol  17 g Oral Daily  . senna  1 tablet Oral BID  . sodium chloride  3 mL Intravenous Q12H   Continuous Infusions:  Antibiotics Given (last 72 hours)    None      Active Problems:   DIASTOLIC HEART FAILURE, CHRONIC   Asthma   Hyponatremia   Hypokalemia   Anemia    Time spent: 25 min    Rhyann Berton  [facility] Pager [phone]. If 7PM-7AM, please contact night-coverage at [email or web address], password [facility] 03/04/2015, 11:31 AM  LOS: 1 day

## 2015-03-05 ENCOUNTER — Other Ambulatory Visit: Payer: Self-pay

## 2015-03-05 LAB — CBC
HCT: 28.9 % — ABNORMAL LOW (ref 36.0–46.0)
HEMOGLOBIN: 10.4 g/dL — AB (ref 12.0–15.0)
MCH: 35.3 pg — ABNORMAL HIGH (ref 26.0–34.0)
MCHC: 36 g/dL (ref 30.0–36.0)
MCV: 98 fL (ref 78.0–100.0)
PLATELETS: 262 10*3/uL (ref 150–400)
RBC: 2.95 MIL/uL — ABNORMAL LOW (ref 3.87–5.11)
RDW: 12.6 % (ref 11.5–15.5)
WBC: 10.3 10*3/uL (ref 4.0–10.5)

## 2015-03-05 LAB — BASIC METABOLIC PANEL
ANION GAP: 9 (ref 5–15)
BUN: 11 mg/dL (ref 6–23)
CALCIUM: 8.9 mg/dL (ref 8.4–10.5)
CO2: 24 mmol/L (ref 19–32)
CREATININE: 0.86 mg/dL (ref 0.50–1.10)
Chloride: 87 mmol/L — ABNORMAL LOW (ref 96–112)
GFR calc Af Amer: 68 mL/min — ABNORMAL LOW (ref >90)
GFR calc non Af Amer: 59 mL/min — ABNORMAL LOW (ref >90)
GLUCOSE: 136 mg/dL — AB (ref 70–99)
Potassium: 3.3 mmol/L — ABNORMAL LOW (ref 3.5–5.1)
Sodium: 120 mmol/L — ABNORMAL LOW (ref 135–145)

## 2015-03-05 LAB — URINE CULTURE: Colony Count: >=100000

## 2015-03-05 LAB — GLUCOSE, CAPILLARY
GLUCOSE-CAPILLARY: 144 mg/dL — AB (ref 70–99)
GLUCOSE-CAPILLARY: 103 mg/dL — AB (ref 70–99)
Glucose-Capillary: 123 mg/dL — ABNORMAL HIGH (ref 70–99)
Glucose-Capillary: 102 mg/dL — ABNORMAL HIGH (ref 70–99)

## 2015-03-05 MED ORDER — VITAMINS A & D EX OINT
TOPICAL_OINTMENT | CUTANEOUS | Status: AC
  Administered 2015-03-05: 1
  Filled 2015-03-05: qty 5

## 2015-03-05 MED ORDER — SODIUM CHLORIDE 0.9 % IV SOLN
INTRAVENOUS | Status: DC
  Administered 2015-03-05 – 2015-03-07 (×4): via INTRAVENOUS

## 2015-03-05 MED ORDER — HYDRALAZINE HCL 20 MG/ML IJ SOLN
5.0000 mg | Freq: Once | INTRAMUSCULAR | Status: AC
  Administered 2015-03-05: 5 mg via INTRAVENOUS
  Filled 2015-03-05: qty 1

## 2015-03-05 MED ORDER — CIPROFLOXACIN HCL 500 MG PO TABS: 500.0000 mg | ORAL_TABLET | Freq: Two times a day (BID) | ORAL | Status: DC

## 2015-03-05 MED ORDER — HYDROCODONE-ACETAMINOPHEN 5-325 MG PO TABS
1.0000 | ORAL_TABLET | Freq: Once | ORAL | Status: AC
  Administered 2015-03-05: 1 via ORAL
  Filled 2015-03-05: qty 1

## 2015-03-05 NOTE — Progress Notes (Signed)
Physical Therapy Treatment Patient Details Name: Nilda Riggstta C Muzyka MRN: [id number] DOB: 09-19-1925 Today's Date: 03/05/2015    History of Present Illness 81 yo female admitted with asthma, hyponatremia. Hx of DM, back pain.     PT Comments    While attempting to ambulate with pt, pt experienced near syncopal episode-BP 101/36, HR 80 bpm, O2 98% on RA immediately after pt was seated safely in recliner. Made NT aware of events. Continue to recommend SNF.   Follow Up Recommendations  SNF;Supervision/Assistance - 24 hour     Equipment Recommendations  None recommended by PT    Recommendations for Other Services OT consult     Precautions / Restrictions Precautions Precautions: Fall Restrictions Weight Bearing Restrictions: No    Mobility  Bed Mobility Overal bed mobility: Needs Assistance Bed Mobility: Supine to Sit     Supine to sit: Min guard;HOB elevated     General bed mobility comments: Increased time.   Transfers Overall transfer level: Needs assistance Equipment used: Rolling walker (2 wheeled) Transfers: Sit to/from Stand Sit to Stand: Min assist         General transfer comment: Assist to rise, stabilize, control descent. VCs safety, technique, hand placement.   Ambulation/Gait Ambulation/Gait assistance: Min assist Ambulation Distance (Feet): 5 Feet Assistive device: Rolling walker (2 wheeled) Gait Pattern/deviations: Decreased step length - right;Decreased step length - left;Step-through pattern;Trunk flexed     General Gait Details: Near syncopal episode while ambulating-NT had to bring recliner so pt could sit.    Stairs            Wheelchair Mobility    Modified Rankin (Stroke Patients Only)       Balance           Standing balance support: Bilateral upper extremity supported;During functional activity Standing balance-Leahy Scale: Poor                      Cognition Arousal/Alertness: Awake/alert Behavior During  Therapy: WFL for tasks assessed/performed Overall Cognitive Status: Within Functional Limits for tasks assessed                      Exercises      General Comments        Pertinent Vitals/Pain Pain Assessment: No/denies pain    Home Living                      Prior Function            PT Goals (current goals can now be found in the care plan section) Progress towards PT goals: Not progressing toward goals - comment (this session-near syncopal episode)    Frequency  Min 3X/week    PT Plan Current plan remains appropriate    Co-evaluation             End of Session Equipment Utilized During Treatment: Gait belt Activity Tolerance: Patient limited by fatigue;Other (comment) (near syncopal episode) Patient left: in chair;with chair alarm set;with call bell/phone within reach     Time: [id number] PT Time Calculation (min) (ACUTE ONLY): 20 min  Charges:  $Gait Training: 8-22 mins                    G Codes:      Rebeca AlertJannie Treylan Mcclintock, MPT Pager: [phone]

## 2015-03-05 NOTE — Progress Notes (Addendum)
PROGRESS NOTE  Jillian Wheeler WJX:[id number] DOB: 03-May-1925 DOA: 03/02/2015 PCP: Hoyle Sauer, MD  Assessment/Plan: Hyponatremia  -likely multifactorial-dehydration and component of SIADH -check Urine sodium and urine osm -was fluid restriction per Dr.Vann, supposed to be on IVF too but based on intake charted doesn't appear so -i will start NS at 75cc/hr, await urine Na and osms -Na stable but without significant improvement -TSh normal -requested records from PCP's office to determine baseline sodium-addendum: Na was 134 on 3/4  DIASTOLIC HEART FAILURE, CHRONIC -  -stable, compensated   Constipation -add miralax and senokot  Hypokalemia - replace   Anemia -  Hemoccult negative  Ecoli UTI -continue Rocephin, await senistivities  Back pain. Continue home medications. Patient has history of extensive degenerative disc disease likely contributing to her chronic pain. Imaging at the time of mission showed no evidence of acute fractures  DVt proph: lovenox  Code Status: DNR Family Communication: no family at bedside, will call son later today Disposition Plan: PT/OT consult   Consultants:    Procedures:    Antibiotics:   HPI/Subjective: Feeling better today  Objective: Filed Vitals:   03/05/15 0646  BP: 169/65  Pulse:   Temp:   Resp:     Intake/Output Summary (Last 24 hours) at 03/05/15 0732 Last data filed at 03/05/15 0629  Gross per 24 hour  Intake    230 ml  Output   1451 ml  Net  -1221 ml   Filed Weights   03/03/15 1536 03/04/15 1700 03/05/15 0508  Weight: 69.5 kg (153 lb 3.5 oz) 75.4 kg (166 lb 3.6 oz) 75.4 kg (166 lb 3.6 oz)    Exam:   General:  Pleasant/cooperative, AAOx3, no distress  Cardiovascular: RRR  Respiratory: clear  Abdomen: +BS, soft  Musculoskeletal:no edema   Data Reviewed: Basic Metabolic Panel:  Recent Labs Lab 03/03/15 0413  03/03/15 1640 03/03/15 1915 03/03/15 2300 03/04/15 0428 03/05/15 0500  NA  119*  < > 120* 119* 118* 122* 120*  K 2.9*  < > 4.2 3.7 3.7 3.8 3.3*  CL 88*  < > 93* 93* 96 93* 87*  CO2 22  < > 22 20 18* 23 24  GLUCOSE 128*  < > 111* 121* 95 109* 136*  BUN 14  < > CREATININE 0.88  < > 0.91 0.86 0.86 0.87 0.86  CALCIUM 8.5  < > 8.0* 8.0* 8.1* 8.8 8.9  MG 1.5  --   --   --   --   --   --   PHOS 2.1*  --   --   --   --   --   --   < > = values in this interval not displayed. Liver Function Tests:  Recent Labs Lab 03/03/15 0135 03/03/15 0413  AST 26 28  ALT 21 23  ALKPHOS 67 67  BILITOT 0.7 0.4  PROT 5.8* 5.8*  ALBUMIN 3.2* 3.1*   No results for input(s): LIPASE, AMYLASE in the last 168 hours. No results for input(s): AMMONIA in the last 168 hours. CBC:  Recent Labs Lab 03/02/15 2227 03/03/15 0404 03/03/15 0413 03/04/15 0428 03/05/15 0500  WBC 10.9* 11.3* 11.1* 11.2* 10.3  NEUTROABS 8.0*  --   --   --   --   HGB 10.9* 9.8* 9.5* 9.8* 10.4*  HCT 30.0* 26.8* 25.8* 26.8* 28.9*  MCV 96.5 97.1 97.0 98.5 98.0  PLT 290 231 270 248 262   Cardiac Enzymes: No results  for input(s): CKTOTAL, CKMB, CKMBINDEX, TROPONINI in the last 168 hours. BNP (last 3 results) No results for input(s): BNP in the last 8760 hours.  ProBNP (last 3 results) No results for input(s): PROBNP in the last 8760 hours.  CBG:  Recent Labs Lab 03/03/15 2152 03/04/15 0733 03/04/15 1137 03/04/15 1655 03/04/15 2145  GLUCAP 104* 106* 101* 111* 110*    Recent Results (from the past 240 hour(s))  Culture, Urine     Status: None (Preliminary result)   Collection Time: 03/02/15 11:36 PM  Result Value Ref Range Status   Specimen Description URINE, CLEAN CATCH  Final   Special Requests NONE  Final   Colony Count   Final    >=100,000 COLONIES/ML Performed at [facility]    Culture   Final    ESCHERICHIA COLI Performed at [facility]    Report Status PENDING  Incomplete     Studies: No results found.  Scheduled Meds: . allopurinol  100  mg Oral Daily  . atorvastatin  20 mg Oral Daily  . cefTRIAXone (ROCEPHIN) IVPB 1 gram/50 mL D5W  1 g Intravenous Q24H  . clonazePAM  0.5 mg Oral Daily  . clonazePAM  1 mg Oral QHS  . docusate sodium  100 mg Oral BID  . enoxaparin (LOVENOX) injection  40 mg Subcutaneous Q24H  . HYDROcodone-acetaminophen  0.5 tablet Oral QHS  . HYDROcodone-acetaminophen  1 tablet Oral Daily  . insulin aspart  0-5 Units Subcutaneous QHS  . insulin aspart  0-9 Units Subcutaneous TID WC  . levothyroxine  88 mcg Oral QAC breakfast  . mirtazapine  15 mg Oral QHS  . pantoprazole  40 mg Oral Daily  . polyethylene glycol  17 g Oral Daily  . senna  1 tablet Oral BID  . sodium chloride  3 mL Intravenous Q12H   Continuous Infusions:  Antibiotics Given (last 72 hours)    None      Active Problems:   DIASTOLIC HEART FAILURE, CHRONIC   Asthma   Hyponatremia   Hypokalemia   Anemia   UTI (urinary tract infection)    Time spent: 25 min    [facility] MinneJOSEPH,Derrien Anschutz  [facility] Pager [phone]. If 7PM-7AM, please contact night-coverage at [email or web address], password [facility] 03/05/2015, 7:32 AM  LOS: 2 days

## 2015-03-05 NOTE — Progress Notes (Signed)
Patient went into second degree AV type 1 block. Patient did not have any pain or discomfort at the time. Patient alert and oriented. EKG performed and showed NSR. MD notified and new orders placed. Will continue to monitor patient.Setzer, Don BroachAllison Marie

## 2015-03-05 NOTE — Evaluation (Signed)
Occupational Therapy Evaluation Patient Details Name: Jillian Wheeler MRN: [id number] DOB: 29-Nov-1925 Today's Date: 03/05/2015    History of Present Illness 81 yo female admitted with asthma, hyponatremia. Hx of DM, back pain.    Clinical Impression   Pt with poor activity tolerance today. She states she feels lightheaded at EOB and pain 7/10 R side/lower back with sitting up to side of bed. BP sitting 132/54. Pt only tolerated sitting to have gown changed and partial standing to have bed pad replaced. Pt states she urinated a little in the bed earlier. Will follow. Recommend SNF at d/c.     Follow Up Recommendations  SNF;Supervision/Assistance - 24 hour    Equipment Recommendations   (TBA next venue)    Recommendations for Other Services       Precautions / Restrictions Precautions Precautions: Fall Restrictions Weight Bearing Restrictions: No      Mobility Bed Mobility Overal bed mobility: Needs Assistance Bed Mobility: Supine to Sit;Sit to Supine     Supine to sit: Min assist Sit to supine: Min assist   General bed mobility comments: assist for Les over to EOB and back onto bed.   Transfers Overall transfer level: Needs assistance   Transfers: Sit to/from Stand           General transfer comment: only partially stood enough to clear bottom off the bed to have gown changed and bed pad replaced. Pt feeling dizzy/lightheaded.    Balance                                            ADL Overall ADL's : Needs assistance/impaired Eating/Feeding: Set up;Bed level   Grooming: Wash/dry hands;Set up;Supervision/safety;Sitting   Upper Body Bathing: Maximal assistance;Sitting Upper Body Bathing Details (indicate cue type and reason): pt dizzy at EOB. not able to tolerate activity well.  Lower Body Bathing: Total assistance;Sit to/from stand Lower Body Bathing Details (indicate cue type and reason): only partially stood. Upper Body Dressing :  Maximal assistance;Sitting Upper Body Dressing Details (indicate cue type and reason): to change gown. Lower Body Dressing: Total assistance;Sit to/from stand Lower Body Dressing Details (indicate cue type and reason): only partially stood. Toilet Transfer: Minimal Dentistassistance Toilet Transfer Details (indicate cue type and reason): only to partially stand to have bed pad changed. Toileting- Clothing Manipulation and Hygiene: Total assistance;Sit to/from stand Toileting - Clothing Manipulation Details (indicate cue type and reason): only partially stood.       General ADL Comments: Pt stating she didnt feel well this am and was hurting in her L lower back/side area. She was agreeable to sit up at EOB and then stated that her gown was wet and noted bedpad was also. Had patient attempt to stand but she was feeling so weak and dizzy that she only partially raised up enough to clear hips from bed to have gown gown pulled out and pad changed. Returned to supine. Pt's pain level at start of session 4/10 and moved up to 7/10 with activity.      Vision     Perception     Praxis      Pertinent Vitals/Pain Pain Assessment: 0-10 Pain Score: 7  Pain Location: R side of back lower area. Pain Descriptors / Indicators: Aching Pain Intervention(s): Repositioned;Monitored during session;Other (comment) (informed nursing)     Hand Dominance     Extremity/Trunk Assessment Upper  Extremity Assessment Upper Extremity Assessment: Generalized weakness           Communication Communication Communication: HOH   Cognition Arousal/Alertness: Awake/alert Behavior During Therapy: WFL for tasks assessed/performed Overall Cognitive Status: Within Functional Limits for tasks assessed                     General Comments       Exercises       Shoulder Instructions      Home Living Family/patient expects to be discharged to:: Skilled nursing facility Living Arrangements: Alone   Type of  Home: House Home Access: Stairs to enter Entrance Stairs-Number of Steps: 3 Entrance Stairs-Rails: Right;Left Home Layout: One level               Home Equipment: Cane - single point (3 wheeled walker)          Prior Functioning/Environment Level of Independence: Independent with assistive device(s)        Comments: using cane PTA    OT Diagnosis: Generalized weakness;Acute pain   OT Problem List: Decreased strength;Decreased knowledge of use of DME or AE;Pain;Decreased activity tolerance   OT Treatment/Interventions: Self-care/ADL training;Patient/family education;Therapeutic activities;DME and/or AE instruction    OT Goals(Current goals can be found in the care plan section) Acute Rehab OT Goals Patient Stated Goal: wants to do more but feeling bad today. OT Goal Formulation: With patient Time For Goal Achievement: 03/19/15 Potential to Achieve Goals: Good  OT Frequency: Min 2X/week   Barriers to D/C:            Co-evaluation              End of Session    Activity Tolerance: Patient limited by fatigue;Patient limited by pain Patient left: in bed;with call bell/phone within reach;with bed alarm set   Time: 1610-9604 OT Time Calculation (min): 16 min Charges:  OT General Charges $OT Visit: 1 Procedure OT Evaluation $Initial OT Evaluation Tier I: 1 Procedure G-Codes:    Lennox Laity  [phone] 03/05/2015, 9:32 AM

## 2015-03-05 NOTE — Progress Notes (Signed)
CSW provided SNF bed offers to patient & daughter, Dorann LodgeJuanita (ph#: [id number]). [facility], [facility] but [facility] as [facility] has [facility] in the past. [facility] who said to call when she's ready for discharge to see if they have a bed available. Anticipating possible discharge Sunday if he sodium trends back up.   If ready over the weekend, please contact weekend CSW, Rene KocherRegina (ph#: [phone]).   Clinical Social Work Department CLINICAL SOCIAL WORK PLACEMENT NOTE 03/05/2015  Patient:  Nilda RiggsBRADY,Defne C  Account Number:  [id number] Admit date:  03/02/2015  Clinical Social Worker:  Orpah GreekKELLY FOLEY, LCSWA  Date/time:  03/04/2015 02:20 PM  Clinical Social Work is seeking post-discharge placement for this patient at the following level of care:   SKILLED NURSING   (*CSW will update this form in Epic as items are completed)   03/04/2015  [facility]/family provided with [facility] of Clinical Social Work's list of facilities offering this level of care within the geographic area requested by the patient (or if unable, by the patient's family).  03/04/2015  Patient/family informed of their freedom to choose among providers that offer the needed level of care, that participate in Medicare, Medicaid or managed care program needed by the patient, have an available bed and are willing to accept the patient.  03/04/2015  Patient/family informed of [facility]' ownership interest in [facility], as well as of the fact that they are under no obligation to receive care at this facility.  PASARR submitted to EDS on 03/04/2015 PASARR number received on 03/04/2015  FL2 transmitted to all facilities in geographic area requested by pt/family on  03/04/2015 FL2 transmitted to all facilities within larger geographic area on   Patient informed that his/her managed care company has  contracts with or will negotiate with  certain facilities, including the following:     Patient/family informed of bed offers received:  03/05/2015 Patient chooses bed at  Physician recommends and patient chooses bed at    Patient to be transferred to  on   Patient to be transferred to facility by  Patient and family notified of transfer on  Name of family member notified:    The following physician request were entered in Epic:   Additional Comments:      Lincoln MaxinKelly Brooklee Michelin, LCSW [facility] Clinical Social Worker cell #: [phone]

## 2015-03-06 LAB — GLUCOSE, CAPILLARY
GLUCOSE-CAPILLARY: 91 mg/dL (ref 70–99)
GLUCOSE-CAPILLARY: 94 mg/dL (ref 70–99)
Glucose-Capillary: 110 mg/dL — ABNORMAL HIGH (ref 70–99)
Glucose-Capillary: 93 mg/dL (ref 70–99)

## 2015-03-06 LAB — BASIC METABOLIC PANEL
Anion gap: 10 (ref 5–15)
BUN: 17 mg/dL (ref 6–23)
CO2: 23 mmol/L (ref 19–32)
Calcium: 8.8 mg/dL (ref 8.4–10.5)
Chloride: 89 mmol/L — ABNORMAL LOW (ref 96–112)
Creatinine, Ser: 1.03 mg/dL (ref 0.50–1.10)
GFR, EST AFRICAN AMERICAN: 55 mL/min — AB (ref >90)
GFR, EST NON AFRICAN AMERICAN: 47 mL/min — AB (ref >90)
Glucose, Bld: 90 mg/dL (ref 70–99)
Potassium: 3.5 mmol/L (ref 3.5–5.1)
SODIUM: 122 mmol/L — AB (ref 135–145)

## 2015-03-06 LAB — SODIUM, URINE, RANDOM: Sodium, Ur: 109 mmol/L

## 2015-03-06 LAB — CBC
HCT: 27.8 % — ABNORMAL LOW (ref 36.0–46.0)
HEMOGLOBIN: 9.8 g/dL — AB (ref 12.0–15.0)
MCH: 35.1 pg — AB (ref 26.0–34.0)
MCHC: 35.3 g/dL (ref 30.0–36.0)
MCV: 99.6 fL (ref 78.0–100.0)
PLATELETS: 254 10*3/uL (ref 150–400)
RBC: 2.79 MIL/uL — AB (ref 3.87–5.11)
RDW: 12.9 % (ref 11.5–15.5)
WBC: 9.8 10*3/uL (ref 4.0–10.5)

## 2015-03-06 LAB — OSMOLALITY, URINE: Osmolality, Ur: 394 mOsm/kg (ref 390–1090)

## 2015-03-06 MED ORDER — AMOXICILLIN 500 MG PO CAPS
500.0000 mg | ORAL_CAPSULE | Freq: Three times a day (TID) | ORAL | Status: DC
  Administered 2015-03-06 – 2015-03-08 (×6): 500 mg via ORAL
  Filled 2015-03-06 (×9): qty 1
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
## 2015-03-07 LAB — GLUCOSE, CAPILLARY
GLUCOSE-CAPILLARY: 145 mg/dL — AB (ref 70–99)
Glucose-Capillary: 88 mg/dL (ref 70–99)
Glucose-Capillary: 102 mg/dL — ABNORMAL HIGH (ref 70–99)
Glucose-Capillary: 129 mg/dL — ABNORMAL HIGH (ref 70–99)

## 2015-03-07 LAB — BASIC METABOLIC PANEL
ANION GAP: 9 (ref 5–15)
BUN: 16 mg/dL (ref 6–23)
CHLORIDE: 97 mmol/L (ref 96–112)
CO2: 24 mmol/L (ref 19–32)
Calcium: 8.8 mg/dL (ref 8.4–10.5)
Creatinine, Ser: 0.9 mg/dL (ref 0.50–1.10)
GFR, EST AFRICAN AMERICAN: 65 mL/min — AB (ref >90)
GFR, EST NON AFRICAN AMERICAN: 56 mL/min — AB (ref >90)
Glucose, Bld: 92 mg/dL (ref 70–99)
POTASSIUM: 3.4 mmol/L — AB (ref 3.5–5.1)
Sodium: 130 mmol/L — ABNORMAL LOW (ref 135–145)

## 2015-03-07 LAB — MAGNESIUM: Magnesium: 1.5 mg/dL (ref 1.5–2.5)

## 2015-03-07 MED ORDER — AMLODIPINE BESYLATE 2.5 MG PO TABS
2.5000 mg | ORAL_TABLET | Freq: Every day | ORAL | Status: DC
  Administered 2015-03-07: 2.5 mg via ORAL
  Filled 2015-03-07 (×2): qty 1

## 2015-03-07 MED ORDER — MAGNESIUM SULFATE 2 GM/50ML IV SOLN
2.0000 g | Freq: Once | INTRAVENOUS | Status: AC
  Administered 2015-03-07: 2 g via INTRAVENOUS
  Filled 2015-03-07: qty 50

## 2015-03-07 MED ORDER — POTASSIUM CHLORIDE CRYS ER 20 MEQ PO TBCR
40.0000 meq | EXTENDED_RELEASE_TABLET | Freq: Once | ORAL | Status: AC
  Administered 2015-03-07: 40 meq via ORAL
  Filled 2015-03-07: qty 2

## 2015-03-07 MED ORDER — FLEET ENEMA 7-19 GM/118ML RE ENEM
1.0000 | ENEMA | Freq: Once | RECTAL | Status: AC
  Administered 2015-03-07: 1 via RECTAL
  Filled 2015-03-07: qty 1

## 2015-03-07 NOTE — Discharge Summary (Signed)
Physician Discharge Summary  Jillian Wheeler ZOX:[id number] DOB: 05-12-1925 DOA: 03/02/2015  PCP: Hoyle Sauer, MD  Admit date: 03/02/2015 Discharge date: 03/08/2015  Recommendations for Outpatient Follow-up:  1. Pt will need to follow up with PCP in 2 weeks post discharge 2. 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 -will add NaCl tabs if Na does not continue to improve -TSH 0.423 -continue IVF -Sodium 130 on the day of discharge Constipation -Patient had small BM 3/19 but still c/o feeling constipated -Continue bowel regimen -Fleets enema-->good results Chronic diastolic CHF -Compensated -Actually appears volume depleted still -Judicious IV fluids -03/04/2015 at the EF 60-65%, grade 1 diastolic dysfunction, no WMA UTI--E.coli -The patient was symptomatic with polyuria at the time of admission -Patient has been on ceftriaxone since 03/03/2015 -Switched to oral amoxicillin 3/19--plan to d/c with 2 additional days to complete 7 days of tx -Dysuria is improving Diabetes mellitus type 2  -CBGs well-controlled  -Hemoglobin A1c--pendng at time of d/c -Discontinue CBG checks at this point and allow for liberal diet  -will not discharge on any hypoglycemic agents HTN -BP labile but multiple SBP readings in 170s-180s -home with amlodipine 5 mg daily Hypokalemia  -Repleted -Check magnesium --1.5 Frequent falls/deconditioning -PT evaluation--> recommend skilled nursing facility -Partly due to hyponatremia  Discharge Condition: stable  Disposition: SNF  Diet:regular Wt Readings from Last 3 Encounters:  03/08/15 68.8 kg (151 lb 10.8 oz)  01/02/14 67.132 kg (148 lb)  12/27/13 71.215 kg (157 lb)    History of present illness:  81 year old female with a  history of diabetes mellitus, CKD stage III, B12 deficiency, hypertension, constipation presented with an unwitnessed fall, confusion, and low back pain. Radiographs were negative for lumbar spine fracture. The patient was noted to be hyponatremic with sodium of 114 at the time of admission. The patient was started on intravenous fluids and workup was undertaken. There is also history of poor oral intake prior to admission but no history of vomiting or diarrhea. There have been no new medications added. Since admission, the patient has gradually improved, but remains deconditioned. Physical therapy evaluated the patient and recommended skilled nursing facility which the family agrees.    Discharge Exam: Filed Vitals:   03/08/15 1022  BP: 168/82  Pulse:   Temp:   Resp:    Filed Vitals:   03/07/15 1418 03/07/15 2130 03/08/15 0500 03/08/15 1022  BP: 129/37 167/60 189/61 168/82  Pulse: 75 79 75   Temp: 97.6 F (36.4 C) 98 F (36.7 C) 97.8 F (36.6 C)   TempSrc: Oral Oral Oral   Resp: Height:      Weight:   68.8 kg (151 lb 10.8 oz)   SpO2: 100% 98% 97%    General: Awake and alert, NAD, pleasant, cooperative Cardiovascular: RRR, no rub, no gallop, no S3 Respiratory: Fine bibasilar crackles. No wheeze. Abdomen:soft, mild diffuse tenderness without guarding, nondistended, positive bowel sounds Extremities: No edema, No lymphangitis, no petechiae  Discharge Instructions      Discharge Instructions    Diet - low sodium heart healthy    Complete by:  As directed      Increase activity slowly    Complete by:  As directed             Medication List  TAKE these medications        acetaminophen 325 MG tablet  Commonly known as:  TYLENOL  Take 325 mg by mouth at bedtime. To be taken with Tramadol     allopurinol 100 MG tablet  Commonly known as:  ZYLOPRIM  Take 100 mg by mouth daily.     amLODipine 5 MG tablet  Commonly known as:  NORVASC  Take 1 tablet (5 mg  total) by mouth daily.  Start taking on:  03/09/2015     amoxicillin 500 MG capsule  Commonly known as:  AMOXIL  Take 1 capsule (500 mg total) by mouth every 8 (eight) hours.     atorvastatin 20 MG tablet  Commonly known as:  LIPITOR  Take 20 mg by mouth daily.     CALTRATE 600+D 600-400 MG-UNIT per tablet  Generic drug:  Calcium Carbonate-Vitamin D  Take 1 tablet by mouth 2 (two) times daily. At lunch and supper     ciprofloxacin 500 MG tablet  Commonly known as:  CIPRO  Take 1 tablet (500 mg total) by mouth 2 (two) times daily.     clobetasol ointment 0.05 %  Commonly known as:  TEMOVATE  Apply 1 application topically 2 (two) times daily as needed (rash).     clonazePAM 1 MG tablet  Commonly known as:  KLONOPIN  Take 1 mg by mouth See admin instructions. 1 tab at bedtime, may take 1/2 tab daily.     clonazePAM 2 MG tablet  Commonly known as:  KLONOPIN  Take 2 mg by mouth at bedtime.     cyanocobalamin 1000 MCG/ML injection  Commonly known as:  (VITAMIN B-12)  Inject 1,000 mcg into the muscle every 30 (thirty) days.     docusate sodium 100 MG capsule  Commonly known as:  COLACE  Take 1 capsule (100 mg total) by mouth 2 (two) times daily.     esomeprazole 40 MG capsule  Commonly known as:  NEXIUM  Take 40 mg by mouth daily at 12 noon.     HYDROcodone-acetaminophen 5-325 MG per tablet  Commonly known as:  NORCO/VICODIN  Take 1-1.5 tablets by mouth See admin instructions. Takes 1/2 tab at bedtime, may take an additional 1 tab in the night if needed. Also may take 1 tab during the day if needed.     levothyroxine 88 MCG tablet  Commonly known as:  SYNTHROID, LEVOTHROID  Take 88 mcg by mouth daily before breakfast.     LOVAZA PO  Take 1,200 mg by mouth daily.     Melatonin 5 MG Caps  Take 1 capsule by mouth at bedtime.     mirtazapine 15 MG tablet  Commonly known as:  REMERON  Take 15 mg by mouth at bedtime.     multivitamin tablet  Take 1 tablet by mouth at  bedtime.     pantoprazole 40 MG tablet  Commonly known as:  PROTONIX  Take 40 mg by mouth every morning.     PRESERVISION AREDS 2 Caps  Take 1 capsule by mouth 2 (two) times daily.     senna 8.6 MG Tabs tablet  Commonly known as:  SENOKOT  Take 1 tablet (8.6 mg total) by mouth daily.     traMADol 50 MG tablet  Commonly known as:  ULTRAM  Take 1 tablet by mouth every 8 (eight) hours as needed.         The results of significant diagnostics from this hospitalization (including imaging, microbiology, ancillary  and laboratory) are listed below for reference.    Significant Diagnostic Studies: Dg Chest 2 View  03/03/2015   CLINICAL DATA:  Acute onset of shortness of breath. Status post fall. Initial encounter.  EXAM: CHEST  2 VIEW  COMPARISON:  Chest radiograph performed 02/05/2015  FINDINGS: The lungs are well-aerated and clear. There is no evidence of focal opacification, pleural effusion or pneumothorax.  The heart is mildly enlarged. No acute osseous abnormalities are seen.  IMPRESSION: Mild cardiomegaly; lungs remain grossly clear. No displaced rib fracture seen.   Electronically Signed   By: Roanna Raider M.D.   On: 03/03/2015 02:04   Dg Lumbar Spine Complete  03/02/2015   CLINICAL DATA:  Larey Seat at 1900 hours  EXAM: LUMBAR SPINE - COMPLETE 4+ VIEW  COMPARISON:  None.  FINDINGS: There is old L5 compression and vertebroplasty. The other lumbar vertebrae are normal in height. Severe degenerative disc changes are present. Moderate left convex curvature is present. No bone lesion or bony destruction is evident. No acute fracture is evident.  IMPRESSION: Negative for acute lumbar spine fracture. Curvature and severe degenerative changes are present. Old L5 compression and vertebroplasty.   Electronically Signed   By: Ellery Plunk M.D.   On: 03/02/2015 23:56   Ct Head Wo Contrast  03/03/2015   CLINICAL DATA:  Larey Seat at 1900 hours  EXAM: CT HEAD WITHOUT CONTRAST  TECHNIQUE: Contiguous axial  images were obtained from the base of the skull through the vertex without intravenous contrast.  COMPARISON:  02/05/2015  FINDINGS: There is no intracranial hemorrhage, mass or evidence of acute infarction. There is moderate generalized atrophy. There is moderate chronic microvascular ischemic change. There is no significant extra-axial fluid collection.  No acute intracranial findings are evident. The calvarium and skullbase are intact.  IMPRESSION: Negative for acute intracranial traumatic injury. There is moderate chronic atrophy and microvascular disease.   Electronically Signed   By: Ellery Plunk M.D.   On: 03/03/2015 00:29   US Renal  03/03/2015   CLINICAL DATA:  Complex left upper pole renal lesion observed on CT  EXAM: RENAL/URINARY TRACT ULTRASOUND COMPLETE  COMPARISON:  CT 12/23/2014  FINDINGS: Right Kidney:  Length: 10.4 cm. Marked parenchymal thinning consistent with atrophy.  Left Kidney:  Length: 8.0 cm. Marked parenchymal thinning consistent with atrophy. The small focal lesion observed on CT is not visible on this study.  Bladder:  Appears normal for degree of bladder distention. Bilateral ureteral jets were documented.  IMPRESSION: Atrophic kidneys, without hydronephrosis. The small focal lesion observed on CT was not visible on this examination.   Electronically Signed   By: Ellery Plunk M.D.   On: 03/03/2015 04:20   Dg Hip Unilat With Pelvis 2-3 Views Right  03/02/2015   CLINICAL DATA:  Larey Seat at 1900 hours  EXAM: RIGHT HIP (WITH PELVIS) 2-3 VIEWS  COMPARISON:  None.  FINDINGS: Negative for acute fracture or dislocation. There is old calcification about the greater trochanter suggesting trochanteric bursitis. No bone lesion or bony destruction is evident.  IMPRESSION: Negative for acute fracture   Electronically Signed   By: Ellery Plunk M.D.   On: 03/02/2015 23:53     Microbiology: Recent Results (from the past 240 hour(s))  Culture, Urine     Status: None   Collection  Time: 03/02/15 11:36 PM  Result Value Ref Range Status   Specimen Description URINE, CLEAN CATCH  Final   Special Requests NONE  Final   Colony Count   Final    >=  100,000 COLONIES/ML Performed at [facility]    Culture   Final    ESCHERICHIA COLI Performed at [facility]    Report Status 03/05/2015 FINAL  Final   Organism ID, Bacteria ESCHERICHIA COLI  Final      Susceptibility   Escherichia coli - MIC*    AMPICILLIN <=2 SENSITIVE Sensitive     CEFAZOLIN <=4 SENSITIVE Sensitive     CEFTRIAXONE <=1 SENSITIVE Sensitive     CIPROFLOXACIN <=0.25 SENSITIVE Sensitive     GENTAMICIN <=1 SENSITIVE Sensitive     LEVOFLOXACIN <=0.12 SENSITIVE Sensitive     NITROFURANTOIN <=16 SENSITIVE Sensitive     TOBRAMYCIN <=1 SENSITIVE Sensitive     TRIMETH/SULFA <=20 SENSITIVE Sensitive     PIP/TAZO <=4 SENSITIVE Sensitive     * ESCHERICHIA COLI     Labs: Basic Metabolic Panel:  Recent Labs Lab 03/03/15 0413  03/04/15 0428 03/05/15 0500 03/06/15 0510 03/07/15 0509 03/08/15 0510  NA 119*  < > 122* 120* 122* 130* 130*  K 2.9*  < > 3.8 3.3* 3.5 3.4* 3.5  CL 88*  < > 93* 87* 89* 97 98  CO2 22  < > 23 24 23 24 24   GLUCOSE 128*  < > 109* 136* 90 92 105*  BUN 14  < > 11 11 17 16 9   CREATININE 0.88  < > 0.87 0.86 1.03 0.90 0.90  CALCIUM 8.5  < > 8.8 8.9 8.8 8.8 9.1  MG 1.5  --   --   --   --  1.5 2.0  PHOS 2.1*  --   --   --   --   --   --   < > = values in this interval not displayed. Liver Function Tests:  Recent Labs Lab 03/03/15 0135 03/03/15 0413  AST 26 28  ALT 21 23  ALKPHOS 67 67  BILITOT 0.7 0.4  PROT 5.8* 5.8*  ALBUMIN 3.2* 3.1*   No results for input(s): LIPASE, AMYLASE in the last 168 hours. No results for input(s): AMMONIA in the last 168 hours. CBC:  Recent Labs Lab 03/02/15 2227 03/03/15 0404 03/03/15 0413 03/04/15 0428 03/05/15 0500 03/06/15 0510  WBC 10.9* 11.3* 11.1* 11.2* 10.3 9.8  NEUTROABS 8.0*  --   --   --   --   --   HGB 10.9*  9.8* 9.5* 9.8* 10.4* 9.8*  HCT 30.0* 26.8* 25.8* 26.8* 28.9* 27.8*  MCV 96.5 97.1 97.0 98.5 98.0 99.6  PLT 290 231 270 248 262 254   Cardiac Enzymes: No results for input(s): CKTOTAL, CKMB, CKMBINDEX, TROPONINI in the last 168 hours. BNP: Invalid input(s): POCBNP CBG:  Recent Labs Lab 03/07/15 0751 03/07/15 1149 03/07/15 1650 03/07/15 2138 03/08/15 0748  GLUCAP 88 102* 129* 145* 101*    Time coordinating discharge:  Greater than 30 minutes  Signed:  Mikhai Bienvenue, DO [facility] Pager: [phone] 03/08/2015, 10:32 AM
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
## 2015-03-08 LAB — BASIC METABOLIC PANEL
Anion gap: 8 (ref 5–15)
BUN: 9 mg/dL (ref 6–23)
CHLORIDE: 98 mmol/L (ref 96–112)
CO2: 24 mmol/L (ref 19–32)
Calcium: 9.1 mg/dL (ref 8.4–10.5)
Creatinine, Ser: 0.9 mg/dL (ref 0.50–1.10)
GFR calc Af Amer: 65 mL/min — ABNORMAL LOW (ref >90)
GFR calc non Af Amer: 56 mL/min — ABNORMAL LOW (ref >90)
Glucose, Bld: 105 mg/dL — ABNORMAL HIGH (ref 70–99)
Potassium: 3.5 mmol/L (ref 3.5–5.1)
Sodium: 130 mmol/L — ABNORMAL LOW (ref 135–145)

## 2015-03-08 LAB — GLUCOSE, CAPILLARY
Glucose-Capillary: 101 mg/dL — ABNORMAL HIGH (ref 70–99)
Glucose-Capillary: 105 mg/dL — ABNORMAL HIGH (ref 70–99)

## 2015-03-08 LAB — MAGNESIUM: Magnesium: 2 mg/dL (ref 1.5–2.5)

## 2015-03-08 MED ORDER — AMLODIPINE BESYLATE 5 MG PO TABS: 5.0000 mg | ORAL_TABLET | Freq: Every day | ORAL | Status: DC

## 2015-03-08 MED ORDER — DOCUSATE SODIUM 100 MG PO CAPS: 100.0000 mg | ORAL_CAPSULE | Freq: Two times a day (BID) | ORAL | Status: DC

## 2015-03-08 MED ORDER — POTASSIUM CHLORIDE CRYS ER 20 MEQ PO TBCR
20.0000 meq | EXTENDED_RELEASE_TABLET | Freq: Once | ORAL | Status: AC
  Administered 2015-03-08: 20 meq via ORAL
  Filled 2015-03-08: qty 1

## 2015-03-08 MED ORDER — SENNA 8.6 MG PO TABS: 1.0000 | ORAL_TABLET | Freq: Every day | ORAL | Status: DC

## 2015-03-08 MED ORDER — AMOXICILLIN 500 MG PO CAPS: 500.0000 mg | ORAL_CAPSULE | Freq: Three times a day (TID) | ORAL | Status: DC

## 2015-03-08 MED ORDER — AMLODIPINE BESYLATE 5 MG PO TABS: 5.0000 mg | ORAL_TABLET | Freq: Every day | ORAL | Status: AC

## 2015-03-08 NOTE — Progress Notes (Addendum)
[facility]. Patient & son, [name] aware. Discharge packet given to RN, Dois DavenportSandra. Son to transport to SNF this afternoon.  Clinical Social Work Department CLINICAL SOCIAL WORK PLACEMENT NOTE 03/09/2015  Patient:  Jillian RiggsBRADY,Kyleeann C  Account Number:  [id number] Admit date:  03/02/2015  Clinical Social Worker:  Orpah GreekKELLY FOLEY, LCSWA  Date/time:  03/04/2015 02:20 PM  Clinical Social Work is seeking post-discharge placement for this patient at the following level of care:   SKILLED NURSING   (*CSW will update this form in Epic as items are completed)   03/04/2015  Patient/family provided with [facility] Department of Clinical Social Work's list of facilities offering this level of care within the geographic area requested by the patient (or if unable, by the patient's family).  03/04/2015  Patient/family informed of their freedom to choose among providers that offer the needed level of care, that participate in Medicare, Medicaid or managed care program needed by the patient, have an available bed and are willing to accept the patient.  03/04/2015  Patient/family informed of [facility]' ownership interest in [facility], as well as of the fact that they are under no obligation to receive care at this facility.  PASARR submitted to EDS on 03/04/2015 PASARR number received on 03/04/2015  FL2 transmitted to all facilities in geographic area requested by pt/family on  03/04/2015 FL2 transmitted to all facilities within larger geographic area on   Patient informed that his/her managed care company has contracts with or will negotiate with  certain facilities, including the following:     Patient/family informed of bed offers received:  03/05/2015 [facility] Physician recommends and patient chooses bed at    [facility] on  03/09/2015 Patient to be transferred to facility by patient's son,  [name] Patient and family notified of transfer on 03/09/2015 Name of family member notified:  patient's son, [name] via phone  The following physician request were entered in Epic:   Additional Comments:     Lincoln MaxinKelly Tahiry Spicer, LCSW [facility] Clinical Social Worker cell #: [phone]

## 2015-03-08 NOTE — Progress Notes (Signed)
Occupational Therapy Treatment Patient Details Name: Jillian Wheeler MRN: [id number] DOB: 07-11-1925 Today's Date: 03/08/2015    History of present illness 81 yo female admitted with asthma, hyponatremia. Hx of DM, back pain.    OT comments   encouraged pt to sit up in chair until after lunch.    Follow Up Recommendations  SNF;Supervision/Assistance - 24 hour    Equipment Recommendations   (TBA next venue)    Recommendations for Other Services      Precautions / Restrictions Precautions Precautions: Fall       Mobility Bed Mobility               General bed mobility comments: pt in chair  Transfers Overall transfer level: Needs assistance   Transfers: Sit to/from Stand Sit to Stand: Min assist                  ADL       Grooming: Wash/dry face;Oral care;Sitting                                 General ADL Comments: pt in chair after using BSC. Pt asking to go back to bed,  called RN for pain meds. Pt agreed to stay up as well as perform BUE exercise. Goal added.  Pt familair with BUE exercise as she does Tai Chi as senior center.        Vision                            Cognition   Behavior During Therapy: WFL for tasks assessed/performed Overall Cognitive Status: Within Functional Limits for tasks assessed                       Extremity/Trunk Assessment                     General Comments      Pertinent Vitals/ Pain       Pain Score: 7  Pain Location: back R side Pain Descriptors / Indicators: Aching;Sore Pain Intervention(s): Monitored during session;Limited activity within patient's tolerance;Patient requesting pain meds-RN notified;RN gave pain meds during session  Home Living                                          Prior Functioning/Environment              Frequency Min 2X/week     Progress Toward Goals  OT Goals(current goals can now be found in the care plan  section)     ADL Goals Additional ADL Goal #1: pt will perform BUE exercise program in sitting postiion to maintain strength and joint mobility with S  Plan      Co-evaluation                 End of Session     Activity Tolerance Patient limited by fatigue;Patient limited by pain   Patient Left with call bell/phone within reach;in chair;with chair alarm set   Nurse Communication          Time: [phone] OT Time Calculation (min): 17 min  Charges: OT General Charges $OT Visit: 1 Procedure OT Treatments $Therapeutic Activity: 8-22 mins  Jillian Wheeler D 03/08/2015, 1:00 PM

## 2015-03-08 NOTE — Progress Notes (Signed)
Physical Therapy Treatment Patient Details Name: Nilda Riggstta C Turgeon MRN: [id number] DOB: Mar 28, 1925 Today's Date: 03/08/2015    History of Present Illness 81 yo female admitted with asthma, hyponatremia. Hx of DM, back pain.     PT Comments    Assisted pt from bedside commode to bed, then instructed her in BLE strengthening exercises. She declined ambulation due to fear of falling. Noted pt to DC to SNF today.  Follow Up Recommendations  SNF;Supervision/Assistance - 24 hour     Equipment Recommendations  None recommended by PT    Recommendations for Other Services OT consult     Precautions / Restrictions Precautions Precautions: Fall Restrictions Weight Bearing Restrictions: No    Mobility  Bed Mobility               General bed mobility comments: pt on BSC  Transfers Overall transfer level: Needs assistance   Transfers: Sit to/from Stand;Stand Pivot Transfers Sit to Stand: Min assist Stand pivot transfers: Min assist       General transfer comment: Assist to rise, stabilize, control descent. VCs safety, technique, hand placement.   Ambulation/Gait                 Stairs            Wheelchair Mobility    Modified Rankin (Stroke Patients Only)       Balance                                    Cognition Arousal/Alertness: Awake/alert Behavior During Therapy: WFL for tasks assessed/performed Overall Cognitive Status: Within Functional Limits for tasks assessed                      Exercises General Exercises - Lower Extremity Ankle Circles/Pumps: AROM;Both;15 reps;Seated Long Arc Quad: AROM;Both;10 reps;Seated Hip Flexion/Marching: AROM;Both;10 reps;Seated    General Comments        Pertinent Vitals/Pain Pain Assessment: No/denies pain Pain Score: 7  Pain Location: back R side Pain Descriptors / Indicators: Aching;Sore Pain Intervention(s): Monitored during session;Limited activity within patient's  tolerance;Patient requesting pain meds-RN notified;RN gave pain meds during session    Home Living                      Prior Function            PT Goals (current goals can now be found in the care plan section) Acute Rehab PT Goals Patient Stated Goal: to get stronger PT Goal Formulation: With patient Time For Goal Achievement: 03/18/15 Potential to Achieve Goals: Good Progress towards PT goals: Progressing toward goals    Frequency  Min 3X/week    PT Plan Current plan remains appropriate    Co-evaluation             End of Session Equipment Utilized During Treatment: Gait belt Activity Tolerance: Patient tolerated treatment well (near syncopal episode) Patient left: with call bell/phone within reach;in bed;with family/visitor present     Time: [id number] PT Time Calculation (min) (ACUTE ONLY): 12 min  Charges:  $Therapeutic Activity: 8-22 mins                    G Codes:      Tamala SerUhlenberg, Ahaan Zobrist Kistler 03/08/2015, 1:05 PM [phone]

## 2015-03-09 ENCOUNTER — Encounter: Payer: Self-pay | Admitting: Adult Health

## 2015-03-09 ENCOUNTER — Non-Acute Institutional Stay: Payer: Commercial Managed Care - HMO | Admitting: Adult Health

## 2015-03-09 DIAGNOSIS — R5381 Other malaise: Secondary | ICD-10-CM

## 2015-03-09 DIAGNOSIS — F329 Major depressive disorder, single episode, unspecified: Secondary | ICD-10-CM | POA: Diagnosis not present

## 2015-03-09 DIAGNOSIS — K219 Gastro-esophageal reflux disease without esophagitis: Secondary | ICD-10-CM | POA: Diagnosis not present

## 2015-03-09 DIAGNOSIS — N390 Urinary tract infection, site not specified: Secondary | ICD-10-CM | POA: Diagnosis not present

## 2015-03-09 DIAGNOSIS — E119 Type 2 diabetes mellitus without complications: Secondary | ICD-10-CM

## 2015-03-09 DIAGNOSIS — E785 Hyperlipidemia, unspecified: Secondary | ICD-10-CM

## 2015-03-09 DIAGNOSIS — E871 Hypo-osmolality and hyponatremia: Secondary | ICD-10-CM

## 2015-03-09 DIAGNOSIS — F419 Anxiety disorder, unspecified: Secondary | ICD-10-CM | POA: Diagnosis not present

## 2015-03-09 DIAGNOSIS — G4700 Insomnia, unspecified: Secondary | ICD-10-CM

## 2015-03-09 DIAGNOSIS — I10 Essential (primary) hypertension: Secondary | ICD-10-CM

## 2015-03-09 DIAGNOSIS — F32A Depression, unspecified: Secondary | ICD-10-CM

## 2015-03-09 DIAGNOSIS — E039 Hypothyroidism, unspecified: Secondary | ICD-10-CM

## 2015-03-09 DIAGNOSIS — I5032 Chronic diastolic (congestive) heart failure: Secondary | ICD-10-CM

## 2015-03-09 DIAGNOSIS — M109 Gout, unspecified: Secondary | ICD-10-CM

## 2015-03-09 DIAGNOSIS — K5900 Constipation, unspecified: Secondary | ICD-10-CM | POA: Diagnosis not present

## 2015-03-09 LAB — HEMOGLOBIN A1C
Hgb A1c MFr Bld: 5.9 % — ABNORMAL HIGH (ref 4.8–5.6)
MEAN PLASMA GLUCOSE: 123 mg/dL

## 2015-03-09 NOTE — Progress Notes (Signed)
Patient ID: Jillian Wheeler, female   DOB: February 15, 1925, 81 y.o.   MRN: [id number]   03/09/2015  Facility:  Nursing Home Location:  [facility] Room Number: 1003-2 LEVEL OF CARE:  SNF (31)   Chief Complaint  Patient presents with  . Hospitalization Follow-up    Physical deconditioning, hyponatremia, hypertension, gout, UTI, hyperlipidemia, anxiety, constipation, hypothyroidism, insomnia, depression, GERD, chronic diastolic CHF and diabetes mellitus    HISTORY OF PRESENT ILLNESS:  This is an 81 year old female who has [facility]. She has PMH of diabetes mellitus, CK D stage III, B12 deficiency and hypertension. She presented to the hospital with an unwitnessed fall. She was diagnosed to have hyponatremia with sodium of 114. She had poor oral intake. IV fluids were given.  She has been admitted for a short-term rehabilitation.  PAST MEDICAL HISTORY:  Past Medical History  Diagnosis Date  . Palpitations   . Unspecified fall   . Altered mental status   . Other nonspecific finding on examination of urine   . Symptomatic menopausal or female climacteric states   . Insomnia, unspecified   . Contact dermatitis and other eczema, due to unspecified cause   . Type II or unspecified type diabetes mellitus with neurological manifestations, not stated as uncontrolled   . Dermatophytosis of nail   . Mastodynia   . Contact dermatitis and other eczema due to solvents   . Chronic kidney disease, stage III (moderate)   . Acute bronchitis   . Wheezing   . Pain in joint, pelvic region and thigh   . Other speech disturbance(784.59)   . Unspecified acute reaction to stress   . Other malaise and fatigue   . Spasm of muscle   . Blood in stool   . Other specified noninflammatory disorder of vagina   . Osteoarthrosis, unspecified whether generalized or localized, unspecified site   . Gout, unspecified   . Adjustment  disorder with mixed anxiety and depressed mood   . Other vitamin B12 deficiency anemia   . Obstructive sleep apnea (adult) (pediatric)   . Other dyspnea and respiratory abnormality   . Type II or unspecified type diabetes mellitus without mention of complication, not stated as uncontrolled   . Unspecified asthma(493.90)   . Esophageal reflux   . Other and unspecified hyperlipidemia   . Thyrotoxicosis without mention of goiter or other cause, without mention of thyrotoxic crisis or storm   . Unspecified essential hypertension     CURRENT MEDICATIONS: Reviewed per MAR/see medication list  Allergies  Allergen Reactions  . Aspirin Other (See Comments)    Upset stomach  . Celecoxib Other (See Comments)    Upset stomach  . Diclofenac Sodium Other (See Comments)    Pt does not remember   . Ibuprofen Other (See Comments)    Upset stomach   . Naproxen Other (See Comments)    Upset stomach   . Sulindac Other (See Comments)    Pt does not remember      REVIEW OF SYSTEMS:  GENERAL: no change in appetite, no fatigue, no weight changes, no fever, chills or weakness RESPIRATORY: no cough, SOB, DOE, wheezing, hemoptysis CARDIAC: no chest pain, edema or palpitations GI: no abdominal pain, diarrhea, heart burn, nausea or vomiting, +constipation  PHYSICAL EXAMINATION  GENERAL: no acute distress, normal body habitus EYES: conjunctivae normal, sclerae normal, normal eye lids NECK: supple, trachea midline, no neck masses, no thyroid  tenderness, no thyromegaly LYMPHATICS: no LAN in the neck, no supraclavicular LAN RESPIRATORY: breathing is even & unlabored, BS CTAB CARDIAC: RRR, no murmur,no extra heart sounds, no edema GI: abdomen soft, normal BS, no masses, no tenderness, no hepatomegaly, no splenomegaly EXTREMITIES: Able to move 4 extremities PSYCHIATRIC: the patient is alert & oriented to person, affect & behavior appropriate  LABS/RADIOLOGY: Labs reviewed: Basic Metabolic  Panel:  Recent Labs  03/03/15 0413  03/06/15 0510 03/07/15 0509 03/08/15 0510  NA 119*  < > 122* 130* 130*  K 2.9*  < > 3.5 3.4* 3.5  CL 88*  < > 89* 97 98  CO2 22  < > GLUCOSE 128*  < > 90 92 105*  BUN 14  < > CREATININE 0.88  < > 1.03 0.90 0.90  CALCIUM 8.5  < > 8.8 8.8 9.1  MG 1.5  --   --  1.5 2.0  PHOS 2.1*  --   --   --   --   < > = values in this interval not displayed. Liver Function Tests:  Recent Labs  03/03/15 0135 03/03/15 0413  AST 26 28  ALT 21 23  ALKPHOS 67 67  BILITOT 0.7 0.4  PROT 5.8* 5.8*  ALBUMIN 3.2* 3.1*   CBC:  Recent Labs  03/02/15 2227  03/04/15 0428 03/05/15 0500 03/06/15 0510  WBC 10.9*  < > 11.2* 10.3 9.8  NEUTROABS 8.0*  --   --   --   --   HGB 10.9*  < > 9.8* 10.4* 9.8*  HCT 30.0*  < > 26.8* 28.9* 27.8*  MCV 96.5  < > 98.5 98.0 99.6  PLT 290  < > 248 262 254  < > = values in this interval not displayed.  CBG:  Recent Labs  03/07/15 2138 03/08/15 0748 03/08/15 1125  GLUCAP 145* 101* 105*     Dg Chest 2 View  03/03/2015   CLINICAL DATA:  Acute onset of shortness of breath. Status post fall. Initial encounter.  EXAM: CHEST  2 VIEW  COMPARISON:  Chest radiograph performed 02/05/2015  FINDINGS: The lungs are well-aerated and clear. There is no evidence of focal opacification, pleural effusion or pneumothorax.  The heart is mildly enlarged. No acute osseous abnormalities are seen.  IMPRESSION: Mild cardiomegaly; lungs remain grossly clear. No displaced rib fracture seen.   Electronically Signed   By: Roanna Raider M.D.   On: 03/03/2015 02:04   Dg Lumbar Spine Complete  03/02/2015   CLINICAL DATA:  Larey Seat at 1900 hours  EXAM: LUMBAR SPINE - COMPLETE 4+ VIEW  COMPARISON:  None.  FINDINGS: There is old L5 compression and vertebroplasty. The other lumbar vertebrae are normal in height. Severe degenerative disc changes are present. Moderate left convex curvature is present. No bone lesion or bony destruction is  evident. No acute fracture is evident.  IMPRESSION: Negative for acute lumbar spine fracture. Curvature and severe degenerative changes are present. Old L5 compression and vertebroplasty.   Electronically Signed   By: Ellery Plunk M.D.   On: 03/02/2015 23:56   Ct Head Wo Contrast  03/03/2015   CLINICAL DATA:  Larey Seat at 1900 hours  EXAM: CT HEAD WITHOUT CONTRAST  TECHNIQUE: Contiguous axial images were obtained from the base of the skull through the vertex without intravenous contrast.  COMPARISON:  02/05/2015  FINDINGS: There is no intracranial hemorrhage, mass or evidence of acute infarction. There is moderate generalized atrophy. There  is moderate chronic microvascular ischemic change. There is no significant extra-axial fluid collection.  No acute intracranial findings are evident. The calvarium and skullbase are intact.  IMPRESSION: Negative for acute intracranial traumatic injury. There is moderate chronic atrophy and microvascular disease.   Electronically Signed   By: Ellery Plunk M.D.   On: 03/03/2015 00:29   US Renal  03/03/2015   CLINICAL DATA:  Complex left upper pole renal lesion observed on CT  EXAM: RENAL/URINARY TRACT ULTRASOUND COMPLETE  COMPARISON:  CT 12/23/2014  FINDINGS: Right Kidney:  Length: 10.4 cm. Marked parenchymal thinning consistent with atrophy.  Left Kidney:  Length: 8.0 cm. Marked parenchymal thinning consistent with atrophy. The small focal lesion observed on CT is not visible on this study.  Bladder:  Appears normal for degree of bladder distention. Bilateral ureteral jets were documented.  IMPRESSION: Atrophic kidneys, without hydronephrosis. The small focal lesion observed on CT was not visible on this examination.   Electronically Signed   By: Ellery Plunk M.D.   On: 03/03/2015 04:20   Dg Hip Unilat With Pelvis 2-3 Views Right  03/02/2015   CLINICAL DATA:  Larey Seat at 1900 hours  EXAM: RIGHT HIP (WITH PELVIS) 2-3 VIEWS  COMPARISON:  None.  FINDINGS: Negative for  acute fracture or dislocation. There is old calcification about the greater trochanter suggesting trochanteric bursitis. No bone lesion or bony destruction is evident.  IMPRESSION: Negative for acute fracture   Electronically Signed   By: Ellery Plunk M.D.   On: 03/02/2015 23:53    ASSESSMENT/PLAN:  Physical deconditioning - for rehabilitation Hyponatremia - NA 130; will monitor Hypertension - continue Norvasc 5 mg by mouth daily; BP every shift 1 week Gout - continue allopurinol 100 mg by mouth daily UTI - continue Amoxil 500 mg by mouth every 8 hours 2 days; discontinue Cipro Hyperlipidemia - continue Lipitor 20 mg by mouth daily and Lovaza 1200 mg by mouth daily Anxiety - mood is stable; continue Klonopin 2 mg by mouth daily at bedtime and 1 mg by mouth every morning Constipation - discontinue Colace and Senokot; start Senokot S2 tabs by mouth twice a day 4 days then daily at bedtime and MiraLAX 17 g +4-6 ounces liquid by mouth twice a day 4 days then every D Hypothyroidism - continue Synthroid 88 g by mouth daily Insomnia - continue melatonin 5 mg by mouth daily at bedtime Depression - continue Remeron 15 mg by mouth daily at bedtime GERD - continue Protonix 40 mg by mouth Chronic diastolic CHF - compensated Diabetes mellitus, type II - hemoglobin A1c 5.9; diet-controlled    Goals of care:  Short-term rehabilitation   Labs/test ordered:   CBC and CMP    Spent 50 minutes in patient care.    [facility], NP [employer] [phone]

## 2015-03-10 ENCOUNTER — Other Ambulatory Visit: Payer: Self-pay | Admitting: *Deleted

## 2015-03-11 ENCOUNTER — Non-Acute Institutional Stay (SKILLED_NURSING_FACILITY): Payer: Commercial Managed Care - HMO | Admitting: Internal Medicine

## 2015-03-11 DIAGNOSIS — D539 Nutritional anemia, unspecified: Secondary | ICD-10-CM

## 2015-03-11 DIAGNOSIS — E039 Hypothyroidism, unspecified: Secondary | ICD-10-CM | POA: Diagnosis not present

## 2015-03-11 DIAGNOSIS — F329 Major depressive disorder, single episode, unspecified: Secondary | ICD-10-CM

## 2015-03-11 DIAGNOSIS — K219 Gastro-esophageal reflux disease without esophagitis: Secondary | ICD-10-CM

## 2015-03-11 DIAGNOSIS — E871 Hypo-osmolality and hyponatremia: Secondary | ICD-10-CM | POA: Diagnosis not present

## 2015-03-11 DIAGNOSIS — M545 Low back pain, unspecified: Secondary | ICD-10-CM

## 2015-03-11 DIAGNOSIS — F411 Generalized anxiety disorder: Secondary | ICD-10-CM | POA: Diagnosis not present

## 2015-03-11 DIAGNOSIS — K5901 Slow transit constipation: Secondary | ICD-10-CM

## 2015-03-11 DIAGNOSIS — G4700 Insomnia, unspecified: Secondary | ICD-10-CM

## 2015-03-11 DIAGNOSIS — F32A Depression, unspecified: Secondary | ICD-10-CM

## 2015-03-11 DIAGNOSIS — K449 Diaphragmatic hernia without obstruction or gangrene: Secondary | ICD-10-CM

## 2015-03-11 DIAGNOSIS — B9620 Unspecified Escherichia coli [E. coli] as the cause of diseases classified elsewhere: Secondary | ICD-10-CM

## 2015-03-11 DIAGNOSIS — R5381 Other malaise: Secondary | ICD-10-CM | POA: Diagnosis not present

## 2015-03-11 DIAGNOSIS — E46 Unspecified protein-calorie malnutrition: Secondary | ICD-10-CM | POA: Diagnosis not present

## 2015-03-11 DIAGNOSIS — N390 Urinary tract infection, site not specified: Secondary | ICD-10-CM | POA: Diagnosis not present

## 2015-03-11 NOTE — Progress Notes (Signed)
Patient ID: Jillian Wheeler, female   DOB: 06/09/1925, 81 y.o.   MRN: [id number]     [facility]   PCP: Hoyle Sauer, MD  Code Status: DNR  Allergies  Allergen Reactions  . Aspirin Other (See Comments)    Upset stomach  . Celecoxib Other (See Comments)    Upset stomach  . Diclofenac Sodium Other (See Comments)    Pt does not remember   . Ibuprofen Other (See Comments)    Upset stomach   . Naproxen Other (See Comments)    Upset stomach   . Sulindac Other (See Comments)    Pt does not remember     Chief Complaint  Patient presents with  . New Admit To SNF     HPI:  81 year old patient is here for short term rehabilitation post hospital admission from 03/02/15-03/08/15 with unwitnessed fall and confusion. She also had low back pain, xray were negative for lumbar spine fracture. She was noticed to be hyponatremic and started on iv fluids. She was treated for e.coli uti as well.She had clinical improvement but was deconditioned. She was evaluated by therapy team in the hospital and SNF was recommended. She has PMH of diabetes mellitus, CKD stage III, B12 deficiency, hypertension, constipation. She is seen in her room today. She complaints of pain in her lower back. She mentions this is a chronic pain for her and she used to take tramadol 50 mg tid at home which helped. She complaints of heartburn bothering her this am. Has low energy level. She has not had a bowel movement since Sunday and complaints of bloating and discomfort in her abdomen. Appetite is fair.  Review of Systems:  Constitutional: positive for malaise and fatigue. Negative for fever, chills, diaphoresis.  HENT: Negative for headache, congestion Eyes: Negative for eye pain, blurred vision, double vision and discharge.  Respiratory: Negative for cough, shortness of breath and wheezing.   Cardiovascular: Negative for chest pain, palpitations, leg swelling.  Gastrointestinal:  Negative for nausea, vomiting. Passing flatus Genitourinary: Negative for dysuria and flank pain.  Musculoskeletal: Negative for falls in facility Skin: Negative for itching, rash.  Neurological: Negative for dizziness, tingling, focal weakness Psychiatric/Behavioral: Negative for depression   Past Medical History  Diagnosis Date  . Palpitations   . Unspecified fall   . Altered mental status   . Other nonspecific finding on examination of urine   . Symptomatic menopausal or female climacteric states   . Insomnia, unspecified   . Contact dermatitis and other eczema, due to unspecified cause   . Type II or unspecified type diabetes mellitus with neurological manifestations, not stated as uncontrolled   . Dermatophytosis of nail   . Mastodynia   . Contact dermatitis and other eczema due to solvents   . Chronic kidney disease, stage III (moderate)   . Acute bronchitis   . Wheezing   . Pain in joint, pelvic region and thigh   . Other speech disturbance(784.59)   . Unspecified acute reaction to stress   . Other malaise and fatigue   . Spasm of muscle   . Blood in stool   . Other specified noninflammatory disorder of vagina   . Osteoarthrosis, unspecified whether generalized or localized, unspecified site   . Gout, unspecified   . Adjustment disorder with mixed anxiety and depressed mood   . Other vitamin B12 deficiency anemia   . Obstructive sleep apnea (adult) (pediatric)   . Other dyspnea and respiratory  abnormality   . Type II or unspecified type diabetes mellitus without mention of complication, not stated as uncontrolled   . Unspecified asthma(493.90)   . Esophageal reflux   . Other and unspecified hyperlipidemia   . Thyrotoxicosis without mention of goiter or other cause, without mention of thyrotoxic crisis or storm   . Unspecified essential hypertension    No past surgical history on file. Social History:   reports that she has never smoked. She does not have any  smokeless tobacco history on file. She reports that she does not drink alcohol or use illicit drugs.  Family History  Problem Relation Age of Onset  . Cancer Sister   . Osteoarthritis Mother   . Heart disease    . Stroke Sister     Medications: Patient's Medications  New Prescriptions   No medications on file  Previous Medications   ACETAMINOPHEN (TYLENOL) 325 MG TABLET    Take 325 mg by mouth at bedtime. To be taken with Tramadol   ALLOPURINOL (ZYLOPRIM) 100 MG TABLET    Take 100 mg by mouth daily.    AMLODIPINE (NORVASC) 5 MG TABLET    Take 1 tablet (5 mg total) by mouth daily.   AMOXICILLIN (AMOXIL) 500 MG CAPSULE    Take 1 capsule (500 mg total) by mouth every 8 (eight) hours.   ATORVASTATIN (LIPITOR) 20 MG TABLET    Take 20 mg by mouth daily.    CALCIUM CARBONATE-VITAMIN D (CALTRATE 600+D) 600-400 MG-UNIT PER TABLET    Take 1 tablet by mouth 2 (two) times daily. At lunch and supper   CIPROFLOXACIN (CIPRO) 500 MG TABLET    Take 1 tablet (500 mg total) by mouth 2 (two) times daily.   CLOBETASOL OINTMENT (TEMOVATE) 0.05 %    Apply 1 application topically 2 (two) times daily as needed (rash).    CLONAZEPAM (KLONOPIN) 1 MG TABLET    Take 1 mg by mouth See admin instructions. 1 tab at bedtime, may take 1/2 tab daily.   CLONAZEPAM (KLONOPIN) 2 MG TABLET    Take 2 mg by mouth at bedtime.    CYANOCOBALAMIN (,VITAMIN B-12,) 1000 MCG/ML INJECTION    Inject 1,000 mcg into the muscle every 30 (thirty) days.   DOCUSATE SODIUM (COLACE) 100 MG CAPSULE    Take 1 capsule (100 mg total) by mouth 2 (two) times daily.   ESOMEPRAZOLE (NEXIUM) 40 MG CAPSULE    Take 40 mg by mouth daily at 12 noon.   HYDROCODONE-ACETAMINOPHEN (NORCO/VICODIN) 5-325 MG PER TABLET    Take 1-1.5 tablets by mouth See admin instructions. Takes 1/2 tab at bedtime, may take an additional 1 tab in the night if needed. Also may take 1 tab during the day if needed.   LEVOTHYROXINE (SYNTHROID, LEVOTHROID) 88 MCG TABLET    Take 88 mcg  by mouth daily before breakfast.   MELATONIN 5 MG CAPS    Take 1 capsule by mouth at bedtime.   MIRTAZAPINE (REMERON) 15 MG TABLET    Take 15 mg by mouth at bedtime.    MULTIPLE VITAMIN (MULTIVITAMIN) TABLET    Take 1 tablet by mouth at bedtime.    MULTIPLE VITAMINS-MINERALS (PRESERVISION AREDS 2) CAPS    Take 1 capsule by mouth 2 (two) times daily.   OMEGA-3-ACID ETHYL ESTERS (LOVAZA PO)    Take 1,200 mg by mouth daily.   PANTOPRAZOLE (PROTONIX) 40 MG TABLET    Take 40 mg by mouth every morning.   SENNA (SENOKOT) 8.6  MG TABS TABLET    Take 1 tablet (8.6 mg total) by mouth daily.   TRAMADOL (ULTRAM) 50 MG TABLET    Take 1 tablet by mouth every 8 (eight) hours as needed.   Modified Medications   No medications on file  Discontinued Medications   No medications on file     Physical Exam: Filed Vitals:   03/11/15 0947  BP: 120/60  Pulse: 81  Temp: 97.8 F (36.6 C)  Resp: 18  SpO2: 97%    General- elderly female, frail , in no acute distress Head- normocephalic, atraumatic Throat- moist mucus membrane Neck- no cervical lymphadenopathy Cardiovascular- normal s1,s2, no murmurs, palpable dorsalis pedis, no leg edema Respiratory- bilateral clear to auscultation, no wheeze, no rhonchi, no crackles, no use of accessory muscles Abdomen- bowel sounds present, soft, non tender, no guarding or rigidity, no CVA tenderness Musculoskeletal- able to move all 4 extremities, generalized weakness, no spinal tenderness  Neurological- no focal deficit Skin- warm and dry Psychiatry- alert and oriented to person, place and time, normal mood and affect    Labs reviewed: Basic Metabolic Panel:  Recent Labs  96/03/5402/16/16 0413  03/06/15 0510 03/07/15 0509 03/08/15 0510  NA 119*  < > 122* 130* 130*  K 2.9*  < > 3.5 3.4* 3.5  CL 88*  < > 89* 97 98  CO2 22  < > 23 24 24   GLUCOSE 128*  < > 90 92 105*  BUN 14  < > 17 16 9   CREATININE 0.88  < > 1.03 0.90 0.90  CALCIUM 8.5  < > 8.8 8.8 9.1  MG 1.5   --   --  1.5 2.0  PHOS 2.1*  --   --   --   --   < > = values in this interval not displayed. Liver Function Tests:  Recent Labs  03/03/15 0135 03/03/15 0413  AST 26 28  ALT 21 23  ALKPHOS 67 67  BILITOT 0.7 0.4  PROT 5.8* 5.8*  ALBUMIN 3.2* 3.1*   No results for input(s): LIPASE, AMYLASE in the last 8760 hours. No results for input(s): AMMONIA in the last 8760 hours. CBC:  Recent Labs  03/02/15 2227  03/04/15 0428 03/05/15 0500 03/06/15 0510  WBC 10.9*  < > 11.2* 10.3 9.8  NEUTROABS 8.0*  --   --   --   --   HGB 10.9*  < > 9.8* 10.4* 9.8*  HCT 30.0*  < > 26.8* 28.9* 27.8*  MCV 96.5  < > 98.5 98.0 99.6  PLT 290  < > 248 262 254  < > = values in this interval not displayed. Cardiac Enzymes: No results for input(s): CKTOTAL, CKMB, CKMBINDEX, TROPONINI in the last 8760 hours. BNP: Invalid input(s): POCBNP CBG:  Recent Labs  03/07/15 2138 03/08/15 0748 03/08/15 1125  GLUCAP 145* 101* 105*   03/10/15 wbc 2.53, hb 8.9, hct 25.9, mcv 102.4, na 132, k 4, bun 13, cr 1.12, alb 2.8  Assessment/Plan  Physical deconditioning Will have her work with physical therapy and occupational therapy team to help with gait training and muscle strengthening exercises.fall precautions. Skin care. Encourage to be out of bed.   Hyponatremia Alert and oriented at preset, improved na level on recheck in facility. Monitor clinically  Low back pain Persists, on tramadol 50 mg tid here with tylenol 325 mg qhs and norco 5-325 1/2-1 tab at bedtime with ? Once a day prn dosing. D/c norco bedtime and prn dosing. Change tramadol  to 100 mg tid for now and reassess. If no improvement, consider adding lidocaine patch to lower back area.  Constipation On senna s and miralax started 2 days back. Abdomen is soft on exam with bowel sounds present. Add dulcolax suppository x1 now.  Protein calorie malnutrition Low albumin level. Will get dietary consultation. Monitor weight.   gerd with hiatal  hernia On nexium 40 mg bid with morning dose at 8 am. change this to 40 mg daily at 6 am and continue afternoon dosing and reassess  Macrocytic Anemia On monthly b12 injection. Cbc in facility showing further drop in hb. Recheck cbc 03/15/15. Avoid NSAIDs. Continue nexium for now. Monitor for rectal bleed and hemoptysis  E.coli uti Completed her course of amoxicillin. currently asymptomatic, hydration encourage  Hypertension Stable. continue Norvasc 5 mg daily  Anxiety Stable on klonopin 1 mg am and 2 mg in pm  Hypothyroidism Continue levothyroxine 88 mcg daily  Depression On remeron 15 mg daily, monitor clinically  Insomnia continue melatonin 5 mg daily   Goals of care: short term rehabilitation   Labs/tests ordered: cbc, cmp  Family/ staff Communication: reviewed care plan with patient and nursing supervisor    Oneal Grout, MD  [facility] [phone] (Monday-Friday 8 am - 5 pm) [phone] (afterhours)

## 2015-03-12 ENCOUNTER — Ambulatory Visit (HOSPITAL_COMMUNITY): Admission: RE | Admit: 2015-03-12 | Payer: Commercial Managed Care - HMO | Source: Ambulatory Visit

## 2015-03-12 ENCOUNTER — Other Ambulatory Visit (HOSPITAL_COMMUNITY): Payer: Self-pay | Admitting: Internal Medicine

## 2015-03-16 ENCOUNTER — Other Ambulatory Visit: Payer: Self-pay

## 2015-03-16 MED ORDER — TRAMADOL HCL 50 MG PO TABS: 50.0000 mg | ORAL_TABLET | Freq: Three times a day (TID) | ORAL | Status: DC | PRN

## 2015-03-16 NOTE — Patient Outreach (Signed)
Phone call from patient's daughter Jillian Wheeler  on 03/15/15.  Per patient's daughter, patient is now receiving [facility].  Per patient's daughter, there are some concerns regarding patient's medications and personal items being misplaced.  Care planning meeting scheduled at [facility] on 03/18/15 at 1:30pm to discuss the above concerns as well as patient's long term plan.  This social worker will follow up with patient's daughter on 03/19/15 regarding meeting details and assistance needed from social work.       Adriana ReamsChrystal Land, LCSW [facility] [phone]

## 2015-03-16 NOTE — Telephone Encounter (Signed)
Rx faxed to [facility] @ [phone], phone number [phone]  

## 2015-03-17 ENCOUNTER — Other Ambulatory Visit: Payer: Self-pay | Admitting: *Deleted

## 2015-03-17 ENCOUNTER — Other Ambulatory Visit: Payer: Self-pay

## 2015-03-17 MED ORDER — CLONAZEPAM 1 MG PO TABS: ORAL_TABLET | ORAL | Status: DC

## 2015-03-17 MED ORDER — TRAMADOL HCL 50 MG PO TABS: ORAL_TABLET | ORAL | Status: DC

## 2015-03-17 NOTE — Telephone Encounter (Signed)
Rx faxed to [facility] @ [phone], phone number [phone]  

## 2015-03-17 NOTE — Patient Outreach (Signed)
Phone call to [facility][name] (Child psychotherapistsocial worker) to discuss patient's tentative discharge plan.  Care planning meeting remains as scheduled 03/18/15 at 1:30pm. Per Ardeen FillersBobetta, it is too early in her treatment to make a determination regarding what type of care patient will be needing following her rehabilitation stay.  Per Ardeen FillersBobetta,  if long term care or assisted living  is recommended, she will assist the family with options.  This social worker will go to see patient on 03/19/15.    Adriana ReamsChrystal Penelope Fittro, LCSW [facility] [phone]

## 2015-03-19 ENCOUNTER — Other Ambulatory Visit: Payer: Commercial Managed Care - HMO | Admitting: *Deleted

## 2015-03-19 NOTE — Patient Outreach (Signed)
[facility](THN) Care Management  [facility] Social Work  03/19/2015  Jillian Wheeler 1925/10/18 [id number]    Current Medications:  Current Outpatient Prescriptions  Medication Sig Dispense Refill  . acetaminophen (TYLENOL) 325 MG tablet Take 325 mg by mouth at bedtime. To be taken with Tramadol    . allopurinol (ZYLOPRIM) 100 MG tablet Take 100 mg by mouth daily.     Marland Kitchen. amLODipine (NORVASC) 5 MG tablet Take 1 tablet (5 mg total) by mouth daily. 30 tablet 1  . amoxicillin (AMOXIL) 500 MG capsule Take 1 capsule (500 mg total) by mouth every 8 (eight) hours. 6 capsule 0  . atorvastatin (LIPITOR) 20 MG tablet Take 20 mg by mouth daily.     . Calcium Carbonate-Vitamin D (CALTRATE 600+D) 600-400 MG-UNIT per tablet Take 1 tablet by mouth 2 (two) times daily. At lunch and supper    . ciprofloxacin (CIPRO) 500 MG tablet Take 1 tablet (500 mg total) by mouth 2 (two) times daily. 10 tablet 0  . clobetasol ointment (TEMOVATE) 0.05 % Apply 1 application topically 2 (two) times daily as needed (rash).     . clonazePAM (KLONOPIN) 1 MG tablet 2 by mouth every morning , 1 by mouth every night at bedtime 30 tablet 5  . clonazePAM (KLONOPIN) 2 MG tablet Take 2 mg by mouth at bedtime.     . cyanocobalamin (,VITAMIN B-12,) 1000 MCG/ML injection Inject 1,000 mcg into the muscle every 30 (thirty) days.    Marland Kitchen. docusate sodium (COLACE) 100 MG capsule Take 1 capsule (100 mg total) by mouth 2 (two) times daily. 60 capsule 0  . esomeprazole (NEXIUM) 40 MG capsule Take 40 mg by mouth daily at 12 noon.    Marland Kitchen. HYDROcodone-acetaminophen (NORCO/VICODIN) 5-325 MG per tablet Take 1-1.5 tablets by mouth See admin instructions. Takes 1/2 tab at bedtime, may take an additional 1 tab in the night if needed. Also may take 1 tab during the day if needed.    Marland Kitchen. levothyroxine (SYNTHROID, LEVOTHROID) 88 MCG tablet Take 88 mcg by mouth daily before breakfast.    . Melatonin 5 MG CAPS Take 1 capsule by mouth at bedtime.    .  mirtazapine (REMERON) 15 MG tablet Take 15 mg by mouth at bedtime.     . Multiple Vitamin (MULTIVITAMIN) tablet Take 1 tablet by mouth at bedtime.     . Multiple Vitamins-Minerals (PRESERVISION AREDS 2) CAPS Take 1 capsule by mouth 2 (two) times daily.    . Omega-3-acid Ethyl Esters (LOVAZA PO) Take 1,200 mg by mouth daily.    . pantoprazole (PROTONIX) 40 MG tablet Take 40 mg by mouth every morning.    . senna (SENOKOT) 8.6 MG TABS tablet Take 1 tablet (8.6 mg total) by mouth daily. 60 each 0  . traMADol (ULTRAM) 50 MG tablet 2 by mouth three times daily for back pain NOTE DOSE 180 tablet 5   No current facility-administered medications for this visit.    Functional Status:  In your present state of health, do you have any difficulty performing the following activities: 03/19/2015 03/03/2015  Is the patient deaf or have difficulty hearing? - Y  Hearing - N  Vision - N  Difficulty concentrating or making decisions - Y  Walking or climbing stairs? - N  Doing errands, shopping? - Y  Preparing Food and eating ? N -  Using the Toilet? Y -  Managing your Finances? Y -  Housekeeping or managing your Housekeeping? Y -  Fall/Depression Screening:  PHQ 2/9 Scores 03/19/2015  PHQ - 2 Score 0    Assessment: Visit to see patient at [facility].  Patient states that she would like to return home, however understands that she need 24 hour care and supervision.  Patient states that her son and daughter are actively looking for long term care options post discharge from the skilled nursing facility.  Patient's daughter Eudelia Bunch is her health care power of Attorney.  Spoke with social worker Maysel Mccolm who was able to confirm that the discharge plan for patient will most likely be Assisted Living.  Per [name],  patient's daughter and son are currently researching facilities.  Estimated length of stay 2-3 weeks.  Per patient, the facility found her hearing aids.  Plan:   This  Child psychotherapist will follow up with patient in one week regarding status of discharge plan.      Adriana Reams [facility] Care Management [phone]

## 2015-03-25 ENCOUNTER — Non-Acute Institutional Stay: Payer: Commercial Managed Care - HMO | Admitting: Adult Health

## 2015-03-25 ENCOUNTER — Encounter: Payer: Self-pay | Admitting: Adult Health

## 2015-03-25 ENCOUNTER — Other Ambulatory Visit: Payer: Self-pay | Admitting: *Deleted

## 2015-03-25 DIAGNOSIS — F32A Depression, unspecified: Secondary | ICD-10-CM

## 2015-03-25 DIAGNOSIS — I5032 Chronic diastolic (congestive) heart failure: Secondary | ICD-10-CM

## 2015-03-25 DIAGNOSIS — I10 Essential (primary) hypertension: Secondary | ICD-10-CM

## 2015-03-25 DIAGNOSIS — F419 Anxiety disorder, unspecified: Secondary | ICD-10-CM

## 2015-03-25 DIAGNOSIS — G4700 Insomnia, unspecified: Secondary | ICD-10-CM | POA: Diagnosis not present

## 2015-03-25 DIAGNOSIS — K219 Gastro-esophageal reflux disease without esophagitis: Secondary | ICD-10-CM | POA: Diagnosis not present

## 2015-03-25 DIAGNOSIS — K5900 Constipation, unspecified: Secondary | ICD-10-CM

## 2015-03-25 DIAGNOSIS — E039 Hypothyroidism, unspecified: Secondary | ICD-10-CM | POA: Diagnosis not present

## 2015-03-25 DIAGNOSIS — F329 Major depressive disorder, single episode, unspecified: Secondary | ICD-10-CM | POA: Diagnosis not present

## 2015-03-25 DIAGNOSIS — E785 Hyperlipidemia, unspecified: Secondary | ICD-10-CM

## 2015-03-25 DIAGNOSIS — R5381 Other malaise: Secondary | ICD-10-CM

## 2015-03-25 DIAGNOSIS — E871 Hypo-osmolality and hyponatremia: Secondary | ICD-10-CM | POA: Diagnosis not present

## 2015-03-25 DIAGNOSIS — E119 Type 2 diabetes mellitus without complications: Secondary | ICD-10-CM

## 2015-03-25 DIAGNOSIS — M109 Gout, unspecified: Secondary | ICD-10-CM

## 2015-03-25 NOTE — Patient Outreach (Signed)
[facility](THN) Care Management  03/25/2015  Jillian Wheeler 1925-09-12 [id number]   Collaboration phone call to patient's daughter Eudelia BunchJuanita Rumley.  Per Ms. Rumley, she will moving patient tomorrow [facility].  Per patient's daughter if the respite option goes well, they will look into long term care there as well.  This Child psychotherapistsocial worker will follow up with patient next week at [facility].   Adriana ReamsChrystal Land, LCSW [facility] Care Management [phone]

## 2015-03-25 NOTE — Progress Notes (Signed)
Patient ID: Jillian Wheeler, female   DOB: 02/08/1925, 81 y.o.   MRN: [id number]   03/25/2015  Facility:  Nursing Home Location:  [facility] Room Number: 1003-2 LEVEL OF CARE:  SNF (31)   Chief Complaint  Patient presents with  . Discharge Note    Physical deconditioning, hyponatremia, hypertension, gout, hyperlipidemia, anxiety, constipation, hypothyroidism, insomnia, depression, GERD, CHF and diabetes mellitus    HISTORY OF PRESENT ILLNESS:  This is an 81 year old female who is for discharge to ALF with home health PT for strengthening and balancing, OT for self care and nurse for disease management.  She has been admitted to [facility] on 03/08/15 from [facility]. She has PMH of diabetes mellitus, CK D stage III, B12 deficiency and hypertension. She presented to the hospital with an unwitnessed fall. She was diagnosed to have hyponatremia with sodium of 114. She had poor oral intake. IV fluids were given.  Patient was admitted to this facility for short-term rehabilitation after the patient's recent hospitalization.  Patient has completed SNF rehabilitation and therapy has cleared the patient for discharge.  PAST MEDICAL HISTORY:  Past Medical History  Diagnosis Date  . Palpitations   . Unspecified fall   . Altered mental status   . Other nonspecific finding on examination of urine   . Symptomatic menopausal or female climacteric states   . Insomnia, unspecified   . Contact dermatitis and other eczema, due to unspecified cause   . Type II or unspecified type diabetes mellitus with neurological manifestations, not stated as uncontrolled   . Dermatophytosis of nail   . Mastodynia   . Contact dermatitis and other eczema due to solvents   . Chronic kidney disease, stage III (moderate)   . Acute bronchitis   . Wheezing   . Pain in joint, pelvic region and thigh   . Other speech disturbance(784.59)   . Unspecified acute reaction to stress   .  Other malaise and fatigue   . Spasm of muscle   . Blood in stool   . Other specified noninflammatory disorder of vagina   . Osteoarthrosis, unspecified whether generalized or localized, unspecified site   . Gout, unspecified   . Adjustment disorder with mixed anxiety and depressed mood   . Other vitamin B12 deficiency anemia   . Obstructive sleep apnea (adult) (pediatric)   . Other dyspnea and respiratory abnormality   . Type II or unspecified type diabetes mellitus without mention of complication, not stated as uncontrolled   . Unspecified asthma(493.90)   . Esophageal reflux   . Other and unspecified hyperlipidemia   . Thyrotoxicosis without mention of goiter or other cause, without mention of thyrotoxic crisis or storm   . Unspecified essential hypertension     CURRENT MEDICATIONS: Reviewed per MAR/see medication list  Allergies  Allergen Reactions  . Aspirin Other (See Comments)    Upset stomach  . Celecoxib Other (See Comments)    Upset stomach  . Diclofenac Sodium Other (See Comments)    Pt does not remember   . Ibuprofen Other (See Comments)    Upset stomach   . Naproxen Other (See Comments)    Upset stomach   . Sulindac Other (See Comments)    Pt does not remember      REVIEW OF SYSTEMS:  GENERAL: no change in appetite, no fatigue, no weight changes, no fever, chills or weakness RESPIRATORY: no cough, SOB, DOE, wheezing, hemoptysis CARDIAC: no chest pain, edema  or palpitations GI: no abdominal pain, diarrhea, heart burn, nausea or vomiting  PHYSICAL EXAMINATION  GENERAL: no acute distress, normal body habitus NECK: supple, trachea midline, no neck masses, no thyroid tenderness, no thyromegaly LYMPHATICS: no LAN in the neck, no supraclavicular LAN RESPIRATORY: breathing is even & unlabored, BS CTAB CARDIAC: RRR, no murmur,no extra heart sounds, no edema GI: abdomen soft, normal BS, no masses, no tenderness, no hepatomegaly, no splenomegaly EXTREMITIES:  Able to move 4 extremities PSYCHIATRIC: the patient is alert & oriented to person, affect & behavior appropriate  LABS/RADIOLOGY: 03/18/15  sodium 130 potassium 4.0 glucose 89 BUN 23 creatinine 1.18 total bilirubin 0.3 alkaline phosphatase 58 SGOT 15 SGPT 15 total protein 5.2 albumin 3.0 calcium 9.8 03/15/15  WBC 10.7 hemoglobin 10.9 hematocrit 32.1 MCV 103.5 03/10/15  WBC 6.6 hemoglobin 8.9 hematocrit 25.9 MCV 102.4 sodium 132 potassium 4.0 glucose 89 BUN 13 creatinine 1.12 total bilirubin 0.2 alkaline phosphatase 54 SGOT 24 SGPT 21 total protein 4.7 albumin 2.8 calcium 9.3 Labs reviewed: Basic Metabolic Panel:  Recent Labs  16/10/96 0413  03/06/15 0510 03/07/15 0509 03/08/15 0510  NA 119*  < > 122* 130* 130*  K 2.9*  < > 3.5 3.4* 3.5  CL 88*  < > 89* 97 98  CO2 22  < > GLUCOSE 128*  < > 90 92 105*  BUN 14  < > CREATININE 0.88  < > 1.03 0.90 0.90  CALCIUM 8.5  < > 8.8 8.8 9.1  MG 1.5  --   --  1.5 2.0  PHOS 2.1*  --   --   --   --   < > = values in this interval not displayed. Liver Function Tests:  Recent Labs  03/03/15 0135 03/03/15 0413  AST 26 28  ALT 21 23  ALKPHOS 67 67  BILITOT 0.7 0.4  PROT 5.8* 5.8*  ALBUMIN 3.2* 3.1*   CBC:  Recent Labs  03/02/15 2227  03/04/15 0428 03/05/15 0500 03/06/15 0510  WBC 10.9*  < > 11.2* 10.3 9.8  NEUTROABS 8.0*  --   --   --   --   HGB 10.9*  < > 9.8* 10.4* 9.8*  HCT 30.0*  < > 26.8* 28.9* 27.8*  MCV 96.5  < > 98.5 98.0 99.6  PLT 290  < > 248 262 254  < > = values in this interval not displayed.  CBG:  Recent Labs  03/07/15 2138 03/08/15 0748 03/08/15 1125  GLUCAP 145* 101* 105*     Dg Chest 2 View  03/03/2015   CLINICAL DATA:  Acute onset of shortness of breath. Status post fall. Initial encounter.  EXAM: CHEST  2 VIEW  COMPARISON:  Chest radiograph performed 02/05/2015  FINDINGS: The lungs are well-aerated and clear. There is no evidence of focal opacification, pleural effusion or  pneumothorax.  The heart is mildly enlarged. No acute osseous abnormalities are seen.  IMPRESSION: Mild cardiomegaly; lungs remain grossly clear. No displaced rib fracture seen.   Electronically Signed   By: Roanna Raider M.D.   On: 03/03/2015 02:04   Dg Lumbar Spine Complete  03/02/2015   CLINICAL DATA:  Larey Seat at 1900 hours  EXAM: LUMBAR SPINE - COMPLETE 4+ VIEW  COMPARISON:  None.  FINDINGS: There is old L5 compression and vertebroplasty. The other lumbar vertebrae are normal in height. Severe degenerative disc changes are present. Moderate left convex curvature is present. No bone lesion or bony destruction  is evident. No acute fracture is evident.  IMPRESSION: Negative for acute lumbar spine fracture. Curvature and severe degenerative changes are present. Old L5 compression and vertebroplasty.   Electronically Signed   By: Ellery Plunkaniel R Mitchell M.D.   On: 03/02/2015 23:56   Ct Head Wo Contrast  03/03/2015   CLINICAL DATA:  Larey SeatFell at 1900 hours  EXAM: CT HEAD WITHOUT CONTRAST  TECHNIQUE: Contiguous axial images were obtained from the base of the skull through the vertex without intravenous contrast.  COMPARISON:  02/05/2015  FINDINGS: There is no intracranial hemorrhage, mass or evidence of acute infarction. There is moderate generalized atrophy. There is moderate chronic microvascular ischemic change. There is no significant extra-axial fluid collection.  No acute intracranial findings are evident. The calvarium and skullbase are intact.  IMPRESSION: Negative for acute intracranial traumatic injury. There is moderate chronic atrophy and microvascular disease.   Electronically Signed   By: Ellery Plunkaniel R Mitchell M.D.   On: 03/03/2015 00:29   [place] Renal  03/03/2015   CLINICAL DATA:  Complex left upper pole renal lesion observed on CT  EXAM: RENAL/URINARY TRACT ULTRASOUND COMPLETE  COMPARISON:  CT 12/23/2014  FINDINGS: Right Kidney:  Length: 10.4 cm. Marked parenchymal thinning consistent with atrophy.  Left Kidney:   Length: 8.0 cm. Marked parenchymal thinning consistent with atrophy. The small focal lesion observed on CT is not visible on this study.  Bladder:  Appears normal for degree of bladder distention. Bilateral ureteral jets were documented.  IMPRESSION: Atrophic kidneys, without hydronephrosis. The small focal lesion observed on CT was not visible on this examination.   Electronically Signed   By: Ellery Plunkaniel R Mitchell M.D.   On: 03/03/2015 04:20   Dg Hip Unilat With Pelvis 2-3 Views Right  03/02/2015   CLINICAL DATA:  Larey SeatFell at 1900 hours  EXAM: RIGHT HIP (WITH PELVIS) 2-3 VIEWS  COMPARISON:  None.  FINDINGS: Negative for acute fracture or dislocation. There is old calcification about the greater trochanter suggesting trochanteric bursitis. No bone lesion or bony destruction is evident.  IMPRESSION: Negative for acute fracture   Electronically Signed   By: Ellery Plunkaniel R Mitchell M.D.   On: 03/02/2015 23:53    ASSESSMENT/PLAN:  Physical deconditioning - for home health PT, OT and nursing Hyponatremia - NA 130 Hypertension - continue Norvasc 5 mg by mouth daily Gout - continue allopurinol 100 mg by mouth daily Hyperlipidemia - continue Lipitor 20 mg by mouth daily and Lovaza 1200 mg by mouth daily Anxiety - mood is stable; continue Klonopin 1 mg by mouth daily at bedtime  Constipation - continue Senokot S2 tabs by mouth  at bedtime and MiraLAX 17 g +4-6 ounces liquid by mouth Q D Hypothyroidism - continue Synthroid 88 g by mouth daily Insomnia - continue melatonin 5 mg by mouth daily at bedtime Depression - mood is stable; recently discontued Remeron 15 mg by mouth daily at bedtime GERD - continue Protonix 40 mg by mouth Chronic diastolic CHF - compensated Diabetes mellitus, type II - hemoglobin A1c 5.9; diet-controlled    I have filled out patient's discharge paperwork and written prescriptions.  Patient will receive home health PT, OT and Nursing.  Total discharge time: Greater  than 30  minutes  Discharge time involved coordination of the discharge process with social worker, nursing staff and therapy department. Medical justification for home health services verified.    [facility] Center[facility]-VARGAS,Bryleigh Ottaway, NP [facility] [phone]

## 2015-03-26 ENCOUNTER — Other Ambulatory Visit: Payer: Self-pay | Admitting: *Deleted

## 2015-03-26 ENCOUNTER — Telehealth: Payer: Self-pay | Admitting: *Deleted

## 2015-03-26 NOTE — Patient Outreach (Signed)
[facility](THN) Care Management  03/26/2015  Jillian Wheeler 04/12/1925 [id number]  Phone call from [facility] [phone] discharge pl[facility]  She will be there under respite care initially.  If the respite care goes well, she will stay long term.   Adriana ReamsChrystal Land, LCSW [facility] Care Management [phone]

## 2015-03-26 NOTE — Telephone Encounter (Signed)
Received a fax from [facility] #: [phone] Fax: [phone] from Lillia AbedLindsay wanting us to fill out an FL2 Form and write orders for patient. I called [facility] and spoke with Sue Lushndrea and informed her that they would have to send the paperwork to [facility] to have filled out or to Primary or patient could establish with out practice. She stated that she will inform Lillia AbedLindsay.

## 2015-04-12 ENCOUNTER — Other Ambulatory Visit: Payer: Self-pay | Admitting: *Deleted

## 2015-04-12 NOTE — Patient Outreach (Signed)
[facility](THN) Care Management  04/12/2015  Jillian Wheeler Dec 17, 1925 [id number]    Phone call to patient's daiughter, Jillian Wheeler.  Per patient's daughter, she [facility].  Per patient's daughter, she is well taken care of there.  Currently receiving, physical therapy at this facility as well. Per patient's daughter, she will likely be there long term.  Home visit scheduled for 04/15/15 at 11:00am for discharge home visit.   Jillian ReamsChrystal Land, LCSW [facility] Care Management [phone]

## 2015-04-15 ENCOUNTER — Other Ambulatory Visit: Payer: Commercial Managed Care - HMO | Admitting: *Deleted

## 2015-04-15 NOTE — Patient Outreach (Signed)
[facility]) Care Management  [facility] Social Work  04/15/2015  Jillian Wheeler 25-Jul-1925 [id number]    Current Medications:  Current Outpatient Prescriptions  Medication Sig Dispense Refill  . acetaminophen (TYLENOL) 325 MG tablet Take 325 mg by mouth at bedtime. To be taken with Tramadol    . allopurinol (ZYLOPRIM) 100 MG tablet Take 100 mg by mouth daily.     Marland Kitchen amLODipine (NORVASC) 5 MG tablet Take 1 tablet (5 mg total) by mouth daily. 30 tablet 1  . amoxicillin (AMOXIL) 500 MG capsule Take 1 capsule (500 mg total) by mouth every 8 (eight) hours. 6 capsule 0  . atorvastatin (LIPITOR) 20 MG tablet Take 20 mg by mouth daily.     . Calcium Carbonate-Vitamin D (CALTRATE 600+D) 600-400 MG-UNIT per tablet Take 1 tablet by mouth 2 (two) times daily. At lunch and supper    . ciprofloxacin (CIPRO) 500 MG tablet Take 1 tablet (500 mg total) by mouth 2 (two) times daily. 10 tablet 0  . clobetasol ointment (TEMOVATE) 7.65 % Apply 1 application topically 2 (two) times daily as needed (rash).     . clonazePAM (KLONOPIN) 1 MG tablet 2 by mouth every morning , 1 by mouth every night at bedtime 30 tablet 5  . clonazePAM (KLONOPIN) 2 MG tablet Take 2 mg by mouth at bedtime.     . cyanocobalamin (,VITAMIN B-12,) 1000 MCG/ML injection Inject 1,000 mcg into the muscle every 30 (thirty) days.    Marland Kitchen docusate sodium (COLACE) 100 MG capsule Take 1 capsule (100 mg total) by mouth 2 (two) times daily. 60 capsule 0  . esomeprazole (NEXIUM) 40 MG capsule Take 40 mg by mouth daily at 12 noon.    Marland Kitchen HYDROcodone-acetaminophen (NORCO/VICODIN) 5-325 MG per tablet Take 1-1.5 tablets by mouth See admin instructions. Takes 1/2 tab at bedtime, may take an additional 1 tab in the night if needed. Also may take 1 tab during the day if needed.    Marland Kitchen levothyroxine (SYNTHROID, LEVOTHROID) 88 MCG tablet Take 88 mcg by mouth daily before breakfast.    . Melatonin 5 MG CAPS Take 1 capsule by mouth at bedtime.    .  mirtazapine (REMERON) 15 MG tablet Take 15 mg by mouth at bedtime.     . Multiple Vitamin (MULTIVITAMIN) tablet Take 1 tablet by mouth at bedtime.     . Multiple Vitamins-Minerals (PRESERVISION AREDS 2) CAPS Take 1 capsule by mouth 2 (two) times daily.    . Omega-3-acid Ethyl Esters (LOVAZA PO) Take 1,200 mg by mouth daily.    . pantoprazole (PROTONIX) 40 MG tablet Take 40 mg by mouth every morning.    . senna (SENOKOT) 8.6 MG TABS tablet Take 1 tablet (8.6 mg total) by mouth daily. 60 each 0  . traMADol (ULTRAM) 50 MG tablet 2 by mouth three times daily for back pain NOTE DOSE 180 tablet 5   No current facility-administered medications for this visit.    Functional Status:  In your present state of health, do you have any difficulty performing the following activities: 03/19/2015 03/03/2015  Hearing? - Y  Vision? - N  Difficulty concentrating or making decisions? - N  Walking or climbing stairs? - Y  Dressing or bathing? - N  Doing errands, shopping? - Y  Preparing Food and eating ? N -  Using the Toilet? Y -  Managing your Finances? Y -  Housekeeping or managing your Housekeeping? Y -    Fall/Depression Screening:  PHQ  2/9 Scores 03/19/2015  PHQ - 2 Score 0    Assessment:  This Education officer, museum met with patient a [facility] where she is residing in long term respite care.  Patient frustrated that her Physical Therapy has not been authorized.  Patient eager to start physical therapy in hopes to get strong enough to attend her family reunion as well as to eventually return home.  Patient very tearful when discussing missing home and social life.  Patient reports making every effort to participate in the activities provided at [facility] until her physical therapy can start.  Patient wants to return home eventually, however her family does not think it would be safe to return due to need for 24 hour supervision.  Per patient, she is not sure at this time what the long term plan will be  for her.   Plan: Social Worker emphasized continued participation in facility activities to increase socialization and ability to adjust to facility care.  Social Worker to request collaborative family meeting in approximately three weeks at the facility  to discuss mutually agreed upon long term plan before patient's chart is closed.    [name] [facility] Care Management [phone]

## 2015-04-21 ENCOUNTER — Ambulatory Visit: Payer: Medicare HMO | Admitting: Podiatrist

## 2015-04-22 ENCOUNTER — Other Ambulatory Visit: Payer: Self-pay | Admitting: *Deleted

## 2015-04-22 NOTE — Patient Outreach (Signed)
[facility](THN) Care Management  04/22/2015  Nilda Riggstta C Miralles 21-Aug-1925 [id number]   Phone call from patient's daughter stating Eudelia BunchJuanita Rumley with concerns that patient's physical therapy had not started yet for [facility].  This Child psychotherapistsocial worker contacted De BlanchJim Stovall Engineering geologist(Marketing Manager) at daughter's request to discuss delay.  Per De BlanchJim Stovall, patient's insurance company as authorized Occupational therapy and home health services only.  Occupational therapy has stated and has worked with patient three times.  Patient's family frustrated with the delays and benefit coverage.  It was suggested by De BlanchJim Stovall, that family contact [employer] Insurance Information Program [facility](SHIP) to explore alternative coverage plans. The Director of [facility] will be contacting patient's daughter and son to further explain authorization details.  It was confirmed that patient has entered from respite care to a permanent contract at [facility].  Case closure to be performed.   Adriana ReamsChrystal Keegan Ducey, LCSW [facility] Care Management [phone]

## 2015-04-22 NOTE — Patient Outreach (Signed)
[facility](THN) Care Management  04/22/2015  Nilda Riggstta C Berrian Jul 05, 1925 [id number]   Follow up phone call to patient's daughter Eudelia BunchJuanita Rumley regarding status of physical therapy for patient while at [facility].  HIPPA compliant voicemail message left for a return call.   Adriana ReamsChrystal Jarika Robben, LCSW [facility] Care Management [phone]

## 2015-04-29 ENCOUNTER — Encounter: Payer: Self-pay | Admitting: *Deleted

## 2015-04-29 ENCOUNTER — Other Ambulatory Visit: Payer: Self-pay | Admitting: *Deleted

## 2015-04-29 NOTE — Patient Outreach (Signed)
[facility]) Care Management  04/29/2015  Jillian Wheeler 09-08-1925 [id number]    Phone call from patient's daughter [name].  Daughter confirmed that patient will remain at [facility] until further notice.  Patient has been authorized for physical and occupational therapy while at [facility].  Per daughter voiced frustration regarding patient's care and has met with facility Director to voice her concerns. Patient's son  was able to conference in for meeting as [place] of [place].    This Education officer, museum explained that due to the decision being made for patient to remain in long term care, patient's case to be closed to social work at this time.   [name] [facility] Care Management [phone]

## 2015-05-06 ENCOUNTER — Inpatient Hospital Stay (HOSPITAL_COMMUNITY)
Admission: EM | Admit: 2015-05-06 | Discharge: 2015-05-13 | DRG: 690 | Disposition: A | Discharge status: Nursing Home (Includes ICF) | Payer: Commercial Managed Care - HMO | Attending: Internal Medicine | Admitting: Internal Medicine

## 2015-05-06 ENCOUNTER — Emergency Department (HOSPITAL_COMMUNITY): Payer: Commercial Managed Care - HMO

## 2015-05-06 ENCOUNTER — Encounter (HOSPITAL_COMMUNITY): Payer: Self-pay

## 2015-05-06 DIAGNOSIS — R109 Unspecified abdominal pain: Secondary | ICD-10-CM | POA: Diagnosis present

## 2015-05-06 DIAGNOSIS — G4733 Obstructive sleep apnea (adult) (pediatric): Secondary | ICD-10-CM | POA: Diagnosis present

## 2015-05-06 DIAGNOSIS — E871 Hypo-osmolality and hyponatremia: Secondary | ICD-10-CM | POA: Diagnosis present

## 2015-05-06 DIAGNOSIS — R634 Abnormal weight loss: Secondary | ICD-10-CM | POA: Diagnosis present

## 2015-05-06 DIAGNOSIS — E876 Hypokalemia: Secondary | ICD-10-CM | POA: Diagnosis present

## 2015-05-06 DIAGNOSIS — K219 Gastro-esophageal reflux disease without esophagitis: Secondary | ICD-10-CM | POA: Diagnosis not present

## 2015-05-06 DIAGNOSIS — E869 Volume depletion, unspecified: Secondary | ICD-10-CM | POA: Diagnosis present

## 2015-05-06 DIAGNOSIS — E039 Hypothyroidism, unspecified: Secondary | ICD-10-CM | POA: Diagnosis present

## 2015-05-06 DIAGNOSIS — R531 Weakness: Secondary | ICD-10-CM | POA: Diagnosis present

## 2015-05-06 DIAGNOSIS — R1084 Generalized abdominal pain: Secondary | ICD-10-CM | POA: Diagnosis not present

## 2015-05-06 DIAGNOSIS — D649 Anemia, unspecified: Secondary | ICD-10-CM | POA: Diagnosis present

## 2015-05-06 DIAGNOSIS — K8020 Calculus of gallbladder without cholecystitis without obstruction: Secondary | ICD-10-CM | POA: Diagnosis present

## 2015-05-06 DIAGNOSIS — E785 Hyperlipidemia, unspecified: Secondary | ICD-10-CM | POA: Diagnosis not present

## 2015-05-06 DIAGNOSIS — E1149 Type 2 diabetes mellitus with other diabetic neurological complication: Secondary | ICD-10-CM | POA: Diagnosis present

## 2015-05-06 DIAGNOSIS — Z9181 History of falling: Secondary | ICD-10-CM | POA: Diagnosis not present

## 2015-05-06 DIAGNOSIS — I129 Hypertensive chronic kidney disease with stage 1 through stage 4 chronic kidney disease, or unspecified chronic kidney disease: Secondary | ICD-10-CM | POA: Diagnosis present

## 2015-05-06 DIAGNOSIS — K589 Irritable bowel syndrome without diarrhea: Secondary | ICD-10-CM | POA: Diagnosis present

## 2015-05-06 DIAGNOSIS — I5032 Chronic diastolic (congestive) heart failure: Secondary | ICD-10-CM | POA: Diagnosis not present

## 2015-05-06 DIAGNOSIS — R1030 Lower abdominal pain, unspecified: Secondary | ICD-10-CM | POA: Diagnosis not present

## 2015-05-06 DIAGNOSIS — N390 Urinary tract infection, site not specified: Secondary | ICD-10-CM | POA: Diagnosis present

## 2015-05-06 DIAGNOSIS — E222 Syndrome of inappropriate secretion of antidiuretic hormone: Secondary | ICD-10-CM | POA: Diagnosis present

## 2015-05-06 DIAGNOSIS — Z66 Do not resuscitate: Secondary | ICD-10-CM | POA: Diagnosis present

## 2015-05-06 DIAGNOSIS — N183 Chronic kidney disease, stage 3 (moderate): Secondary | ICD-10-CM | POA: Diagnosis present

## 2015-05-06 DIAGNOSIS — Z22322 Carrier or suspected carrier of Methicillin resistant Staphylococcus aureus: Secondary | ICD-10-CM

## 2015-05-06 DIAGNOSIS — B961 Klebsiella pneumoniae [K. pneumoniae] as the cause of diseases classified elsewhere: Secondary | ICD-10-CM | POA: Diagnosis present

## 2015-05-06 DIAGNOSIS — R627 Adult failure to thrive: Secondary | ICD-10-CM | POA: Diagnosis present

## 2015-05-06 DIAGNOSIS — R630 Anorexia: Secondary | ICD-10-CM | POA: Diagnosis present

## 2015-05-06 HISTORY — DX: Syndrome of inappropriate secretion of antidiuretic hormone: E22.2

## 2015-05-06 HISTORY — DX: Carrier or suspected carrier of methicillin resistant Staphylococcus aureus: Z22.322

## 2015-05-06 LAB — URINALYSIS, ROUTINE W REFLEX MICROSCOPIC
BILIRUBIN URINE: NEGATIVE
Glucose, UA: NEGATIVE mg/dL
Ketones, ur: 15 mg/dL — AB
Nitrite: NEGATIVE
Protein, ur: 100 mg/dL — AB
SPECIFIC GRAVITY, URINE: 1.02 (ref 1.005–1.030)
Urobilinogen, UA: 0.2 mg/dL (ref 0.0–1.0)
pH: 7 (ref 5.0–8.0)

## 2015-05-06 LAB — URINE MICROSCOPIC-ADD ON

## 2015-05-06 LAB — COMPREHENSIVE METABOLIC PANEL
ALBUMIN: 3.6 g/dL (ref 3.5–5.0)
ALK PHOS: 85 U/L (ref 38–126)
ALT: 20 U/L (ref 14–54)
AST: 23 U/L (ref 15–41)
Anion gap: 12 (ref 5–15)
BILIRUBIN TOTAL: 0.6 mg/dL (ref 0.3–1.2)
BUN: 13 mg/dL (ref 6–20)
CHLORIDE: 81 mmol/L — AB (ref 101–111)
CO2: 25 mmol/L (ref 22–32)
CREATININE: 0.93 mg/dL (ref 0.44–1.00)
Calcium: 9.6 mg/dL (ref 8.9–10.3)
GFR, EST NON AFRICAN AMERICAN: 54 mL/min — AB (ref >60)
Glucose, Bld: 124 mg/dL — ABNORMAL HIGH (ref 65–99)
Potassium: 3.1 mmol/L — ABNORMAL LOW (ref 3.5–5.1)
Sodium: 118 mmol/L — CL (ref 135–145)
TOTAL PROTEIN: 6.7 g/dL (ref 6.5–8.1)

## 2015-05-06 LAB — CBC
HCT: 31.6 % — ABNORMAL LOW (ref 36.0–46.0)
Hemoglobin: 11.4 g/dL — ABNORMAL LOW (ref 12.0–15.0)
MCH: 35 pg — ABNORMAL HIGH (ref 26.0–34.0)
MCHC: 36.1 g/dL — ABNORMAL HIGH (ref 30.0–36.0)
MCV: 96.9 fL (ref 78.0–100.0)
PLATELETS: 338 10*3/uL (ref 150–400)
RBC: 3.26 MIL/uL — ABNORMAL LOW (ref 3.87–5.11)
RDW: 12.7 % (ref 11.5–15.5)
WBC: 10.2 10*3/uL (ref 4.0–10.5)

## 2015-05-06 LAB — GLUCOSE, CAPILLARY: Glucose-Capillary: 192 mg/dL — ABNORMAL HIGH (ref 65–99)

## 2015-05-06 MED ORDER — AMLODIPINE BESYLATE 5 MG PO TABS
5.0000 mg | ORAL_TABLET | Freq: Every day | ORAL | Status: DC
  Administered 2015-05-07 – 2015-05-13 (×7): 5 mg via ORAL
  Filled 2015-05-06 (×7): qty 1

## 2015-05-06 MED ORDER — ONDANSETRON HCL 4 MG/2ML IJ SOLN: 4.0000 mg | Freq: Four times a day (QID) | INTRAMUSCULAR | Status: DC | PRN

## 2015-05-06 MED ORDER — ONDANSETRON HCL 4 MG/2ML IJ SOLN
4.0000 mg | Freq: Once | INTRAMUSCULAR | Status: AC
  Administered 2015-05-06: 4 mg via INTRAVENOUS
  Filled 2015-05-06: qty 2

## 2015-05-06 MED ORDER — LEVOTHYROXINE SODIUM 88 MCG PO TABS
88.0000 ug | ORAL_TABLET | Freq: Every day | ORAL | Status: DC
  Administered 2015-05-07 – 2015-05-13 (×7): 88 ug via ORAL
  Filled 2015-05-06 (×7): qty 1

## 2015-05-06 MED ORDER — POLYETHYLENE GLYCOL 3350 17 G PO PACK
17.0000 g | PACK | Freq: Every day | ORAL | Status: DC
Start: 2015-05-06 — End: 2015-05-13
  Administered 2015-05-07 – 2015-05-13 (×6): 17 g via ORAL
  Filled 2015-05-06 (×7): qty 1

## 2015-05-06 MED ORDER — SODIUM CHLORIDE 0.9 % IV BOLUS (SEPSIS)
500.0000 mL | Freq: Once | INTRAVENOUS | Status: AC
  Administered 2015-05-06: 500 mL via INTRAVENOUS

## 2015-05-06 MED ORDER — MORPHINE SULFATE 2 MG/ML IJ SOLN
2.0000 mg | INTRAMUSCULAR | Status: DC | PRN
  Administered 2015-05-06 – 2015-05-09 (×8): 2 mg via INTRAVENOUS
  Filled 2015-05-06 (×8): qty 1

## 2015-05-06 MED ORDER — LINACLOTIDE 145 MCG PO CAPS
145.0000 ug | ORAL_CAPSULE | Freq: Every day | ORAL | Status: DC
  Administered 2015-05-07 – 2015-05-13 (×7): 145 ug via ORAL
  Filled 2015-05-06 (×7): qty 1

## 2015-05-06 MED ORDER — SODIUM CHLORIDE 0.9 % IJ SOLN
3.0000 mL | Freq: Two times a day (BID) | INTRAMUSCULAR | Status: DC
  Administered 2015-05-07 (×2): 3 mL via INTRAVENOUS

## 2015-05-06 MED ORDER — IOHEXOL 300 MG/ML  SOLN
100.0000 mL | Freq: Once | INTRAMUSCULAR | Status: AC | PRN
  Administered 2015-05-06: 100 mL via INTRAVENOUS

## 2015-05-06 MED ORDER — MORPHINE SULFATE 2 MG/ML IJ SOLN
2.0000 mg | Freq: Once | INTRAMUSCULAR | Status: AC
  Administered 2015-05-06: 2 mg via INTRAVENOUS
  Filled 2015-05-06: qty 1

## 2015-05-06 MED ORDER — IOHEXOL 300 MG/ML  SOLN
50.0000 mL | Freq: Once | INTRAMUSCULAR | Status: AC | PRN
Start: 2015-05-06 — End: 2015-05-06
  Administered 2015-05-06: 50 mL via ORAL

## 2015-05-06 MED ORDER — SODIUM CHLORIDE 0.9 % IV SOLN
INTRAVENOUS | Status: DC
  Administered 2015-05-06: 17:00:00 via INTRAVENOUS

## 2015-05-06 MED ORDER — SENNA 8.6 MG PO TABS
1.0000 | ORAL_TABLET | Freq: Two times a day (BID) | ORAL | Status: DC
  Administered 2015-05-06 – 2015-05-13 (×14): 8.6 mg via ORAL
  Filled 2015-05-06 (×14): qty 1

## 2015-05-06 MED ORDER — MELATONIN 5 MG PO CAPS: 5.0000 mg | ORAL_CAPSULE | Freq: Every day | ORAL | Status: DC

## 2015-05-06 MED ORDER — POTASSIUM CHLORIDE 10 MEQ/100ML IV SOLN
10.0000 meq | Freq: Once | INTRAVENOUS | Status: AC
  Administered 2015-05-06: 10 meq via INTRAVENOUS
  Filled 2015-05-06: qty 100

## 2015-05-06 MED ORDER — ACETAMINOPHEN 325 MG PO TABS
650.0000 mg | ORAL_TABLET | Freq: Four times a day (QID) | ORAL | Status: DC | PRN
  Administered 2015-05-08: 650 mg via ORAL
  Filled 2015-05-06: qty 2

## 2015-05-06 MED ORDER — MORPHINE SULFATE 4 MG/ML IJ SOLN
4.0000 mg | Freq: Once | INTRAMUSCULAR | Status: AC
  Administered 2015-05-06: 4 mg via INTRAVENOUS
  Filled 2015-05-06: qty 1

## 2015-05-06 MED ORDER — ALLOPURINOL 100 MG PO TABS
100.0000 mg | ORAL_TABLET | Freq: Every day | ORAL | Status: DC
  Administered 2015-05-06 – 2015-05-13 (×8): 100 mg via ORAL
  Filled 2015-05-06 (×8): qty 1

## 2015-05-06 MED ORDER — ENOXAPARIN SODIUM 40 MG/0.4ML ~~LOC~~ SOLN
40.0000 mg | SUBCUTANEOUS | Status: DC
  Administered 2015-05-06 – 2015-05-12 (×7): 40 mg via SUBCUTANEOUS
  Filled 2015-05-06 (×7): qty 0.4

## 2015-05-06 MED ORDER — CLONAZEPAM 1 MG PO TABS
1.0000 mg | ORAL_TABLET | Freq: Every evening | ORAL | Status: DC | PRN
  Administered 2015-05-06 – 2015-05-12 (×6): 1 mg via ORAL
  Filled 2015-05-06 (×6): qty 1

## 2015-05-06 MED ORDER — POTASSIUM CHLORIDE IN NACL 20-0.9 MEQ/L-% IV SOLN
INTRAVENOUS | Status: DC
Start: 2015-05-06 — End: 2015-05-07
  Administered 2015-05-06: 20:00:00 via INTRAVENOUS
  Filled 2015-05-06 (×2): qty 1000

## 2015-05-06 MED ORDER — POTASSIUM CHLORIDE CRYS ER 20 MEQ PO TBCR
40.0000 meq | EXTENDED_RELEASE_TABLET | Freq: Once | ORAL | Status: AC
  Administered 2015-05-06: 40 meq via ORAL
  Filled 2015-05-06: qty 2

## 2015-05-06 MED ORDER — HYDROCODONE-ACETAMINOPHEN 5-325 MG PO TABS
1.0000 | ORAL_TABLET | Freq: Four times a day (QID) | ORAL | Status: DC | PRN
  Administered 2015-05-06 – 2015-05-10 (×7): 1 via ORAL
  Filled 2015-05-06 (×7): qty 1

## 2015-05-06 MED ORDER — ALBUTEROL SULFATE (2.5 MG/3ML) 0.083% IN NEBU: 2.5000 mg | INHALATION_SOLUTION | RESPIRATORY_TRACT | Status: DC | PRN

## 2015-05-06 MED ORDER — ACETAMINOPHEN 650 MG RE SUPP: 650.0000 mg | Freq: Four times a day (QID) | RECTAL | Status: DC | PRN

## 2015-05-06 MED ORDER — ATORVASTATIN CALCIUM 10 MG PO TABS
20.0000 mg | ORAL_TABLET | Freq: Every day | ORAL | Status: DC
  Administered 2015-05-07 – 2015-05-13 (×7): 20 mg via ORAL
  Filled 2015-05-06 (×7): qty 2

## 2015-05-06 MED ORDER — ONDANSETRON HCL 4 MG PO TABS: 4.0000 mg | ORAL_TABLET | Freq: Four times a day (QID) | ORAL | Status: DC | PRN

## 2015-05-06 MED ORDER — ALUM & MAG HYDROXIDE-SIMETH 200-200-20 MG/5ML PO SUSP: 30.0000 mL | Freq: Four times a day (QID) | ORAL | Status: DC | PRN

## 2015-05-06 NOTE — H&P (Signed)
History and Physical  Jillian Wheeler ZOX:[id number] DOB: December 26, 1924 DOA: 05/06/2015  Referring physician: Dr. Cathren Laine, EDP PCP: Hoyle Sauer, MD  Outpatient Specialists:  1. Orthopedics: Dr. Shelle Iron  Chief Complaint: Abdominal pain  HPI: Jillian Wheeler is a 81 y.o. female, widowed, resident of [facility], with history of diet-controlled DM 2, stage III CKD, B12 deficiency, essential hypertension, constipation, prior falls, possible mild dementia, GERD, hypothyroid, chronic diastolic CHF, hospitalization in March 2016 for multifactorial hyponatremia, presented to the [facility] ED on 05/06/15 with complaints of abdominal pain. History is provided by patient (not a good historian) and her daughter at bedside. Patient gives 6-8 months history of chronic intermittent abdominal pain, progressive weight loss of approximately 40 pounds, failure to thrive and progressive generalized weakness. She states that imaging studies were done about 6 months ago and she was told to have constipation. She had EGD couple of years ago-results not known. Patient has declined colonoscopy and has never had one. She was in her usual state of health until 5/18 when she started experiencing bilateral groin/lower abdominal pain, intermittent, sharp, rated at 9/10 at its worst, no associated nausea, vomiting or fevers or chills. She has chronic decreased appetite which has not changed. Denied urinary frequency and states that the dysuria started only in the ED. Today her PCP was contacted and patient was advised to come to the ED for further evaluation. In the ED, patient was hemodynamically stable, ultrasound and CT of abdomen did not show acute findings. Lab work significant for sodium of 118, potassium 3.1 and hemoglobin 11.4. Urine microscopy not impressive for UTI. Hospitalist admission requested.     Review of Systems: All systems reviewed and apart from history of presenting illness, are negative. As  per daughter, patient's mental status fluctuates but currently at baseline. Patient denies back pain. She complains of some chronic intermittent left hip pain.  Past Medical History  Diagnosis Date  . Palpitations   . Unspecified fall   . Altered mental status   . Other nonspecific finding on examination of urine   . Symptomatic menopausal or female climacteric states   . Insomnia, unspecified   . Contact dermatitis and other eczema, due to unspecified cause   . Type II or unspecified type diabetes mellitus with neurological manifestations, not stated as uncontrolled   . Dermatophytosis of nail   . Mastodynia   . Contact dermatitis and other eczema due to solvents   . Chronic kidney disease, stage III (moderate)   . Acute bronchitis   . Wheezing   . Pain in joint, pelvic region and thigh   . Other speech disturbance(784.59)   . Unspecified acute reaction to stress   . Other malaise and fatigue   . Spasm of muscle   . Blood in stool   . Other specified noninflammatory disorder of vagina   . Osteoarthrosis, unspecified whether generalized or localized, unspecified site   . Gout, unspecified   . Adjustment disorder with mixed anxiety and depressed mood   . Other vitamin B12 deficiency anemia   . Obstructive sleep apnea (adult) (pediatric)   . Other dyspnea and respiratory abnormality   . Type II or unspecified type diabetes mellitus without mention of complication, not stated as uncontrolled   . Unspecified asthma(493.90)   . Esophageal reflux   . Other and unspecified hyperlipidemia   . Thyrotoxicosis without mention of goiter or other cause, without mention of thyrotoxic crisis or storm   . Unspecified  essential hypertension    History reviewed. No pertinent past surgical history. Social History:  reports that she has never smoked. She does not have any smokeless tobacco history on file. She reports that she does not drink alcohol or use illicit drugs. Widowed. Lives at  [facility]. Mobile using wheelchair walker.  Allergies  Allergen Reactions  . Aspirin Other (See Comments)    Upset stomach  . Celecoxib Other (See Comments)    Upset stomach  . Diclofenac Sodium Other (See Comments)    Pt does not remember   . Ibuprofen Other (See Comments)    Upset stomach   . Naproxen Other (See Comments)    Upset stomach   . Sulindac Other (See Comments)    Pt does not remember     Family History  Problem Relation Age of Onset  . Cancer Sister   . Osteoarthritis Mother   . Heart disease    . Stroke Sister     Prior to Admission medications   Medication Sig Start Date End Date Taking? Authorizing Provider  acetaminophen (TYLENOL) 325 MG tablet Take 325 mg by mouth at bedtime. To be taken with Tramadol   Yes Historical Provider, MD  allopurinol (ZYLOPRIM) 100 MG tablet Take 100 mg by mouth daily.  02/19/14  Yes Historical Provider, MD  amLODipine (NORVASC) 5 MG tablet Take 1 tablet (5 mg total) by mouth daily. 03/09/15  Yes Catarina Hartshorn, MD  atorvastatin (LIPITOR) 20 MG tablet Take 20 mg by mouth daily.  11/25/13  Yes Historical Provider, MD  Calcium Carbonate-Vitamin D (CALTRATE 600+D) 600-400 MG-UNIT per tablet Take 1 tablet by mouth daily. At lunch and supper   Yes Historical Provider, MD  clonazePAM (KLONOPIN) 1 MG tablet 2 by mouth every morning , 1 by mouth every night at bedtime Patient taking differently: Take 1 mg by mouth at bedtime as needed (sleep). 2 by mouth every morning , 1 by mouth every night at bedtime 03/17/15  Yes Kimber Relic, MD  cyanocobalamin (,VITAMIN B-12,) 1000 MCG/ML injection Inject 1,000 mcg into the muscle every 30 (thirty) days.   Yes Historical Provider, MD  levothyroxine (SYNTHROID, LEVOTHROID) 88 MCG tablet Take 88 mcg by mouth daily before breakfast.   Yes Historical Provider, MD  Linaclotide (LINZESS) 145 MCG CAPS capsule Take 145 mcg by mouth daily.   Yes Historical Provider, MD  Melatonin 5 MG CAPS Take 5 mg by mouth at  bedtime.    Yes Historical Provider, MD  Multiple Vitamins-Minerals (CERTAVITE/ANTIOXIDANTS) TABS Take 1 tablet by mouth daily.   Yes Historical Provider, MD  Multiple Vitamins-Minerals (PRESERVISION AREDS 2) CAPS Take 1 capsule by mouth 2 (two) times daily. 12 noon and 8 pm   Yes Historical Provider, MD  Omega-3-acid Ethyl Esters (LOVAZA PO) Take 1,200 mg by mouth daily.   Yes Historical Provider, MD  polyethylene glycol (MIRALAX / GLYCOLAX) packet Take 17 g by mouth at bedtime as needed for mild constipation or moderate constipation. Take if no BM up to 3 times weekly.   Yes Historical Provider, MD  traMADol-acetaminophen (ULTRACET) 37.5-325 MG per tablet Take 1 tablet by mouth every 6 (six) hours as needed for moderate pain or severe pain. And 1 tablet by mouth nightly at 8pm   Yes Historical Provider, MD  triamcinolone cream (KENALOG) 0.1 % Apply 1 application topically 2 (two) times daily as needed (irritation).   Yes Historical Provider, MD   Physical Exam: Filed Vitals:   05/06/15 1600 05/06/15 1630 05/06/15  1644 05/06/15 1706  BP: 143/49 139/57  149/55  Pulse: 77 80  79  Temp:   98.6 F (37 C)   TempSrc:   Oral   Resp: 17 22  18   Height:    5\' 2"  (1.575 m)  Weight:    67.2 kg (148 lb 2.4 oz)  SpO2: 100% 100%  100%     General exam: Small built and thinly nourished pleasant elderly female patient, lying comfortably propped up in bed in no obvious distress.  Head, eyes and ENT: Nontraumatic and normocephalic. Pupils equally reacting to light and accommodation. Oral mucosa with borderline hydration.  Neck: Supple. No JVD, carotid bruit or thyromegaly.  Lymphatics: No lymphadenopathy.  Respiratory system: Clear to auscultation. No increased work of breathing.  Cardiovascular system: S1 and S2 heard, RRR. No JVD, murmurs, gallops, clicks or pedal edema.  Gastrointestinal system: Abdomen is nondistended, soft and nontender. Normal bowel sounds heard. No organomegaly or masses  appreciated. Bilateral inguinal hernial sites without acute findings. Mild fungal infection of right groin skin fold.  Central nervous system: Alert and oriented 4. No focal neurological deficits.  Extremities: Symmetric 5 x 5 power. Peripheral pulses symmetrically felt. Surgical scar over left knee.  Skin: No rashes or acute findings.  Musculoskeletal system: Negative exam. Left hip exam without acute findings.  Psychiatry: Pleasant and cooperative.   Labs on Admission:  Basic Metabolic Panel:  Recent Labs Lab 05/06/15 1230  NA 118*  K 3.1*  CL 81*  CO2 25  GLUCOSE 124*  BUN 13  CREATININE 0.93  CALCIUM 9.6   Liver Function Tests:  Recent Labs Lab 05/06/15 1230  AST 23  ALT 20  ALKPHOS 85  BILITOT 0.6  PROT 6.7  ALBUMIN 3.6   No results for input(s): LIPASE, AMYLASE in the last 168 hours. No results for input(s): AMMONIA in the last 168 hours. CBC:  Recent Labs Lab 05/06/15 1230  WBC 10.2  HGB 11.4*  HCT 31.6*  MCV 96.9  PLT 338   Cardiac Enzymes: No results for input(s): CKTOTAL, CKMB, CKMBINDEX, TROPONINI in the last 168 hours.  BNP (last 3 results) No results for input(s): PROBNP in the last 8760 hours. CBG: No results for input(s): GLUCAP in the last 168 hours.  Radiological Exams on Admission: US Abdomen Complete  05/06/2015   CLINICAL DATA:  Abdominal pain  EXAM: ULTRASOUND ABDOMEN COMPLETE  COMPARISON:  05/06/2015  FINDINGS: Gallbladder: Single 1.4 cm gallstone is identified. No significant wall thickening or pericholecystic fluid is noted. A negative sonographic Eulah Pont sign is noted.  Common bile duct: Diameter: 3 mm.  Liver: No focal lesion identified. Within normal limits in parenchymal echogenicity.  IVC: No abnormality visualized.  Pancreas: Not well visualized due to overlying bowel gas.  Spleen: Size and appearance within normal limits.  Right Kidney: Length: 10.4 cm. Echogenicity within normal limits. No mass or hydronephrosis  visualized.  Left Kidney: Length: 10.2 cm. Echogenicity within normal limits. No mass or hydronephrosis visualized.  Abdominal aorta: No aneurysm visualized.  Other findings: None.  IMPRESSION: Cholelithiasis without significant wall thickening or pericholecystic fluid.  No other focal abnormality is noted.   Electronically Signed   By: Alcide Clever M.D.   On: 05/06/2015 15:30   Ct Abdomen Pelvis W Contrast  05/06/2015   CLINICAL DATA:  Lower abdominal pain.  Nausea since yesterday.  EXAM: CT ABDOMEN AND PELVIS WITH CONTRAST  TECHNIQUE: Multidetector CT imaging of the abdomen and pelvis was performed using the standard protocol  following bolus administration of intravenous contrast.  CONTRAST:  50mL OMNIPAQUE IOHEXOL 300 MG/ML SOLN, 100mL OMNIPAQUE IOHEXOL 300 MG/ML SOLN  COMPARISON:  12/23/2014  FINDINGS: The lung bases are clear.  The liver demonstrates no focal abnormality. There is no intrahepatic or extrahepatic biliary ductal dilatation. Mild pericholecystic fluid and mild gallbladder wall thickening. The spleen demonstrates no focal abnormality. The pancreas is normal. There is a 16 mm hypodense, fluid attenuating left interpolar renal mass most consistent with a cyst. There is a 4 mm hypodense lesion in the right interpolar aspect of the kidney too small to characterize. There is no obstructive uropathy. The adrenal glands are normal. The bladder is unremarkable.  The stomach, duodenum, small intestine, and large intestine demonstrate no contrast extravasation or dilatation. There is a moderate size hiatal hernia. There is a small fat containing umbilical hernia. There is no pneumoperitoneum, pneumatosis, or portal venous gas. There is no abdominal or pelvic free fluid. There is no lymphadenopathy.  The abdominal aorta is normal in caliber with atherosclerosis.  There are no lytic or sclerotic osseous lesions. There is a chronic L5 vertebral body compression fracture status post augmentation. There is  methylmethacrylate within the L5 vertebral body with extrusion of methylmethacrylate into the spinal canal along the right paracentral aspect and extending into the right neural foramen with overall right lateral recess stenosis at L5-S1 and right foraminal stenosis. There is bilateral facet arthropathy throughout the lumbar spine. There is grade 1 anterolisthesis of L4 on L5. There is bilateral foraminal stenosis at L4-5.  IMPRESSION: 1. Mild gallbladder wall thickening and pericholecystic fluid as can be seen with acute cholecystitis. Correlate with clinical exam. If there is further clinical concern, recommend a right upper quadrant ultrasound. 2. There is a chronic L5 vertebral body compression fracture status post augmentation. There is methylmethacrylate within the L5 vertebral body with extrusion of methylmethacrylate into the spinal canal along the right paracentral aspect and extending into the right neural foramen with overall right lateral recess stenosis at L5-S1 and right foraminal stenosis.   Electronically Signed   By: Elige KoHetal  Patel   On: 05/06/2015 14:08    EKG: None seen in Epic for today and no indication to request one at this time  Assessment/Plan Principal Problem:   Lower abdominal pain - This is subacute/acute on chronic intermittent pain of unclear etiology - Clinical exam and imaging studies (CT and ultrasound abdomen) without acute findings - ? Functional related to constipation/? IBS - Other possibility could be radiating pain from lower back however patient denies low back pain (please consider discussing CT findings related to lower back with orthopedics in a.m.) - Patient states he once a dysuria in the ED but urine microscopy not impressive for UTI. Follow urine culture results and hold off on starting antibiotics unless she spikes a fever. - Chronic intermittent abdominal pain with profound weight loss is concerning but no mass lesion seen on CT abdomen which is  reassuring.? May not be a candidate for aggressive evaluation i.e. colonoscopy-decision will be deferred to outpatient follow-up with PCP - Currently pain controlled with medications - Started bowel regimen, continue PPI.  Active Problems:   Hyponatremia - Likely subacute on chronic - Hospitalization in March with sodium of 114 and discharge sodium 130. At that time hyponatremia felt to be multifactorial which may be the case during this admission too - Possibly from poor oral intake, volume depletion and SIADH - Gentle hydration with IV normal saline and aim to increase sodium  by approximately 8 mEq per 24 hours - Asymptomatic of new mental status changes - Check urine and serum osmolarity - TSH recently was normal    Hypokalemia - Replace and follow BMP    Hyperlipidemia - Continue statins    DIASTOLIC HEART FAILURE, CHRONIC - Clinically volume depleted - Not on diuretics at home    GERD - Continue PPI    Anemia - Chronic and stable - Follow CBCs   Diet-controlled DM 2 - Monitor CBGs   Cholelithiasis without cholecystitis - Seen on imaging  L5 compression fracture status post augmentation - Please see discussion above  DO NOT RESUSCITATE - Confirmed with patient's daughter/healthcare power of attorney  Chronic constipation - Continue Linzess and add MiraLAX and senna  History of falls - Last fall was approximately 3-4 weeks ago    Code Status: DO NOT RESUSCITATE  Family Communication: Discussed with patient's daughter Ms Oswald HillockSarah-Juanita /healthcare power for attorney at bedside  Disposition Plan: DC to ALF when medically stable, possibly in 2-3 days.   Time spent: 60 minutes  HONGALGI,ANAND, MD, FACP, FHM. [facility] Pager [phone]  If 7PM-7AM, please contact night-coverage [email or web address] Password [facility] [email or web address] 05/06/2015, 5:42 PM

## 2015-05-06 NOTE — ED Notes (Signed)
MD at bedside. 

## 2015-05-06 NOTE — ED Notes (Signed)
Patient transported to X-ray 

## 2015-05-06 NOTE — ED Notes (Signed)
Ultrasound Bedside.

## 2015-05-06 NOTE — Progress Notes (Signed)
CSW attempted to meet with pt at bedside. However, she was not present. There was no family present.  Nurse tech informed CSW that the pt has been transported to x-ray.  Trish MageBrittney Mikiah Durall, LCSWA [phone] ED CSW 05/06/2015 2:02 PM

## 2015-05-06 NOTE — ED Notes (Signed)
MD at bedside. STEINL 

## 2015-05-06 NOTE — ED Notes (Signed)
Patient transported to CT 

## 2015-05-06 NOTE — ED Notes (Signed)
Bed: WA04 Expected date:  Expected time:  Means of arrival:  Comments: EMS-abdominal pain 

## 2015-05-06 NOTE — ED Notes (Signed)
DRINKING ORAL CONTRAST 

## 2015-05-06 NOTE — ED Provider Notes (Addendum)
CSN: [id number]     Arrival date & time 05/06/15  1215 History   First MD Initiated Contact with Patient 05/06/15 1231     Chief Complaint  Patient presents with  . Abdominal Pain  . Nausea     (Consider location/radiation/quality/duration/timing/severity/associated sxs/prior Treatment) Patient is a 81 y.o. female presenting with abdominal pain. The history is provided by the patient.  Abdominal Pain Associated symptoms: no chest pain, no chills, no cough, no diarrhea, no dysuria, no fever, no shortness of breath, no sore throat and no vomiting   Patient c/o severe lower abdominal pain in the past day. Pain constant, dull, non radiating. No specific exacerbating or alleviating factors. Nausea. No vomiting. Had normal bm today and yesterday. No dysuria or gu c/o. Has chronic back pain, no recent change. No fever or chills. Prior abd surgery includes remote hx appendectomy and hysterectomy. Denies abdominal distension. Sl decreased appetite. No cough or uri c/o. No chest pain or discomfort.       Past Medical History  Diagnosis Date  . Palpitations   . Unspecified fall   . Altered mental status   . Other nonspecific finding on examination of urine   . Symptomatic menopausal or female climacteric states   . Insomnia, unspecified   . Contact dermatitis and other eczema, due to unspecified cause   . Type II or unspecified type diabetes mellitus with neurological manifestations, not stated as uncontrolled   . Dermatophytosis of nail   . Mastodynia   . Contact dermatitis and other eczema due to solvents   . Chronic kidney disease, stage III (moderate)   . Acute bronchitis   . Wheezing   . Pain in joint, pelvic region and thigh   . Other speech disturbance(784.59)   . Unspecified acute reaction to stress   . Other malaise and fatigue   . Spasm of muscle   . Blood in stool   . Other specified noninflammatory disorder of vagina   . Osteoarthrosis, unspecified whether generalized or  localized, unspecified site   . Gout, unspecified   . Adjustment disorder with mixed anxiety and depressed mood   . Other vitamin B12 deficiency anemia   . Obstructive sleep apnea (adult) (pediatric)   . Other dyspnea and respiratory abnormality   . Type II or unspecified type diabetes mellitus without mention of complication, not stated as uncontrolled   . Unspecified asthma(493.90)   . Esophageal reflux   . Other and unspecified hyperlipidemia   . Thyrotoxicosis without mention of goiter or other cause, without mention of thyrotoxic crisis or storm   . Unspecified essential hypertension    History reviewed. No pertinent past surgical history. Family History  Problem Relation Age of Onset  . Cancer Sister   . Osteoarthritis Mother   . Heart disease    . Stroke Sister    History  Substance Use Topics  . Smoking status: Never Smoker   . Smokeless tobacco: Not on file  . Alcohol Use: No   OB History    No data available     Review of Systems  Constitutional: Negative for fever and chills.  HENT: Negative for sore throat.   Eyes: Negative for redness.  Respiratory: Negative for cough and shortness of breath.   Cardiovascular: Negative for chest pain.  Gastrointestinal: Positive for abdominal pain. Negative for vomiting and diarrhea.  Endocrine: Negative for polyuria.  Genitourinary: Negative for dysuria and flank pain.  Musculoskeletal: Negative for back pain and neck pain.  Skin:  Negative for rash.  Neurological: Negative for headaches.  Hematological: Does not bruise/bleed easily.  Psychiatric/Behavioral: Negative for confusion.      Allergies  Aspirin; Celecoxib; Diclofenac sodium; Ibuprofen; Naproxen; and Sulindac  Home Medications   Prior to Admission medications   Medication Sig Start Date End Date Taking? Authorizing Provider  amLODipine (NORVASC) 5 MG tablet Take 1 tablet (5 mg total) by mouth daily. 03/09/15  Yes Catarina Hartshorn, MD  Calcium Carbonate-Vitamin D  (CALTRATE 600+D) 600-400 MG-UNIT per tablet Take 1 tablet by mouth daily. At lunch and supper   Yes Historical Provider, MD  cyanocobalamin (,VITAMIN B-12,) 1000 MCG/ML injection Inject 1,000 mcg into the muscle every 30 (thirty) days.   Yes Historical Provider, MD  levothyroxine (SYNTHROID, LEVOTHROID) 88 MCG tablet Take 88 mcg by mouth daily before breakfast.   Yes Historical Provider, MD  Linaclotide (LINZESS) 145 MCG CAPS capsule Take 145 mcg by mouth daily.   Yes Historical Provider, MD  Multiple Vitamins-Minerals (CERTAVITE/ANTIOXIDANTS) TABS Take 1 tablet by mouth daily.   Yes Historical Provider, MD  Multiple Vitamins-Minerals (PRESERVISION AREDS 2) CAPS Take 1 capsule by mouth daily.    Yes Historical Provider, MD  acetaminophen (TYLENOL) 325 MG tablet Take 325 mg by mouth at bedtime. To be taken with Tramadol    Historical Provider, MD  allopurinol (ZYLOPRIM) 100 MG tablet Take 100 mg by mouth daily.  02/19/14   Historical Provider, MD  amoxicillin (AMOXIL) 500 MG capsule Take 1 capsule (500 mg total) by mouth every 8 (eight) hours. Patient not taking: Reported on 04/15/2015 03/08/15   Catarina Hartshorn, MD  atorvastatin (LIPITOR) 20 MG tablet Take 20 mg by mouth daily.  11/25/13   Historical Provider, MD  ciprofloxacin (CIPRO) 500 MG tablet Take 1 tablet (500 mg total) by mouth 2 (two) times daily. 03/05/15   Zannie Cove, MD  clobetasol ointment (TEMOVATE) 0.05 % Apply 1 application topically 2 (two) times daily as needed (rash).     Historical Provider, MD  clonazePAM (KLONOPIN) 1 MG tablet 2 by mouth every morning , 1 by mouth every night at bedtime 03/17/15   Kimber Relic, MD  clonazePAM (KLONOPIN) 2 MG tablet Take 2 mg by mouth at bedtime.  10/20/13   Historical Provider, MD  docusate sodium (COLACE) 100 MG capsule Take 1 capsule (100 mg total) by mouth 2 (two) times daily. 03/08/15   Catarina Hartshorn, MD  esomeprazole (NEXIUM) 40 MG capsule Take 40 mg by mouth daily at 12 noon.    Historical Provider,  MD  HYDROcodone-acetaminophen (NORCO/VICODIN) 5-325 MG per tablet Take 1-1.5 tablets by mouth See admin instructions. Takes 1/2 tab at bedtime, may take an additional 1 tab in the night if needed. Also may take 1 tab during the day if needed.    Historical Provider, MD  Melatonin 5 MG CAPS Take 1 capsule by mouth at bedtime.    Historical Provider, MD  mirtazapine (REMERON) 15 MG tablet Take 15 mg by mouth at bedtime.  03/09/14   Historical Provider, MD  Multiple Vitamin (MULTIVITAMIN) tablet Take 1 tablet by mouth at bedtime.     Historical Provider, MD  Omega-3-acid Ethyl Esters (LOVAZA PO) Take 1,200 mg by mouth daily.    Historical Provider, MD  pantoprazole (PROTONIX) 40 MG tablet Take 40 mg by mouth every morning.    Historical Provider, MD  senna (SENOKOT) 8.6 MG TABS tablet Take 1 tablet (8.6 mg total) by mouth daily. 03/08/15   Catarina Hartshorn, MD  traMADol (  ULTRAM) 50 MG tablet 2 by mouth three times daily for back pain NOTE DOSE 03/17/15   Kimber Relic, MD   BP 150/73 mmHg  Pulse 71  Temp(Src) 97.7 F (36.5 C) (Oral)  Resp 21  Ht  (1.6 m)  Wt 147 lb (66.679 kg)  BMI 26.05 kg/m2  SpO2 100% Physical Exam  Constitutional: She appears well-developed and well-nourished. No distress.  HENT:  Mouth/Throat: Oropharynx is clear and moist.  Eyes: Conjunctivae are normal. No scleral icterus.  Neck: Neck supple. No tracheal deviation present.  Cardiovascular: Normal rate, regular rhythm, normal heart sounds and intact distal pulses.   Pulmonary/Chest: Effort normal and breath sounds normal. No respiratory distress.  Abdominal: Soft. Normal appearance and bowel sounds are normal. She exhibits no distension and no mass. There is tenderness. There is no rebound and no guarding.  Moderate lower abd tenderness. No puls mass. Pt w soft, non tender, reducible umbilical hernia.   Genitourinary:  No cva tenderness. Small amount soft brown stool, grossly neg. No impaction.   Musculoskeletal: She  exhibits no edema.  Neurological: She is alert.  Skin: Skin is warm and dry. No rash noted. She is not diaphoretic.  No shingles/rash in area of pain.   Psychiatric: She has a normal mood and affect.  Nursing note and vitals reviewed.   ED Course  Procedures (including critical care time) Labs Review   Results for orders placed or performed during the hospital encounter of 05/06/15  CBC  Result Value Ref Range   WBC 10.2 4.0 - 10.5 K/uL   RBC 3.26 (L) 3.87 - 5.11 MIL/uL   Hemoglobin 11.4 (L) 12.0 - 15.0 g/dL   HCT 16.1 (L) 09.6 - 04.5 %   MCV 96.9 78.0 - 100.0 fL   MCH 35.0 (H) 26.0 - 34.0 pg   MCHC 36.1 (H) 30.0 - 36.0 g/dL   RDW 40.9 81.1 - 91.4 %   Platelets 338 150 - 400 K/uL  Comprehensive metabolic panel  Result Value Ref Range   Sodium 118 (LL) 135 - 145 mmol/L   Potassium 3.1 (L) 3.5 - 5.1 mmol/L   Chloride 81 (L) 101 - 111 mmol/L   CO2 25 22 - 32 mmol/L   Glucose, Bld 124 (H) 65 - 99 mg/dL   BUN 13 6 - 20 mg/dL   Creatinine, Ser 7.82 0.44 - 1.00 mg/dL   Calcium 9.6 8.9 - 95.6 mg/dL   Total Protein 6.7 6.5 - 8.1 g/dL   Albumin 3.6 3.5 - 5.0 g/dL   AST 23 15 - 41 U/L   ALT 20 14 - 54 U/L   Alkaline Phosphatase 85 38 - 126 U/L   Total Bilirubin 0.6 0.3 - 1.2 mg/dL   GFR calc non Af Amer 54 (L) >60 mL/min   GFR calc Af Amer >60 >60 mL/min   Anion gap 12 5 - 15  Urinalysis, Routine w reflex microscopic  Result Value Ref Range   Color, Urine YELLOW YELLOW   APPearance CLOUDY (A) CLEAR   Specific Gravity, Urine 1.020 1.005 - 1.030   pH 7.0 5.0 - 8.0   Glucose, UA NEGATIVE NEGATIVE mg/dL   Hgb urine dipstick SMALL (A) NEGATIVE   Bilirubin Urine NEGATIVE NEGATIVE   Ketones, ur 15 (A) NEGATIVE mg/dL   Protein, ur 213 (A) NEGATIVE mg/dL   Urobilinogen, UA 0.2 0.0 - 1.0 mg/dL   Nitrite NEGATIVE NEGATIVE   Leukocytes, UA SMALL (A) NEGATIVE  Urine microscopic-add on  Result  Value Ref Range   Squamous Epithelial / LPF RARE RARE   WBC, UA 3-6 <3 WBC/hpf   RBC /  HPF 3-6 <3 RBC/hpf   Bacteria, UA RARE RARE   [place] Abdomen Complete  05/06/2015   CLINICAL DATA:  Abdominal pain  EXAM: ULTRASOUND ABDOMEN COMPLETE  COMPARISON:  05/06/2015  FINDINGS: Gallbladder: Single 1.4 cm gallstone is identified. No significant wall thickening or pericholecystic fluid is noted. A negative sonographic Eulah PontMurphy sign is noted.  Common bile duct: Diameter: 3 mm.  Liver: No focal lesion identified. Within normal limits in parenchymal echogenicity.  IVC: No abnormality visualized.  Pancreas: Not well visualized due to overlying bowel gas.  Spleen: Size and appearance within normal limits.  Right Kidney: Length: 10.4 cm. Echogenicity within normal limits. No mass or hydronephrosis visualized.  Left Kidney: Length: 10.2 cm. Echogenicity within normal limits. No mass or hydronephrosis visualized.  Abdominal aorta: No aneurysm visualized.  Other findings: None.  IMPRESSION: Cholelithiasis without significant wall thickening or pericholecystic fluid.  No other focal abnormality is noted.   Electronically Signed   By: Alcide CleverMark  Lukens M.D.   On: 05/06/2015 15:30   Ct Abdomen Pelvis W Contrast  05/06/2015   CLINICAL DATA:  Lower abdominal pain.  Nausea since yesterday.  EXAM: CT ABDOMEN AND PELVIS WITH CONTRAST  TECHNIQUE: Multidetector CT imaging of the abdomen and pelvis was performed using the standard protocol following bolus administration of intravenous contrast.  CONTRAST:  50mL OMNIPAQUE IOHEXOL 300 MG/ML SOLN, 100mL OMNIPAQUE IOHEXOL 300 MG/ML SOLN  COMPARISON:  12/23/2014  FINDINGS: The lung bases are clear.  The liver demonstrates no focal abnormality. There is no intrahepatic or extrahepatic biliary ductal dilatation. Mild pericholecystic fluid and mild gallbladder wall thickening. The spleen demonstrates no focal abnormality. The pancreas is normal. There is a 16 mm hypodense, fluid attenuating left interpolar renal mass most consistent with a cyst. There is a 4 mm hypodense lesion in the right  interpolar aspect of the kidney too small to characterize. There is no obstructive uropathy. The adrenal glands are normal. The bladder is unremarkable.  The stomach, duodenum, small intestine, and large intestine demonstrate no contrast extravasation or dilatation. There is a moderate size hiatal hernia. There is a small fat containing umbilical hernia. There is no pneumoperitoneum, pneumatosis, or portal venous gas. There is no abdominal or pelvic free fluid. There is no lymphadenopathy.  The abdominal aorta is normal in caliber with atherosclerosis.  There are no lytic or sclerotic osseous lesions. There is a chronic L5 vertebral body compression fracture status post augmentation. There is methylmethacrylate within the L5 vertebral body with extrusion of methylmethacrylate into the spinal canal along the right paracentral aspect and extending into the right neural foramen with overall right lateral recess stenosis at L5-S1 and right foraminal stenosis. There is bilateral facet arthropathy throughout the lumbar spine. There is grade 1 anterolisthesis of L4 on L5. There is bilateral foraminal stenosis at L4-5.  IMPRESSION: 1. Mild gallbladder wall thickening and pericholecystic fluid as can be seen with acute cholecystitis. Correlate with clinical exam. If there is further clinical concern, recommend a right upper quadrant ultrasound. 2. There is a chronic L5 vertebral body compression fracture status post augmentation. There is methylmethacrylate within the L5 vertebral body with extrusion of methylmethacrylate into the spinal canal along the right paracentral aspect and extending into the right neural foramen with overall right lateral recess stenosis at L5-S1 and right foraminal stenosis.   Electronically Signed   By: Alan RipperHetal  Patel   On: 05/06/2015 14:08       MDM   Iv ns. Labs.  Morphine iv.  zofran iv.  Reviewed nursing notes and prior charts for additional history.   Ct noted. Will get u/s.     U/s w gallstones but no cholecystitis, and pts pain is lower abd bilateral.  Afeb. Clinically to not suspect biliary infection.   ua w few wbc, will culture.    As Na low, will admit. Serial exams. Recheck na.      Cathren LaineKevin Lavonta Tillis, MD 05/06/15 [id number]  Cathren LaineKevin Shiryl Ruddy, MD 05/09/15 [phone]

## 2015-05-06 NOTE — ED Notes (Signed)
Pt is aware we need a urine sample. Pt thinks she will be able to use a bedpan, however, she doesn't have to go at this time. Pt states she will notify us when she is able to void

## 2015-05-06 NOTE — ED Notes (Signed)
Na = 118  Alerted by Luna GlasgowBrianna Mendoza  Informed primary nurse and MD

## 2015-05-06 NOTE — ED Notes (Signed)
Per [facility] DNR Yellow copy present. Pt c/o of abdominal pain lower only with nausea and no diarrhea x 1 day. Denies urinary symptoms. Back pain unknown however HX of the same.

## 2015-05-07 ENCOUNTER — Encounter (HOSPITAL_COMMUNITY): Payer: Self-pay | Admitting: Internal Medicine

## 2015-05-07 DIAGNOSIS — Z22322 Carrier or suspected carrier of Methicillin resistant Staphylococcus aureus: Secondary | ICD-10-CM

## 2015-05-07 DIAGNOSIS — E785 Hyperlipidemia, unspecified: Secondary | ICD-10-CM

## 2015-05-07 HISTORY — DX: Carrier or suspected carrier of methicillin resistant Staphylococcus aureus: Z22.322

## 2015-05-07 LAB — CBC
HCT: 29.6 % — ABNORMAL LOW (ref 36.0–46.0)
Hemoglobin: 10.6 g/dL — ABNORMAL LOW (ref 12.0–15.0)
MCH: 35.5 pg — AB (ref 26.0–34.0)
MCHC: 35.8 g/dL (ref 30.0–36.0)
MCV: 99 fL (ref 78.0–100.0)
PLATELETS: 290 10*3/uL (ref 150–400)
RBC: 2.99 MIL/uL — ABNORMAL LOW (ref 3.87–5.11)
RDW: 13.2 % (ref 11.5–15.5)
WBC: 11.5 10*3/uL — ABNORMAL HIGH (ref 4.0–10.5)

## 2015-05-07 LAB — BASIC METABOLIC PANEL
Anion gap: 8 (ref 5–15)
BUN: 12 mg/dL (ref 6–20)
CO2: 24 mmol/L (ref 22–32)
CREATININE: 0.79 mg/dL (ref 0.44–1.00)
Calcium: 8.9 mg/dL (ref 8.9–10.3)
Chloride: 88 mmol/L — ABNORMAL LOW (ref 101–111)
GFR calc non Af Amer: >60 mL/min (ref >60)
GLUCOSE: 91 mg/dL (ref 65–99)
POTASSIUM: 4.3 mmol/L (ref 3.5–5.1)
Sodium: 120 mmol/L — ABNORMAL LOW (ref 135–145)

## 2015-05-07 LAB — OSMOLALITY: Osmolality: 246 mOsm/kg — ABNORMAL LOW (ref 275–300)

## 2015-05-07 LAB — GLUCOSE, CAPILLARY
GLUCOSE-CAPILLARY: 111 mg/dL — AB (ref 65–99)
GLUCOSE-CAPILLARY: 118 mg/dL — AB (ref 65–99)
Glucose-Capillary: 101 mg/dL — ABNORMAL HIGH (ref 65–99)
Glucose-Capillary: 105 mg/dL — ABNORMAL HIGH (ref 65–99)

## 2015-05-07 LAB — OSMOLALITY, URINE: OSMOLALITY UR: 388 mosm/kg — AB (ref 390–1090)

## 2015-05-07 LAB — MRSA PCR SCREENING: MRSA BY PCR: POSITIVE — AB

## 2015-05-07 LAB — SODIUM, URINE, RANDOM: Sodium, Ur: 71 mmol/L

## 2015-05-07 MED ORDER — SODIUM CHLORIDE 0.9 % IV SOLN
INTRAVENOUS | Status: DC
  Administered 2015-05-07 – 2015-05-12 (×6): via INTRAVENOUS

## 2015-05-07 MED ORDER — MUPIROCIN 2 % EX OINT
1.0000 "application " | TOPICAL_OINTMENT | Freq: Two times a day (BID) | CUTANEOUS | Status: AC
  Administered 2015-05-07 – 2015-05-11 (×10): 1 via NASAL
  Filled 2015-05-07: qty 22

## 2015-05-07 MED ORDER — CHLORHEXIDINE GLUCONATE CLOTH 2 % EX PADS
6.0000 | MEDICATED_PAD | Freq: Every day | CUTANEOUS | Status: DC
  Administered 2015-05-08 – 2015-05-11 (×4): 6 via TOPICAL

## 2015-05-07 NOTE — Progress Notes (Addendum)
Progress Note   Jillian Wheeler:[id number] DOB: Jul 10, 1925 DOA: 05/06/2015 PCP: Hoyle Sauer, MD   Brief Narrative:   Jillian Wheeler is an 81 y.o. female resident of [facility], with history of diet-controlled DM 2, stage III CKD, B12 deficiency, essential hypertension, constipation, prior falls, possible mild dementia, GERD, hypothyroid, chronic diastolic CHF, hospitalization in March 2016 for multifactorial hyponatremia, who was admitted 05/06/15 with complaints of abdominal pain. In the ED, patient was hemodynamically stable, ultrasound and CT of abdomen did not show acute findings. Lab work significant for sodium of 118, potassium 3.1 and hemoglobin 11.4. Urine microscopy not impressive for UTI.   Assessment/Plan:   Principal Problem:  Lower abdominal pain - This is subacute/acute on chronic intermittent pain of unclear etiology. - Clinical exam and imaging studies (CT and ultrasound abdomen) without acute findings. - ? Functional related to constipation/? IBS. - Patient states she had dysuria in the ED but urine microscopy not impressive for UTI. F/U urine culture results, not on antibiotics. - Chronic intermittent abdominal pain with profound weight loss is concerning but no mass lesion seen on CT abdomen. - Currently pain controlled with medications. - Started bowel regimen, continue PPI.  Active Problems:  Hyponatremia - Likely subacute on chronic.  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   - Hospitalization in March with sodium of 114 and discharge sodium 130.  - At that time hyponatremia felt to be multifactorial which may be the case during this admission too. - Possibly from poor oral intake, volume depletion and SIADH. - Continue gentle hydration with IV normal saline and aim to increase sodium by approximately 8 mEq per 24 hours. - Asymptomatic of new mental status changes. - Urine and serum osmolarity checked: urine osmolality/serum osmolality both low. - TSH  recently was normal.   MRSA carrier - Contact precautions/decontamination therapy ordered.    Hypokalemia - Replaced.     Hyperlipidemia - Continue statin.   DIASTOLIC HEART FAILURE, CHRONIC - Clinically volume depleted. - Not on diuretics at home.   GERD - Continue PPI.   Anemia - Chronic and stable. - Follow CBCs.  Diet-controlled DM 2 - Monitor CBGs: 101-192.  Cholelithiasis without cholecystitis - Seen on imaging.  L5 compression fracture status post augmentation - Please see discussion above.  Chronic constipation - Continue Linzess and add MiraLAX and senna.  History of falls - Last fall was approximately 3-4 weeks ago.   Code Status: DNR  Family Communication: Discussed with patient's daughter Ms Oswald Hillock /healthcare power for attorney at bedside.  Disposition Plan: D/C to [facility] when medically stable, possibly in 2-3 days.    IV Access:    Peripheral IV   Procedures and diagnostic studies:   US Abdomen Complete  05/06/2015   CLINICAL DATA:  Abdominal pain  EXAM: ULTRASOUND ABDOMEN COMPLETE  COMPARISON:  05/06/2015  FINDINGS: Gallbladder: Single 1.4 cm gallstone is identified. No significant wall thickening or pericholecystic fluid is noted. A negative sonographic Eulah Pont sign is noted.  Common bile duct: Diameter: 3 mm.  Liver: No focal lesion identified. Within normal limits in parenchymal echogenicity.  IVC: No abnormality visualized.  Pancreas: Not well visualized due to overlying bowel gas.  Spleen: Size and appearance within normal limits.  Right Kidney: Length: 10.4 cm. Echogenicity within normal limits. No mass or hydronephrosis visualized.  Left Kidney: Length: 10.2 cm. Echogenicity within normal limits. No mass or hydronephrosis visualized.  Abdominal aorta: No aneurysm visualized.  Other findings: None.  IMPRESSION: Cholelithiasis without  significant wall thickening or pericholecystic fluid.  No other focal abnormality is  noted.   Electronically Signed   By: Alcide CleverMark  Lukens M.D.   On: 05/06/2015 15:30   Ct Abdomen Pelvis W Contrast  05/06/2015   CLINICAL DATA:  Lower abdominal pain.  Nausea since yesterday.  EXAM: CT ABDOMEN AND PELVIS WITH CONTRAST  TECHNIQUE: Multidetector CT imaging of the abdomen and pelvis was performed using the standard protocol following bolus administration of intravenous contrast.  CONTRAST:  50mL OMNIPAQUE IOHEXOL 300 MG/ML SOLN, 100mL OMNIPAQUE IOHEXOL 300 MG/ML SOLN  COMPARISON:  12/23/2014  FINDINGS: The lung bases are clear.  The liver demonstrates no focal abnormality. There is no intrahepatic or extrahepatic biliary ductal dilatation. Mild pericholecystic fluid and mild gallbladder wall thickening. The spleen demonstrates no focal abnormality. The pancreas is normal. There is a 16 mm hypodense, fluid attenuating left interpolar renal mass most consistent with a cyst. There is a 4 mm hypodense lesion in the right interpolar aspect of the kidney too small to characterize. There is no obstructive uropathy. The adrenal glands are normal. The bladder is unremarkable.  The stomach, duodenum, small intestine, and large intestine demonstrate no contrast extravasation or dilatation. There is a moderate size hiatal hernia. There is a small fat containing umbilical hernia. There is no pneumoperitoneum, pneumatosis, or portal venous gas. There is no abdominal or pelvic free fluid. There is no lymphadenopathy.  The abdominal aorta is normal in caliber with atherosclerosis.  There are no lytic or sclerotic osseous lesions. There is a chronic L5 vertebral body compression fracture status post augmentation. There is methylmethacrylate within the L5 vertebral body with extrusion of methylmethacrylate into the spinal canal along the right paracentral aspect and extending into the right neural foramen with overall right lateral recess stenosis at L5-S1 and right foraminal stenosis. There is bilateral facet arthropathy  throughout the lumbar spine. There is grade 1 anterolisthesis of L4 on L5. There is bilateral foraminal stenosis at L4-5.  IMPRESSION: 1. Mild gallbladder wall thickening and pericholecystic fluid as can be seen with acute cholecystitis. Correlate with clinical exam. If there is further clinical concern, recommend a right upper quadrant ultrasound. 2. There is a chronic L5 vertebral body compression fracture status post augmentation. There is methylmethacrylate within the L5 vertebral body with extrusion of methylmethacrylate into the spinal canal along the right paracentral aspect and extending into the right neural foramen with overall right lateral recess stenosis at L5-S1 and right foraminal stenosis.   Electronically Signed   By: Elige KoHetal  Patel   On: 05/06/2015 14:08     Medical Consultants:    None.  Anti-Infectives:    None.  Subjective:   Nilda Riggstta C Dearden says she is still having some abdominal pain, but that it is a bit better.  Says her bowels moved yesterday, and was normal.  Reports nausea but no vomiting.  Reports that she has no appetite, and has to force herself to eat.  Objective:    Filed Vitals:   05/06/15 1644 05/06/15 1706 05/06/15 2128 05/07/15 0520  BP:  149/55 131/46 139/51  Pulse:  79 72 81  Temp: 98.6 F (37 C)  97.5 F (36.4 C) 97.9 F (36.6 C)  TempSrc: Oral  Oral Oral  Resp:  18 18   Height:  5\' 2"  (1.575 m)    Weight:  67.2 kg (148 lb 2.4 oz)    SpO2:  100% 100% 100%    Intake/Output Summary (Last 24 hours) at 05/07/15 [id number]  Last data filed at 05/07/15 0900  Gross per 24 hour  Intake 2612.5 ml  Output    575 ml  Net 2037.5 ml    Exam: Gen:  NAD Cardiovascular:  RRR, No M/R/G Respiratory:  Lungs CTAB Gastrointestinal:  Abdomen soft, NT/ND, + BS Extremities:  No C/E/C   Data Reviewed:    Labs: Basic Metabolic Panel:  Recent Labs Lab 05/06/15 1230 05/07/15 0511  NA 118* 120*  K 3.1* 4.3  CL 81* 88*  CO2 25 24  GLUCOSE 124* 91  BUN  13 12  CREATININE 0.93 0.79  CALCIUM 9.6 8.9   GFR Estimated Creatinine Clearance: 44.5 mL/min (by C-G formula based on Cr of 0.79). Liver Function Tests:  Recent Labs Lab 05/06/15 1230  AST 23  ALT 20  ALKPHOS 85  BILITOT 0.6  PROT 6.7  ALBUMIN 3.6   CBC:  Recent Labs Lab 05/06/15 1230 05/07/15 0511  WBC 10.2 11.5*  HGB 11.4* 10.6*  HCT 31.6* 29.6*  MCV 96.9 99.0  PLT 338 290   CBG:  Recent Labs Lab 05/06/15 2121 05/07/15 0759  GLUCAP 192* 101*   Sepsis Labs:  Recent Labs Lab 05/06/15 1230 05/07/15 0511  WBC 10.2 11.5*   Microbiology Recent Results (from the past 240 hour(s))  MRSA PCR Screening     Status: Abnormal   Collection Time: 05/07/15 12:30 AM  Result Value Ref Range Status   MRSA by PCR POSITIVE (A) NEGATIVE Final    Comment:        The GeneXpert MRSA Assay (FDA approved for NASAL specimens only), is one component of a comprehensive MRSA colonization surveillance program. It is not intended to diagnose MRSA infection nor to guide or monitor treatment for MRSA infections. RESULT CALLED TO, READ BACK BY AND VERIFIED WITH: L REINERT @ 0428 ON 05/07/15 BY C DAVIS      Medications:   . allopurinol  100 mg Oral Daily  . amLODipine  5 mg Oral Daily  . atorvastatin  20 mg Oral Daily  . enoxaparin (LOVENOX) injection  40 mg Subcutaneous Q24H  . levothyroxine  88 mcg Oral QAC breakfast  . Linaclotide  145 mcg Oral Daily  . polyethylene glycol  17 g Oral Daily  . senna  1 tablet Oral BID  . sodium chloride  3 mL Intravenous Q12H   Continuous Infusions:   Time spent: 35 minutes with > 50% of time discussing current diagnostic test results, clinical impression and plan of care, including extensive discussion with the daughter.   LOS: 1 day   Kameria Canizares  [facility] Pager [phone]. If unable to reach me by pager, please call my cell phone at [phone].  *Please refer to [email or web address], password [email or web address] to get updated schedule on  who will round on this patient, as hospitalists switch teams weekly. If 7PM-7AM, please contact night-coverage at [email or web address], password [email or web address] for any overnight needs.  05/07/2015, 9:29 AM

## 2015-05-07 NOTE — Care Management Note (Signed)
Case Management Note  Patient Details  Name: Nilda Riggstta C Sulkowski MRN: [id number] Date of Birth: March 23, 1925  Subjective/Objective:                    Action/Plan:   Expected Discharge Date:   (unknown)               Expected Discharge Plan:     In-House Referral:     Discharge planning Services     Post Acute Care Choice:    Choice offered to:     DME Arranged:    DME Agency:     HH Arranged:    HH Agency:     Status of Service:     Medicare Important Message Given:    Date Medicare IM Given:    Medicare IM give by:    Date Additional Medicare IM Given:    Additional Medicare Important Message give by:     If discussed at Long Length of Stay Meetings, dates discussed:    Additional Comments: 05/07/15 Dr. Jacky KindleAronson approved in pt status with MD.   Geni BersMcGibboney, Allene Furuya, RN 05/07/2015, 3:43 PM

## 2015-05-07 NOTE — Evaluation (Signed)
Occupational Therapy Evaluation Patient Details Name: Jillian Wheeler MRN: [id number] DOB: 05/23/1925 Today's Date: 05/07/2015    History of Present Illness 81 yo female admitted with lower abdominal pain. Hx of DM, essential HTn, back pain, mild dementia. Pt is from an ALF   Clinical Impression   Pt admitted with above. She demonstrates the below listed deficits and will benefit from continued OT to maximize safety and independence with BADLs.  Pt presents to OT with generalized weakness.  Currently, she requires min A for ADLs and functional mobility.  Recommend return to ALF with HHOT and PT.  Will follow acutely.       Follow Up Recommendations  Home health OT;Supervision/Assistance - 24 hour (ALF )    Equipment Recommendations  None recommended by OT    Recommendations for Other Services       Precautions / Restrictions Precautions Precautions: Fall      Mobility Bed Mobility Overal bed mobility: Needs Assistance Bed Mobility: Supine to Sit;Sit to Supine     Supine to sit: Min assist Sit to supine: Min guard   General bed mobility comments: Pt required light min A to lift shoulders from bed   Transfers Overall transfer level: Needs assistance Equipment used: Rolling walker (2 wheeled) Transfers: Sit to/from UGI CorporationStand;Stand Pivot Transfers Sit to Stand: Min assist Stand pivot transfers: Min assist       General transfer comment: Assist for balance     Balance Overall balance assessment: Needs assistance Sitting-balance support: Feet supported Sitting balance-Leahy Scale: Good     Standing balance support: During functional activity Standing balance-Leahy Scale: Poor Standing balance comment: requires UE support and min A                             ADL Overall ADL's : Needs assistance/impaired Eating/Feeding: Independent;Sitting   Grooming: Wash/dry hands;Wash/dry face;Oral care;Brushing hair;Minimal assistance;Standing   Upper Body  Bathing: Set up;Sitting   Lower Body Bathing: Minimal assistance;Sit to/from stand   Upper Body Dressing : Set up;Sitting   Lower Body Dressing: Minimal assistance;Sit to/from stand   Toilet Transfer: Minimal assistance;Ambulation   Toileting- Clothing Manipulation and Hygiene: Minimal assistance;Sit to/from stand       Functional mobility during ADLs: Minimal assistance;Rolling walker General ADL Comments: Pt requires min A for balance      Vision     Perception     Praxis      Pertinent Vitals/Pain Pain Assessment: Faces Faces Pain Scale: Hurts little more Pain Location: Lt hip  Pain Descriptors / Indicators: Aching;Constant Pain Intervention(s): Monitored during session     Hand Dominance     Extremity/Trunk Assessment Upper Extremity Assessment Upper Extremity Assessment: Generalized weakness   Lower Extremity Assessment Lower Extremity Assessment: Generalized weakness   Cervical / Trunk Assessment Cervical / Trunk Assessment: Kyphotic   Communication Communication Communication: HOH   Cognition Arousal/Alertness: Awake/alert Behavior During Therapy: WFL for tasks assessed/performed Overall Cognitive Status: History of cognitive impairments - at baseline                     General Comments       Exercises       Shoulder Instructions      Home Living Family/patient expects to be discharged to:: Assisted living  Home Equipment: Walker - 2 wheels;Wheelchair - manual          Prior Functioning/Environment Level of Independence: Needs assistance  Gait / Transfers Assistance Needed: Pt ambulated in her room with RW, but used w/c all other times.  h/o falls  ADL's / Homemaking Assistance Needed: assistance in/out of shower and to wash back   Comments: uses walker inside room and when working with therapy. uses wheelchair all other time    OT Diagnosis: Generalized weakness;Acute pain   OT  Problem List: Decreased strength;Decreased activity tolerance;Impaired balance (sitting and/or standing);Decreased safety awareness;Pain   OT Treatment/Interventions: Self-care/ADL training;DME and/or AE instruction;Therapeutic activities;Cognitive remediation/compensation;Patient/family education;Balance training    OT Goals(Current goals can be found in the care plan section) Acute Rehab OT Goals Patient Stated Goal: To get back to [facility] in order to eventually go back to her home, and care for her dog  OT Goal Formulation: With patient Time For Goal Achievement: 05/14/15 Potential to Achieve Goals: Good ADL Goals Pt Will Perform Grooming: with min guard assist;standing Pt Will Perform Lower Body Bathing: with min guard assist;sit to/from stand Pt Will Perform Lower Body Dressing: sit to/from stand;with min guard assist Pt Will Transfer to Toilet: with min guard assist;ambulating;regular height toilet;bedside commode;grab bars Pt Will Perform Toileting - Clothing Manipulation and hygiene: with min guard assist;sit to/from stand  OT Frequency: Min 2X/week   Barriers to D/C:            Co-evaluation              End of Session    Activity Tolerance: Patient tolerated treatment well Patient left: in bed;with bed alarm set;with call bell/phone within reach   Time: 1436-1501 OT Time Calculation (min): 25 min Charges:  OT General Charges $OT Visit: 1 Procedure OT Evaluation $Initial OT Evaluation Tier I: 1 Procedure OT Treatments $Therapeutic Activity: 8-22 mins G-Codes:    Jillian Wheeler M 05/07/2015, 3:13 PM

## 2015-05-07 NOTE — Clinical Social Work Note (Signed)
Clinical Social Work Assessment  Patient Details  Name: Jillian Wheeler MRN: [id number] Date of Birth: 02-20-1925  Date of referral:  05/07/15               Reason for consult:  Facility Placement                Permission sought to share information with:  Facility Industrial/product designerContact Representative Permission granted to share information::  Yes, Verbal Permission Granted  Name::        Agency::     Relationship::     Contact Information:     Housing/Transportation Living arrangements for the past 2 months:  Assisted Living Facility, Skilled Nursing Facility Source of Information:  Patient, Adult Children Patient Interpreter Needed:  None Criminal Activity/Legal Involvement Pertinent to Current Situation/Hospitalization:  No - Comment as needed Significant Relationships:  Adult Children Lives with:  Facility Resident Do you feel safe going back to the place where you live?  Yes Need for family participation in patient care:  Yes (Comment)  Care giving concerns:  CSW received consult that [facility].    Social Worker assessment / plan:  CSW confirmed with patient [facility]   Employment status:  Retired Database administratornsurance information:  Managed Medicare PT Recommendations:  No Follow Up Information / Referral to community resources:     Patient/Family's Response to care:  CSW is familiar with patient from previous hospitalization where she came in from home but [facility]. Patient states that she has a good rehab experience there and then [facility] 6 [facility] and has been pleased with them.   Patient/Family's Understanding of and Emotional Response to Diagnosis, Current Treatment, and Prognosis:  Patient was pleased with herself that she did well with PT this morning, recommendation of no PT followup.   Emotional Assessment Appearance:   Appears stated age Attitude/Demeanor/Rapport:    Affect (typically observed):  Calm, Pleasant, Quiet Orientation:  Oriented to Self, Oriented to Place, Oriented to  Time, Oriented to Situation Alcohol / Substance use:    Psych involvement (Current and /or in the community):  No (Comment)  Discharge Needs  Concerns to be addressed:    Readmission within the last 30 days:    Current discharge risk:    Barriers to Discharge:      Arlyss RepressHarrison, Zelena Bushong F, LCSW 05/07/2015, 11:47 AM

## 2015-05-07 NOTE — Evaluation (Signed)
Physical Therapy Evaluation Patient Details Name: Nilda Riggstta C Fleece MRN: [id number] DOB: 01-22-1925 Today's Date: 05/07/2015   History of Present Illness  81 yo female admitted with lower abdominal pain. Hx of DM, essential HTn, back pain, mild dementia. Pt is from an ALF  Clinical Impression  On eval, pt required Min assist for mobility-able to ambulate ~15'x2 with RW. Pt only walks short distances at baseline (in ALF room only); otherwise she uses wheelchair. Recommend return to ALF.    Follow Up Recommendations No PT follow up (return to ALF)    Equipment Recommendations  None recommended by PT    Recommendations for Other Services       Precautions / Restrictions Precautions Precautions: Fall Restrictions Weight Bearing Restrictions: No      Mobility  Bed Mobility Overal bed mobility: Needs Assistance Bed Mobility: Supine to Sit;Sit to Supine     Supine to sit: HOB elevated;Min guard Sit to supine: Min assist   General bed mobility comments: small amount of assist for LEs back into bed.   Transfers Overall transfer level: Needs assistance Equipment used: Rolling walker (2 wheeled) Transfers: Sit to/from Stand Sit to Stand: Min assist         General transfer comment: Assist to rise (especially from low surface). VCs safety, hand placement  Ambulation/Gait Ambulation/Gait assistance: Min guard Ambulation Distance (Feet): 15 Feet (x2) Assistive device: Rolling walker (2 wheeled) Gait Pattern/deviations: Step-through pattern;Decreased stride length;Trunk flexed     General Gait Details: close guard for safety. slow gait speed.   Stairs            Wheelchair Mobility    Modified Rankin (Stroke Patients Only)       Balance Overall balance assessment: Needs assistance         Standing balance support: Bilateral upper extremity supported;During functional activity Standing balance-Leahy Scale: Poor Standing balance comment: needs RW                              Pertinent Vitals/Pain Pain Assessment: 0-10 Pain Score: 5  Pain Location: L hip    Home Living Family/patient expects to be discharged to:: Assisted living               Home Equipment: Walker - 2 wheels;Wheelchair - manual      Prior Function Level of Independence: Needs assistance      ADL's / Homemaking Assistance Needed: assistance in/out of shower and to wash back  Comments: uses walker inside room and when working with therapy. uses wheelchair all other time     Hand Dominance        Extremity/Trunk Assessment   Upper Extremity Assessment: Generalized weakness           Lower Extremity Assessment: Generalized weakness      Cervical / Trunk Assessment: Kyphotic  Communication   Communication: HOH  Cognition Arousal/Alertness: Awake/alert Behavior During Therapy: WFL for tasks assessed/performed Overall Cognitive Status: Within Functional Limits for tasks assessed                      General Comments      Exercises        Assessment/Plan    PT Assessment Patient needs continued PT services  PT Diagnosis Difficulty walking;Generalized weakness   PT Problem List Decreased mobility;Decreased activity tolerance;Decreased balance;Decreased strength  PT Treatment Interventions DME instruction;Gait training;Functional mobility training;Therapeutic activities;Therapeutic exercise;Patient/family education;Balance training  PT Goals (Current goals can be found in the Care Plan section) Acute Rehab PT Goals Patient Stated Goal: none stated PT Goal Formulation: With patient Time For Goal Achievement: 05/21/15 Potential to Achieve Goals: Fair    Frequency Min 3X/week   Barriers to discharge        Co-evaluation               End of Session   Activity Tolerance: Patient tolerated treatment well Patient left: in bed;with call bell/phone within reach;with bed alarm set           Time:  [id number] PT Time Calculation (min) (ACUTE ONLY): 16 min   Charges:   PT Evaluation $Initial PT Evaluation Tier I: 1 Procedure     PT G Codes:        Rebeca AlertJannie Zalaya Astarita, MPT Pager: [phone]

## 2015-05-08 LAB — URINE CULTURE: Colony Count: 45000

## 2015-05-08 LAB — GLUCOSE, CAPILLARY
GLUCOSE-CAPILLARY: 134 mg/dL — AB (ref 65–99)
Glucose-Capillary: 101 mg/dL — ABNORMAL HIGH (ref 65–99)
Glucose-Capillary: 157 mg/dL — ABNORMAL HIGH (ref 65–99)
Glucose-Capillary: 125 mg/dL — ABNORMAL HIGH (ref 65–99)

## 2015-05-08 LAB — BASIC METABOLIC PANEL
Anion gap: 9 (ref 5–15)
BUN: 9 mg/dL (ref 6–20)
CO2: 23 mmol/L (ref 22–32)
Calcium: 8.8 mg/dL — ABNORMAL LOW (ref 8.9–10.3)
Chloride: 87 mmol/L — ABNORMAL LOW (ref 101–111)
Creatinine, Ser: 0.74 mg/dL (ref 0.44–1.00)
GFR calc Af Amer: >60 mL/min (ref >60)
GFR calc non Af Amer: >60 mL/min (ref >60)
GLUCOSE: 105 mg/dL — AB (ref 65–99)
Potassium: 3.5 mmol/L (ref 3.5–5.1)
Sodium: 119 mmol/L — CL (ref 135–145)

## 2015-05-08 MED ORDER — DEMECLOCYCLINE HCL 150 MG PO TABS
150.0000 mg | ORAL_TABLET | Freq: Four times a day (QID) | ORAL | Status: DC
  Administered 2015-05-08 – 2015-05-11 (×12): 150 mg via ORAL
  Filled 2015-05-08 (×17): qty 1

## 2015-05-08 NOTE — Progress Notes (Addendum)
CRITICAL VALUE ALERT  Critical value received:  Na 119  Date of notification:  05/08/15  Time of notification:  0554  Critical value read back: Yes  Nurse who received alert: Brent BullaJilian Alean Kromer RN  MD notified (1st page):  Dr. Burnadette PeterLynch  Time of first page:  0555  MD notified (2nd page):  Time of second page:  Responding MD: Dr. Burnadette PeterLynch [phone]  Time MD responded:  MD aware, no new interventions at this time

## 2015-05-08 NOTE — Progress Notes (Addendum)
Progress Note   Jillian Wheeler ZOX:[id number] DOB: September 10, 1925 DOA: 05/06/2015 PCP: Hoyle SauerAVVA,RAVISANKAR R, MD   Brief Narrative:   Jillian Wheeler is an 81 y.o. female resident of [facility], with history of diet-controlled DM 2, stage III CKD, B12 deficiency, essential hypertension, constipation, prior falls, possible mild dementia, GERD, hypothyroid, chronic diastolic CHF, hospitalization in March 2016 for multifactorial hyponatremia, who was admitted 05/06/15 with complaints of abdominal pain. In the ED, patient was hemodynamically stable, ultrasound and CT of abdomen did not show acute findings. Lab work significant for sodium of 118, potassium 3.1 and hemoglobin 11.4. Urine microscopy not impressive for UTI.   Assessment/Plan:   Principal Problem:  Lower abdominal pain - This is subacute/acute on chronic intermittent pain of unclear etiology. - Clinical exam and imaging studies (CT and ultrasound abdomen) without acute findings. - ? Functional related to constipation/? IBS. - Patient states she had dysuria in the ED but urine microscopy not impressive for UTI. F/U urine culture results, not on antibiotics. - Chronic intermittent abdominal pain with profound weight loss is concerning but no mass lesion seen on CT abdomen. - Currently pain controlled with medications. - Started bowel regimen, continue PPI.  Active Problems:  Hyponatremia - Likely subacute on chronic.    - Continue gentle hydration with IV normal saline and aim to increase sodium by approximately 8 mEq per 24 hours. - Continue fluid restriction and start demeclocycline. - Asymptomatic of new mental status changes. - Urine osmolality/serum osmolality both low. Urine sodium 71.  Findings consistent with SIADH - TSH recently was normal.   MRSA carrier - Contact precautions/decontamination therapy ordered.    Hypokalemia - Replaced.     Hyperlipidemia - Continue statin.   DIASTOLIC HEART FAILURE, CHRONIC -  Compensated.   GERD - Continue PPI.   Anemia - Chronic and stable. - Follow CBCs.  Diet-controlled DM 2 - Monitor CBGs: 101-192.  Cholelithiasis without cholecystitis - Seen on imaging.  L5 compression fracture status post augmentation - Stable.  Chronic constipation - Continue Linzess, MiraLAX and senna.  History of falls - Last fall was approximately 3-4 weeks ago. - PT/OT evaluations performed.   Code Status: DNR  Family Communication: Dorann LodgeJuanita /POA by telephone.  [facility], possibly in 2-3 days.    IV Access:    Peripheral IV   Procedures and diagnostic studies:   [place] Abdomen Complete  05/06/2015   CLINICAL DATA:  Abdominal pain  EXAM: ULTRASOUND ABDOMEN COMPLETE  COMPARISON:  05/06/2015  FINDINGS: Gallbladder: Single 1.4 cm gallstone is identified. No significant wall thickening or pericholecystic fluid is noted. A negative sonographic Eulah PontMurphy sign is noted.  Common bile duct: Diameter: 3 mm.  Liver: No focal lesion identified. Within normal limits in parenchymal echogenicity.  IVC: No abnormality visualized.  Pancreas: Not well visualized due to overlying bowel gas.  Spleen: Size and appearance within normal limits.  Right Kidney: Length: 10.4 cm. Echogenicity within normal limits. No mass or hydronephrosis visualized.  Left Kidney: Length: 10.2 cm. Echogenicity within normal limits. No mass or hydronephrosis visualized.  Abdominal aorta: No aneurysm visualized.  Other findings: None.  IMPRESSION: Cholelithiasis without significant wall thickening or pericholecystic fluid.  No other focal abnormality is noted.   Electronically Signed   By: Alcide CleverMark  Lukens M.D.   On: 05/06/2015 15:30   Ct Abdomen Pelvis W Contrast  05/06/2015   CLINICAL DATA:  Lower abdominal pain.  Nausea since yesterday.  EXAM:  CT ABDOMEN AND PELVIS WITH CONTRAST  TECHNIQUE: Multidetector CT imaging of the abdomen and pelvis was performed using the  standard protocol following bolus administration of intravenous contrast.  CONTRAST:  50mL OMNIPAQUE IOHEXOL 300 MG/ML SOLN, OMNIPAQUE IOHEXOL 300 MG/ML SOLN  COMPARISON:  12/23/2014  FINDINGS: The lung bases are clear.  The liver demonstrates no focal abnormality. There is no intrahepatic or extrahepatic biliary ductal dilatation. Mild pericholecystic fluid and mild gallbladder wall thickening. The spleen demonstrates no focal abnormality. The pancreas is normal. There is a 16 mm hypodense, fluid attenuating left interpolar renal mass most consistent with a cyst. There is a 4 mm hypodense lesion in the right interpolar aspect of the kidney too small to characterize. There is no obstructive uropathy. The adrenal glands are normal. The bladder is unremarkable.  The stomach, duodenum, small intestine, and large intestine demonstrate no contrast extravasation or dilatation. There is a moderate size hiatal hernia. There is a small fat containing umbilical hernia. There is no pneumoperitoneum, pneumatosis, or portal venous gas. There is no abdominal or pelvic free fluid. There is no lymphadenopathy.  The abdominal aorta is normal in caliber with atherosclerosis.  There are no lytic or sclerotic osseous lesions. There is a chronic L5 vertebral body compression fracture status post augmentation. There is methylmethacrylate within the L5 vertebral body with extrusion of methylmethacrylate into the spinal canal along the right paracentral aspect and extending into the right neural foramen with overall right lateral recess stenosis at L5-S1 and right foraminal stenosis. There is bilateral facet arthropathy throughout the lumbar spine. There is grade 1 anterolisthesis of L4 on L5. There is bilateral foraminal stenosis at L4-5.  IMPRESSION: 1. Mild gallbladder wall thickening and pericholecystic fluid as can be seen with acute cholecystitis. Correlate with clinical exam. If there is further clinical concern, recommend a  right upper quadrant ultrasound. 2. There is a chronic L5 vertebral body compression fracture status post augmentation. There is methylmethacrylate within the L5 vertebral body with extrusion of methylmethacrylate into the spinal canal along the right paracentral aspect and extending into the right neural foramen with overall right lateral recess stenosis at L5-S1 and right foraminal stenosis.   Electronically Signed   By: Elige Ko   On: 05/06/2015 14:08     Medical Consultants:    None.  Anti-Infectives:    None.  Subjective:   AIDEN RAO says she feels "pretty good", but feels sleepy.  No nausea, still reports abdominal pain, relieved with pain medications.  Appetite poor.  Objective:    Filed Vitals:   05/07/15 0520 05/07/15 1521 05/07/15 2137 05/08/15 0442  BP: 139/51 137/49 146/56 143/60  Pulse: 81 76 77 72  Temp: 97.9 F (36.6 C) 97.7 F (36.5 C) 97.8 F (36.6 C) 97.6 F (36.4 C)  TempSrc: Oral Oral Oral Oral  Resp:  Height:      Weight:   67 kg (147 lb 11.3 oz) 67.1 kg (147 lb 14.9 oz)  SpO2: 100% 100% 100% 100%    Intake/Output Summary (Last 24 hours) at 05/08/15 1610 Last data filed at 05/08/15 0159  Gross per 24 hour  Intake    480 ml  Output   1250 ml  Net   -770 ml    Exam: Gen:  NAD, sleepy Cardiovascular:  RRR, No M/R/G Respiratory:  Lungs CTAB Gastrointestinal:  Abdomen soft, NT/ND, + BS Extremities:  No C/E/C   Data Reviewed:    Labs: Basic Metabolic Panel:  Recent Labs Lab 05/06/15 1230 05/07/15 0511 05/08/15 0501  NA 118* 120* 119*  K 3.1* 4.3 3.5  CL 81* 88* 87*  CO2 GLUCOSE 124* 91 105*  BUN CREATININE 0.93 0.79 0.74  CALCIUM 9.6 8.9 8.8*   GFR Estimated Creatinine Clearance: 43.7 mL/min (by C-G formula based on Cr of 0.74). Liver Function Tests:  Recent Labs Lab 05/06/15 1230  AST 23  ALT 20  ALKPHOS 85  BILITOT 0.6  PROT 6.7  ALBUMIN 3.6   CBC:  Recent Labs Lab  05/06/15 1230 05/07/15 0511  WBC 10.2 11.5*  HGB 11.4* 10.6*  HCT 31.6* 29.6*  MCV 96.9 99.0  PLT 338 290   CBG:  Recent Labs Lab 05/06/15 2121 05/07/15 0759 05/07/15 1204 05/07/15 1714 05/07/15 2134  GLUCAP 192* 101* 111* 105* 118*    Microbiology Recent Results (from the past 240 hour(s))  Urine culture     Status: None (Preliminary result)   Collection Time: 05/06/15  4:19 PM  Result Value Ref Range Status   Specimen Description URINE, RANDOM  Final   Special Requests NONE  Final   Colony Count   Final    45,000 COLONIES/ML Performed at [facility]    Culture   Final    GRAM NEGATIVE RODS Performed at [facility]    Report Status PENDING  Incomplete  MRSA PCR Screening     Status: Abnormal   Collection Time: 05/07/15 12:30 AM  Result Value Ref Range Status   MRSA by PCR POSITIVE (A) NEGATIVE Final    Comment:        The GeneXpert MRSA Assay (FDA approved for NASAL specimens only), is one component of a comprehensive MRSA colonization surveillance program. It is not intended to diagnose MRSA infection nor to guide or monitor treatment for MRSA infections. RESULT CALLED TO, READ BACK BY AND VERIFIED WITH: L REINERT @ 0428 ON 05/07/15 BY C DAVIS      Medications:   . allopurinol  100 mg Oral Daily  . amLODipine  5 mg Oral Daily  . atorvastatin  20 mg Oral Daily  . Chlorhexidine Gluconate Cloth  6 each Topical Q0600  . demeclocycline  150 mg Oral 4 times per day  . enoxaparin (LOVENOX) injection  40 mg Subcutaneous Q24H  . levothyroxine  88 mcg Oral QAC breakfast  . Linaclotide  145 mcg Oral Daily  . mupirocin ointment  1 application Nasal BID  . polyethylene glycol  17 g Oral Daily  . senna  1 tablet Oral BID  . sodium chloride  3 mL Intravenous Q12H   Continuous Infusions: . sodium chloride 100 mL/hr at 05/07/15 1351    Time spent: 35 minutes with > 50% of time discussing current diagnostic test results, clinical  impression and plan of care, including extensive discussion with the daughter.   LOS: 2 days   Lucifer Soja  [facility] Pager [phone]. If unable to reach me by pager, please call my cell phone at [phone].  *Please refer to [email or web address], password [email or web address] to get updated schedule on who will round on this patient, as hospitalists switch teams weekly. If 7PM-7AM, please contact night-coverage at [email or web address], password [email or web address] for any overnight needs.  05/08/2015, 7:52 AM

## 2015-05-09 ENCOUNTER — Encounter (HOSPITAL_COMMUNITY): Payer: Self-pay | Admitting: Internal Medicine

## 2015-05-09 DIAGNOSIS — E222 Syndrome of inappropriate secretion of antidiuretic hormone: Secondary | ICD-10-CM

## 2015-05-09 HISTORY — DX: Syndrome of inappropriate secretion of antidiuretic hormone: E22.2

## 2015-05-09 LAB — GLUCOSE, CAPILLARY
GLUCOSE-CAPILLARY: 113 mg/dL — AB (ref 65–99)
GLUCOSE-CAPILLARY: 122 mg/dL — AB (ref 65–99)
Glucose-Capillary: 92 mg/dL (ref 65–99)
Glucose-Capillary: 124 mg/dL — ABNORMAL HIGH (ref 65–99)

## 2015-05-09 LAB — BASIC METABOLIC PANEL WITH GFR
Anion gap: 8 (ref 5–15)
BUN: 9 mg/dL (ref 6–20)
CO2: 22 mmol/L (ref 22–32)
Calcium: 8.5 mg/dL — ABNORMAL LOW (ref 8.9–10.3)
Chloride: 91 mmol/L — ABNORMAL LOW (ref 101–111)
Creatinine, Ser: 0.7 mg/dL (ref 0.44–1.00)
GFR calc Af Amer: >60 mL/min (ref >60)
GFR calc non Af Amer: >60 mL/min (ref >60)
Glucose, Bld: 92 mg/dL (ref 65–99)
Potassium: 3.2 mmol/L — ABNORMAL LOW (ref 3.5–5.1)
Sodium: 121 mmol/L — ABNORMAL LOW (ref 135–145)

## 2015-05-09 MED ORDER — OMEGA-3-ACID ETHYL ESTERS 1 G PO CAPS
1000.0000 mg | ORAL_CAPSULE | Freq: Every day | ORAL | Status: DC
  Administered 2015-05-09 – 2015-05-13 (×5): 1000 mg via ORAL
  Filled 2015-05-09 (×5): qty 1

## 2015-05-09 MED ORDER — CEFTRIAXONE SODIUM IN DEXTROSE 20 MG/ML IV SOLN
1.0000 g | Freq: Every day | INTRAVENOUS | Status: AC
  Administered 2015-05-09 – 2015-05-11 (×3): 1 g via INTRAVENOUS
  Filled 2015-05-09 (×3): qty 50

## 2015-05-09 MED ORDER — POTASSIUM CHLORIDE CRYS ER 20 MEQ PO TBCR
20.0000 meq | EXTENDED_RELEASE_TABLET | Freq: Two times a day (BID) | ORAL | Status: DC
  Administered 2015-05-09 – 2015-05-13 (×9): 20 meq via ORAL
  Filled 2015-05-09 (×9): qty 1

## 2015-05-09 MED ORDER — PROSIGHT PO TABS
ORAL_TABLET | ORAL | Status: DC
  Administered 2015-05-09 – 2015-05-12 (×6): 1 via ORAL
  Administered 2015-05-13: 3 via ORAL
  Filled 2015-05-09 (×10): qty 1

## 2015-05-09 MED ORDER — CALCIUM CARBONATE-VITAMIN D 500-200 MG-UNIT PO TABS
1.0000 | ORAL_TABLET | Freq: Every day | ORAL | Status: DC
  Administered 2015-05-09 – 2015-05-13 (×5): 1 via ORAL
  Filled 2015-05-09 (×8): qty 1

## 2015-05-09 MED ORDER — ADULT MULTIVITAMIN W/MINERALS CH
1.0000 | ORAL_TABLET | Freq: Every day | ORAL | Status: DC
  Administered 2015-05-09 – 2015-05-13 (×5): 1 via ORAL
  Filled 2015-05-09 (×8): qty 1

## 2015-05-09 MED ORDER — DRONABINOL 2.5 MG PO CAPS
2.5000 mg | ORAL_CAPSULE | Freq: Two times a day (BID) | ORAL | Status: DC
  Administered 2015-05-09 – 2015-05-12 (×8): 2.5 mg via ORAL
  Filled 2015-05-09 (×8): qty 1

## 2015-05-09 NOTE — Care Management Note (Signed)
Medicare Important Message given? YES  Date Medicare IM given: 05/09/15 Medicare IM given by: Anh Mangano RN CCM 

## 2015-05-09 NOTE — Progress Notes (Addendum)
Progress Note   Jillian Wheeler ZHY:[id number] DOB: 07-Apr-1925 DOA: 05/06/2015 PCP: Hoyle Sauer, MD   Brief Narrative:   Jillian Wheeler is an 81 y.o. female resident of [facility], with history of diet-controlled DM 2, stage III CKD, B12 deficiency, essential hypertension, constipation, prior falls, possible mild dementia, GERD, hypothyroid, chronic diastolic CHF, hospitalization in March 2016 for multifactorial hyponatremia, who was admitted 05/06/15 with complaints of abdominal pain. In the ED, patient was hemodynamically stable, ultrasound and CT of abdomen did not show acute findings. Lab work significant for sodium of 118, potassium 3.1 and hemoglobin 11.4. Urine microscopy not impressive for UTI.   Assessment/Plan:   Principal Problem:  Klebsiella pneumonia UTI/Lower abdominal pain - This is subacute/acute on chronic intermittent pain of unclear etiology. - Clinical exam and imaging studies (CT and ultrasound abdomen) without acute findings. - ? Functional related to constipation/? IBS (on Linzess) versus Klebsiella UTI. Start Rocephin. - Currently pain controlled with medications. - Continue bowel regimen. Not on PPI.  Active Problems:  Hyponatremia secondary to SIADH - Likely subacute on chronic.  TSH recently was normal. - Urine osmolality/serum osmolality both low. Urine sodium 71.  Findings consistent with SIADH. - Continue gentle hydration with IV normal saline and aim to increase sodium by approximately 8 mEq per 24 hours. - Continue fluid restriction and demeclocycline, which was started 05/08/15. - Sodium very slowly improving.   MRSA carrier - Contact precautions/decontamination therapy ordered.    Hypokalemia - Start on routine supplementation, 20 mEq twice a day.     Hyperlipidemia - Continue statin.   DIASTOLIC HEART FAILURE, CHRONIC - Compensated.   GERD - Continue PPI.   Anemia - Chronic and stable. - Follow CBCs.  Diet-controlled DM  2 - Monitor CBGs: 92-157.  Cholelithiasis without cholecystitis - Seen on imaging.  L5 compression fracture status post augmentation - Stable.  Chronic constipation - Continue Linzess, MiraLAX and senna.  History of falls - Last fall was approximately 3-4 weeks ago. - PT/OT evaluations performed.   Code Status: DNR  Family Communication: [name] /POA at the bedside.  Disposition Plan: D/C to [facility] when medically stable, possibly in 1-2 days if sodium improved.    IV Access:    Peripheral IV   Procedures and diagnostic studies:   [place] Abdomen Complete  05/06/2015   CLINICAL DATA:  Abdominal pain  EXAM: ULTRASOUND ABDOMEN COMPLETE  COMPARISON:  05/06/2015  FINDINGS: Gallbladder: Single 1.4 cm gallstone is identified. No significant wall thickening or pericholecystic fluid is noted. A negative sonographic Eulah Pont sign is noted.  Common bile duct: Diameter: 3 mm.  Liver: No focal lesion identified. Within normal limits in parenchymal echogenicity.  IVC: No abnormality visualized.  Pancreas: Not well visualized due to overlying bowel gas.  Spleen: Size and appearance within normal limits.  Right Kidney: Length: 10.4 cm. Echogenicity within normal limits. No mass or hydronephrosis visualized.  Left Kidney: Length: 10.2 cm. Echogenicity within normal limits. No mass or hydronephrosis visualized.  Abdominal aorta: No aneurysm visualized.  Other findings: None.  IMPRESSION: Cholelithiasis without significant wall thickening or pericholecystic fluid.  No other focal abnormality is noted.   Electronically Signed   By: Alcide Clever M.D.   On: 05/06/2015 15:30   Ct Abdomen Pelvis W Contrast  05/06/2015   CLINICAL DATA:  Lower abdominal pain.  Nausea since yesterday.  EXAM: CT ABDOMEN AND PELVIS WITH CONTRAST  TECHNIQUE: Multidetector CT imaging of the abdomen and pelvis was performed using  the standard protocol following bolus administration of intravenous contrast.  CONTRAST:  50mL  OMNIPAQUE IOHEXOL 300 MG/ML SOLN, OMNIPAQUE IOHEXOL 300 MG/ML SOLN  COMPARISON:  12/23/2014  FINDINGS: The lung bases are clear.  The liver demonstrates no focal abnormality. There is no intrahepatic or extrahepatic biliary ductal dilatation. Mild pericholecystic fluid and mild gallbladder wall thickening. The spleen demonstrates no focal abnormality. The pancreas is normal. There is a 16 mm hypodense, fluid attenuating left interpolar renal mass most consistent with a cyst. There is a 4 mm hypodense lesion in the right interpolar aspect of the kidney too small to characterize. There is no obstructive uropathy. The adrenal glands are normal. The bladder is unremarkable.  The stomach, duodenum, small intestine, and large intestine demonstrate no contrast extravasation or dilatation. There is a moderate size hiatal hernia. There is a small fat containing umbilical hernia. There is no pneumoperitoneum, pneumatosis, or portal venous gas. There is no abdominal or pelvic free fluid. There is no lymphadenopathy.  The abdominal aorta is normal in caliber with atherosclerosis.  There are no lytic or sclerotic osseous lesions. There is a chronic L5 vertebral body compression fracture status post augmentation. There is methylmethacrylate within the L5 vertebral body with extrusion of methylmethacrylate into the spinal canal along the right paracentral aspect and extending into the right neural foramen with overall right lateral recess stenosis at L5-S1 and right foraminal stenosis. There is bilateral facet arthropathy throughout the lumbar spine. There is grade 1 anterolisthesis of L4 on L5. There is bilateral foraminal stenosis at L4-5.  IMPRESSION: 1. Mild gallbladder wall thickening and pericholecystic fluid as can be seen with acute cholecystitis. Correlate with clinical exam. If there is further clinical concern, recommend a right upper quadrant ultrasound. 2. There is a chronic L5 vertebral body compression  fracture status post augmentation. There is methylmethacrylate within the L5 vertebral body with extrusion of methylmethacrylate into the spinal canal along the right paracentral aspect and extending into the right neural foramen with overall right lateral recess stenosis at L5-S1 and right foraminal stenosis.   Electronically Signed   By: Elige Ko   On: 05/06/2015 14:08     Medical Consultants:    None.  Anti-Infectives:    None.  Subjective:   Jillian Wheeler says her left hip hurts but that her abdominal pain is better.  Had a good BM yesterday.  Still with no appetite.    Objective:    Filed Vitals:   05/08/15 0442 05/08/15 1430 05/08/15 2100 05/09/15 0544  BP: 143/60 94/53 150/47 141/54  Pulse: 72 79 75 77  Temp: 97.6 F (36.4 C) 97.3 F (36.3 C) 97.5 F (36.4 C) 98.2 F (36.8 C)  TempSrc: Oral Oral Oral Oral  Resp: Height:      Weight: 67.1 kg (147 lb 14.9 oz)   67 kg (147 lb 11.3 oz)  SpO2: 100% 100% 97% 98%    Intake/Output Summary (Last 24 hours) at 05/09/15 0820 Last data filed at 05/09/15 0700  Gross per 24 hour  Intake   2540 ml  Output   1100 ml  Net   1440 ml    Exam: Gen:  NAD, sleepy Cardiovascular:  RRR, No M/R/G Respiratory:  Lungs CTAB Gastrointestinal:  Abdomen soft, NT/ND, + BS Extremities:  No C/E/C   Data Reviewed:    Labs: Basic Metabolic Panel:  Recent Labs Lab 05/06/15 1230 05/07/15 0511 05/08/15 0501 05/09/15 0510  NA 118* 120* 119*  121*  K 3.1* 4.3 3.5 3.2*  CL 81* 88* 87* 91*  CO2 GLUCOSE 124* 91 105* 92  BUN CREATININE 0.93 0.79 0.74 0.70  CALCIUM 9.6 8.9 8.8* 8.5*   GFR Estimated Creatinine Clearance: 43.7 mL/min (by C-G formula based on Cr of 0.7). Liver Function Tests:  Recent Labs Lab 05/06/15 1230  AST 23  ALT 20  ALKPHOS 85  BILITOT 0.6  PROT 6.7  ALBUMIN 3.6   CBC:  Recent Labs Lab 05/06/15 1230 05/07/15 0511  WBC 10.2 11.5*  HGB 11.4* 10.6*  HCT  31.6* 29.6*  MCV 96.9 99.0  PLT 338 290   CBG:  Recent Labs Lab 05/08/15 0739 05/08/15 1213 05/08/15 1639 05/08/15 2222 05/09/15 0729  GLUCAP 101* 157* 134* 125* 92    Microbiology Recent Results (from the past 240 hour(s))  Urine culture     Status: None   Collection Time: 05/06/15  4:19 PM  Result Value Ref Range Status   Specimen Description URINE, RANDOM  Final   Special Requests NONE  Final   Colony Count   Final    45,000 COLONIES/ML Performed at [facility]    Culture   Final    KLEBSIELLA PNEUMONIAE Performed at [facility]    Report Status 05/08/2015 FINAL  Final   Organism ID, Bacteria KLEBSIELLA PNEUMONIAE  Final      Susceptibility   Klebsiella pneumoniae - MIC*    AMPICILLIN >=32 RESISTANT Resistant     CEFAZOLIN <=4 SENSITIVE Sensitive     CEFTRIAXONE <=1 SENSITIVE Sensitive     CIPROFLOXACIN <=0.25 SENSITIVE Sensitive     GENTAMICIN <=1 SENSITIVE Sensitive     LEVOFLOXACIN <=0.12 SENSITIVE Sensitive     NITROFURANTOIN 32 SENSITIVE Sensitive     TOBRAMYCIN <=1 SENSITIVE Sensitive     TRIMETH/SULFA <=20 SENSITIVE Sensitive     PIP/TAZO <=4 SENSITIVE Sensitive     * KLEBSIELLA PNEUMONIAE  MRSA PCR Screening     Status: Abnormal   Collection Time: 05/07/15 12:30 AM  Result Value Ref Range Status   MRSA by PCR POSITIVE (A) NEGATIVE Final    Comment:        The GeneXpert MRSA Assay (FDA approved for NASAL specimens only), is one component of a comprehensive MRSA colonization surveillance program. It is not intended to diagnose MRSA infection nor to guide or monitor treatment for MRSA infections. RESULT CALLED TO, READ BACK BY AND VERIFIED WITH: L REINERT @ 0428 ON 05/07/15 BY C DAVIS      Medications:   . allopurinol  100 mg Oral Daily  . amLODipine  5 mg Oral Daily  . atorvastatin  20 mg Oral Daily  . Chlorhexidine Gluconate Cloth  6 each Topical Q0600  . demeclocycline  150 mg Oral 4 times per day  . enoxaparin  (LOVENOX) injection  40 mg Subcutaneous Q24H  . levothyroxine  88 mcg Oral QAC breakfast  . Linaclotide  145 mcg Oral Daily  . mupirocin ointment  1 application Nasal BID  . polyethylene glycol  17 g Oral Daily  . senna  1 tablet Oral BID  . sodium chloride  3 mL Intravenous Q12H   Continuous Infusions: . sodium chloride 100 mL/hr at 05/08/15 2039    Time spent: 25 minutes.   LOS: 3 days   RAMA,CHRISTINA  [facility] Pager [phone]. If unable to reach me by pager, please call my cell phone at [phone].  *  Please refer to [email or web address], password [email or web address] to get updated schedule on who will round on this patient, as hospitalists switch teams weekly. If 7PM-7AM, please contact night-coverage at [email or web address], password [email or web address] for any overnight needs.  05/09/2015, 8:20 AM

## 2015-05-10 DIAGNOSIS — R1084 Generalized abdominal pain: Secondary | ICD-10-CM

## 2015-05-10 LAB — BASIC METABOLIC PANEL
Anion gap: 8 (ref 5–15)
BUN: 10 mg/dL (ref 6–20)
CALCIUM: 8.8 mg/dL — AB (ref 8.9–10.3)
CHLORIDE: 95 mmol/L — AB (ref 101–111)
CO2: 21 mmol/L — ABNORMAL LOW (ref 22–32)
CREATININE: 0.8 mg/dL (ref 0.44–1.00)
GFR calc non Af Amer: >60 mL/min (ref >60)
GLUCOSE: 84 mg/dL (ref 65–99)
Potassium: 3.7 mmol/L (ref 3.5–5.1)
SODIUM: 124 mmol/L — AB (ref 135–145)

## 2015-05-10 LAB — GLUCOSE, CAPILLARY
GLUCOSE-CAPILLARY: 92 mg/dL (ref 65–99)
Glucose-Capillary: 82 mg/dL (ref 65–99)
Glucose-Capillary: 105 mg/dL — ABNORMAL HIGH (ref 65–99)
Glucose-Capillary: 109 mg/dL — ABNORMAL HIGH (ref 65–99)

## 2015-05-10 MED ORDER — HYDROCODONE-ACETAMINOPHEN 5-325 MG PO TABS
1.0000 | ORAL_TABLET | ORAL | Status: DC | PRN
  Administered 2015-05-11: 2 via ORAL
  Filled 2015-05-10: qty 2

## 2015-05-10 MED ORDER — OXYCODONE HCL ER 10 MG PO T12A
10.0000 mg | EXTENDED_RELEASE_TABLET | Freq: Two times a day (BID) | ORAL | Status: DC
  Administered 2015-05-10 – 2015-05-13 (×7): 10 mg via ORAL
  Filled 2015-05-10 (×7): qty 1

## 2015-05-10 NOTE — Progress Notes (Signed)
Progress Note   ALVINA STROTHER Wheeler:[id number] DOB: [birth year] DOA: 05/06/2015 PCP: Hoyle Sauer, MD   Brief Narrative:   Jillian Wheeler is an 81 y.o. female resident of [facility], with history of diet-controlled DM 2, stage III CKD, B12 deficiency, essential hypertension, constipation, prior falls, possible mild dementia, GERD, hypothyroid, chronic diastolic CHF, hospitalization in March 2016 for multifactorial hyponatremia, who was admitted 05/06/15 with complaints of abdominal pain. In the ED, patient was hemodynamically stable, ultrasound and CT of abdomen did not show acute findings. Lab work significant for sodium of 118, potassium 3.1 and hemoglobin 11.4. Urine microscopy not impressive for UTI.   Assessment/Plan:   Principal Problem:  Klebsiella pneumonia UTI/Lower abdominal pain - This is subacute/acute on chronic intermittent pain likely multifactorial with hyponatremia and UTI contributory. - Clinical exam and imaging studies (CT and ultrasound abdomen) without acute findings. - ? Functional related to constipation/? IBS (on Linzess) versus Klebsiella UTI. Continue Rocephin. - Currently pain controlled with medications. - Continue bowel regimen. Not on PPI.  Active Problems:  Hyponatremia secondary to SIADH - Likely subacute on chronic.  TSH recently was normal. - Urine osmolality/serum osmolality both low. Urine sodium 71.  Findings consistent with SIADH. - Continue gentle hydration with IV normal saline and aim to increase sodium by approximately 8 mEq per 24 hours. - Continue fluid restriction and demeclocycline, which was started 05/08/15. - Sodium very slowly improving.   MRSA carrier - Contact precautions/decontamination therapy ordered.    Hypokalemia - Continue to supplement, calcium WNL.     Hyperlipidemia - Continue statin.   DIASTOLIC HEART FAILURE, CHRONIC - Compensated.   GERD - Continue PPI.   Anemia - Chronic and stable. - Follow  CBCs.  Diet-controlled DM 2 - Monitor CBGs: 92-124.  Cholelithiasis without cholecystitis - Seen on imaging.  L5 compression fracture status post augmentation - Stable.  Chronic constipation - Continue Linzess, MiraLAX and senna.  History of falls - Last fall was approximately 3-4 weeks ago. - PT/OT evaluations performed.   Code Status: DNR  Family Communication: [name] /POA at the bedside.  Disposition Plan: D/C to [facility] when medically stable, possibly in 1-2 days if sodium improved.    IV Access:    Peripheral IV   Procedures and diagnostic studies:   US Abdomen Complete  05/06/2015   CLINICAL DATA:  Abdominal pain  EXAM: ULTRASOUND ABDOMEN COMPLETE  COMPARISON:  05/06/2015  FINDINGS: Gallbladder: Single 1.4 cm gallstone is identified. No significant wall thickening or pericholecystic fluid is noted. A negative sonographic Eulah Pont sign is noted.  Common bile duct: Diameter: 3 mm.  Liver: No focal lesion identified. Within normal limits in parenchymal echogenicity.  IVC: No abnormality visualized.  Pancreas: Not well visualized due to overlying bowel gas.  Spleen: Size and appearance within normal limits.  Right Kidney: Length: 10.4 cm. Echogenicity within normal limits. No mass or hydronephrosis visualized.  Left Kidney: Length: 10.2 cm. Echogenicity within normal limits. No mass or hydronephrosis visualized.  Abdominal aorta: No aneurysm visualized.  Other findings: None.  IMPRESSION: Cholelithiasis without significant wall thickening or pericholecystic fluid.  No other focal abnormality is noted.   Electronically Signed   By: Alcide Clever M.D.   On: 05/06/2015 15:30   Ct Abdomen Pelvis W Contrast  05/06/2015   CLINICAL DATA:  Lower abdominal pain.  Nausea since yesterday.  EXAM: CT ABDOMEN AND PELVIS WITH CONTRAST  TECHNIQUE: Multidetector CT imaging of the abdomen and pelvis was performed using  the standard protocol following bolus administration of intravenous  contrast.  CONTRAST:  50mL OMNIPAQUE IOHEXOL 300 MG/ML SOLN, 100mL OMNIPAQUE IOHEXOL 300 MG/ML SOLN  COMPARISON:  12/23/2014  FINDINGS: The lung bases are clear.  The liver demonstrates no focal abnormality. There is no intrahepatic or extrahepatic biliary ductal dilatation. Mild pericholecystic fluid and mild gallbladder wall thickening. The spleen demonstrates no focal abnormality. The pancreas is normal. There is a 16 mm hypodense, fluid attenuating left interpolar renal mass most consistent with a cyst. There is a 4 mm hypodense lesion in the right interpolar aspect of the kidney too small to characterize. There is no obstructive uropathy. The adrenal glands are normal. The bladder is unremarkable.  The stomach, duodenum, small intestine, and large intestine demonstrate no contrast extravasation or dilatation. There is a moderate size hiatal hernia. There is a small fat containing umbilical hernia. There is no pneumoperitoneum, pneumatosis, or portal venous gas. There is no abdominal or pelvic free fluid. There is no lymphadenopathy.  The abdominal aorta is normal in caliber with atherosclerosis.  There are no lytic or sclerotic osseous lesions. There is a chronic L5 vertebral body compression fracture status post augmentation. There is methylmethacrylate within the L5 vertebral body with extrusion of methylmethacrylate into the spinal canal along the right paracentral aspect and extending into the right neural foramen with overall right lateral recess stenosis at L5-S1 and right foraminal stenosis. There is bilateral facet arthropathy throughout the lumbar spine. There is grade 1 anterolisthesis of L4 on L5. There is bilateral foraminal stenosis at L4-5.  IMPRESSION: 1. Mild gallbladder wall thickening and pericholecystic fluid as can be seen with acute cholecystitis. Correlate with clinical exam. If there is further clinical concern, recommend a right upper quadrant ultrasound. 2. There is a chronic L5  vertebral body compression fracture status post augmentation. There is methylmethacrylate within the L5 vertebral body with extrusion of methylmethacrylate into the spinal canal along the right paracentral aspect and extending into the right neural foramen with overall right lateral recess stenosis at L5-S1 and right foraminal stenosis.   Electronically Signed   By: Elige KoHetal  Patel   On: 05/06/2015 14:08     Medical Consultants:    None.  Anti-Infectives:    Rocephin 05/09/15--->  Subjective:   Nilda RiggsEtta C Belter says her left hip hurts and keeps her awake at night.  Occasional upper abdominal pain. No BM yesterday.  Still with no appetite despite starting Marinol.    Objective:    Filed Vitals:   05/09/15 0544 05/09/15 1234 05/09/15 2121 05/10/15 0527  BP: 141/54 106/52 123/53 134/46  Pulse: 77 82  74  Temp: 98.2 F (36.8 C) 97.3 F (36.3 C) 97.9 F (36.6 C) 97.3 F (36.3 C)  TempSrc: Oral Oral Oral Oral  Resp: 18 20 20 20   Height:      Weight: 67 kg (147 lb 11.3 oz)   67.1 kg (147 lb 14.9 oz)  SpO2: 98% 100% 100% 100%    Intake/Output Summary (Last 24 hours) at 05/10/15 0802 Last data filed at 05/10/15 0656  Gross per 24 hour  Intake 2493.33 ml  Output    700 ml  Net 1793.33 ml    Exam: Gen:  NAD, sleepy Cardiovascular:  RRR, No M/R/G Respiratory:  Lungs CTAB Gastrointestinal:  Abdomen soft, NT/ND, + BS Extremities:  No C/E/C   Data Reviewed:    Labs: Basic Metabolic Panel:  Recent Labs Lab 05/06/15 1230 05/07/15 0511 05/08/15 0501 05/09/15 0510 05/10/15 0540  NA 118* 120* 119* 121* 124*  K 3.1* 4.3 3.5 3.2* 3.7  CL 81* 88* 87* 91* 95*  CO2 21*  GLUCOSE 124* 91 105* 92 84  BUN CREATININE 0.93 0.79 0.74 0.70 0.80  CALCIUM 9.6 8.9 8.8* 8.5* 8.8*   GFR Estimated Creatinine Clearance: 43.7 mL/min (by C-G formula based on Cr of 0.8). Liver Function Tests:  Recent Labs Lab 05/06/15 1230  AST 23  ALT 20  ALKPHOS 85   BILITOT 0.6  PROT 6.7  ALBUMIN 3.6   CBC:  Recent Labs Lab 05/06/15 1230 05/07/15 0511  WBC 10.2 11.5*  HGB 11.4* 10.6*  HCT 31.6* 29.6*  MCV 96.9 99.0  PLT 338 290   CBG:  Recent Labs Lab 05/08/15 2222 05/09/15 0729 05/09/15 1153 05/09/15 1628 05/09/15 2200  GLUCAP 125* 92 113* 122* 124*    Microbiology Recent Results (from the past 240 hour(s))  Urine culture     Status: None   Collection Time: 05/06/15  4:19 PM  Result Value Ref Range Status   Specimen Description URINE, RANDOM  Final   Special Requests NONE  Final   Colony Count   Final    45,000 COLONIES/ML Performed at [facility]    Culture   Final    KLEBSIELLA PNEUMONIAE Performed at [facility]    Report Status 05/08/2015 FINAL  Final   Organism ID, Bacteria KLEBSIELLA PNEUMONIAE  Final      Susceptibility   Klebsiella pneumoniae - MIC*    AMPICILLIN >=32 RESISTANT Resistant     CEFAZOLIN <=4 SENSITIVE Sensitive     CEFTRIAXONE <=1 SENSITIVE Sensitive     CIPROFLOXACIN <=0.25 SENSITIVE Sensitive     GENTAMICIN <=1 SENSITIVE Sensitive     LEVOFLOXACIN <=0.12 SENSITIVE Sensitive     NITROFURANTOIN 32 SENSITIVE Sensitive     TOBRAMYCIN <=1 SENSITIVE Sensitive     TRIMETH/SULFA <=20 SENSITIVE Sensitive     PIP/TAZO <=4 SENSITIVE Sensitive     * KLEBSIELLA PNEUMONIAE  MRSA PCR Screening     Status: Abnormal   Collection Time: 05/07/15 12:30 AM  Result Value Ref Range Status   MRSA by PCR POSITIVE (A) NEGATIVE Final    Comment:        The GeneXpert MRSA Assay (FDA approved for NASAL specimens only), is one component of a comprehensive MRSA colonization surveillance program. It is not intended to diagnose MRSA infection nor to guide or monitor treatment for MRSA infections. RESULT CALLED TO, READ BACK BY AND VERIFIED WITH: L REINERT @ 0428 ON 05/07/15 BY C DAVIS      Medications:   . allopurinol  100 mg Oral Daily  . amLODipine  5 mg Oral Daily  . atorvastatin   20 mg Oral Daily  . calcium-vitamin D  1 tablet Oral Daily  . cefTRIAXone (ROCEPHIN)  IV  1 g Intravenous Daily  . Chlorhexidine Gluconate Cloth  6 each Topical Q0600  . demeclocycline  150 mg Oral 4 times per day  . dronabinol  2.5 mg Oral BID AC  . enoxaparin (LOVENOX) injection  40 mg Subcutaneous Q24H  . levothyroxine  88 mcg Oral QAC breakfast  . Linaclotide  145 mcg Oral Daily  . multivitamin   Oral 2 times per day  . multivitamin with minerals  1 tablet Oral Daily  . mupirocin ointment  1 application Nasal BID  . omega-3 acid ethyl esters  1,000 mg Oral Daily  .  polyethylene glycol  17 g Oral Daily  . potassium chloride  20 mEq Oral BID  . senna  1 tablet Oral BID  . sodium chloride  3 mL Intravenous Q12H   Continuous Infusions: . sodium chloride 100 mL/hr at 05/10/15 0240    Time spent: 25 minutes.   LOS: 4 days   Ona Rathert  [facility] Pager [phone]. If unable to reach me by pager, please call my cell phone at [phone].  *Please refer to [email or web address], password [email or web address] to get updated schedule on who will round on this patient, as hospitalists switch teams weekly. If 7PM-7AM, please contact night-coverage at [email or web address], password [email or web address] for any overnight needs.  05/10/2015, 8:02 AM

## 2015-05-10 NOTE — Progress Notes (Signed)
Physical Therapy Treatment Patient Details Name: Jillian Wheeler MRN: [id number] DOB: 1925/06/08 Today's Date: 05/10/2015    History of Present Illness 81 yo female admitted with lower abdominal pain. Hx of DM, essential HTn, back pain, mild dementia. Pt is from an ALF    PT Comments    Assisted pt OOB to amb to BR to void.  Assisted to amb back to bed.  Pt declined amb in hallway.  Follow Up Recommendations  No PT follow up (return to ALF)     Equipment Recommendations       Recommendations for Other Services       Precautions / Restrictions Precautions Precautions: Fall Restrictions Weight Bearing Restrictions: No    Mobility  Bed Mobility Overal bed mobility: Needs Assistance Bed Mobility: Supine to Sit     Supine to sit: Min assist     General bed mobility comments: Pt required light min A to lift shoulders from bed   Transfers Overall transfer level: Needs assistance Equipment used: Rolling walker (2 wheeled) Transfers: Sit to/from Stand Sit to Stand: Min assist         General transfer comment: Assist for balance / safety  Ambulation/Gait Ambulation/Gait assistance: Min guard Ambulation Distance (Feet): 14 Feet Assistive device: Rolling walker (2 wheeled) Gait Pattern/deviations: Step-to pattern;Step-through pattern;Decreased step length - right;Decreased step length - left;Decreased stride length;Trunk flexed Gait velocity: decreased   General Gait Details: close guard for safety. slow gait speed.  amb from bed to BR then from BR back to bed. encouraged amb in hallway, however pt declined.    Stairs            Wheelchair Mobility    Modified Rankin (Stroke Patients Only)       Balance                                    Cognition Arousal/Alertness: Awake/alert   Overall Cognitive Status: History of cognitive impairments - at baseline                      Exercises      General Comments         Pertinent Vitals/Pain Pain Assessment: No/denies pain    Home Living                      Prior Function            PT Goals (current goals can now be found in the care plan section) Progress towards PT goals: Progressing toward goals    Frequency  Min 3X/week    PT Plan      Co-evaluation             End of Session Equipment Utilized During Treatment: Gait belt Activity Tolerance: Patient tolerated treatment well Patient left: in bed;with call bell/phone within reach;with bed alarm set     Time: 1350-1406 PT Time Calculation (min) (ACUTE ONLY): 16 min  Charges:    1 gt                  G Codes:      Armando ReichertKropski, Jacolby Risby Ann 05/10/2015, 2:43 PM

## 2015-05-11 LAB — SODIUM
SODIUM: 130 mmol/L — AB (ref 135–145)
Sodium: 123 mmol/L — ABNORMAL LOW (ref 135–145)
Sodium: 127 mmol/L — ABNORMAL LOW (ref 135–145)

## 2015-05-11 LAB — BASIC METABOLIC PANEL
ANION GAP: 6 (ref 5–15)
BUN: 9 mg/dL (ref 6–20)
CALCIUM: 9.1 mg/dL (ref 8.9–10.3)
CO2: 23 mmol/L (ref 22–32)
CREATININE: 0.81 mg/dL (ref 0.44–1.00)
Chloride: 95 mmol/L — ABNORMAL LOW (ref 101–111)
GFR calc Af Amer: >60 mL/min (ref >60)
GFR calc non Af Amer: >60 mL/min (ref >60)
GLUCOSE: 91 mg/dL (ref 65–99)
Potassium: 4 mmol/L (ref 3.5–5.1)
SODIUM: 124 mmol/L — AB (ref 135–145)

## 2015-05-11 LAB — GLUCOSE, CAPILLARY
GLUCOSE-CAPILLARY: 92 mg/dL (ref 65–99)
Glucose-Capillary: 94 mg/dL (ref 65–99)
Glucose-Capillary: 79 mg/dL (ref 65–99)
Glucose-Capillary: 89 mg/dL (ref 65–99)

## 2015-05-11 MED ORDER — TOLVAPTAN 15 MG PO TABS
15.0000 mg | ORAL_TABLET | ORAL | Status: DC
  Administered 2015-05-11 – 2015-05-13 (×3): 15 mg via ORAL
  Filled 2015-05-11 (×4): qty 1

## 2015-05-11 NOTE — Progress Notes (Signed)
Occupational Therapy Treatment Patient Details Name: Jillian Wheeler MRN: [id number] DOB: Dec 02, 1925 Today's Date: 05/11/2015    History of present illness 81 yo female admitted with lower abdominal pain. Hx of DM, essential HTn, back pain, mild dementia. Pt is from an ALF   OT comments  Pt is making progress in OT. Will need support from ALF for adls and mobility.  Also recommend HHOT  Follow Up Recommendations  Home health OT;Supervision/Assistance - 24 hour (needs assistance for ADLs and mobility at ALF)    Equipment Recommendations  None recommended by OT (has riser)    Recommendations for Other Services      Precautions / Restrictions Precautions Precautions: Fall Restrictions Weight Bearing Restrictions: No       Mobility Bed Mobility         Supine to sit: Min assist     General bed mobility comments: support for trunk to sit up  Transfers   Equipment used: Rolling walker (2 wheeled) Transfers: Sit to/from Stand Sit to Stand: Min assist         General transfer comment: cues for UE placement; assist to rise and steady and control descent    Balance                                   ADL       Grooming: Wash/dry hands;Min Dispensing optician: Minimal assistance;Ambulation;Comfort height toilet;RW;Grab bars   Toileting- Clothing Manipulation and Hygiene: Minimal assistance;Total assistance (clothes min; hygiene total)         General ADL Comments: initially ambulated to bathroom to brush teeth.  Pt needed to use bathroom, and BSC was not within reach.  Assisted to comfort height commode with grab bars.  Pt asked OT to wipe her, but she assisted with managing underwear.  Did not want to brush teeth when sitting back in chair and she felt too weak to perform in standing after using the commode      Vision                     Perception     Praxis      Cognition     Overall Cognitive  Status: History of cognitive impairments - at baseline                       Extremity/Trunk Assessment               Exercises     Shoulder Instructions       General Comments      Pertinent Vitals/ Pain       Pain Assessment: No/denies pain  Home Living                                          Prior Functioning/Environment              Frequency Min 2X/week     Progress Toward Goals  OT Goals(current goals can now be found in the care plan section)  Progress towards OT goals: Progressing toward goals (one goal met; upgraded)  Acute Rehab OT Goals Time For Goal Achievement: 05/11/15 ADL Goals Pt Will Perform Grooming:  (  x 2 tasks)  Plan      Co-evaluation                 End of Session     Activity Tolerance Patient tolerated treatment well   Patient Left in chair;with call bell/phone within reach;with chair alarm set   Nurse Communication  (pt up in chair with alarm on; voided)        Time: 8588-5027 OT Time Calculation (min): 20 min  Charges: OT General Charges $OT Visit: 1 Procedure OT Treatments $Self Care/Home Management : 8-22 mins  Keerstin Bjelland 05/11/2015, 11:43 AM Lesle Chris, OTR/L [phone] 05/11/2015

## 2015-05-11 NOTE — Progress Notes (Signed)
Physical Therapy Treatment Patient Details Name: Jillian Wheeler MRN: [id number] DOB: 1925-10-09 Today's Date: 05/11/2015    History of Present Illness 81 yo female admitted with lower abdominal pain. Hx of DM, essential HTn, back pain, mild dementia. Pt is from an ALF    PT Comments    Pt agreeable to ambulate in hallway today however short distance due to fatigue.  Follow Up Recommendations  No PT follow up     Equipment Recommendations  None recommended by PT    Recommendations for Other Services       Precautions / Restrictions Precautions Precautions: Fall Restrictions Weight Bearing Restrictions: No    Mobility  Bed Mobility Overal bed mobility: Needs Assistance Bed Mobility: Supine to Sit;Sit to Supine     Supine to sit: Min assist Sit to supine: Mod assist   General bed mobility comments: assist for trunk upright and LEs onto bed  Transfers Overall transfer level: Needs assistance Equipment used: Rolling walker (2 wheeled) Transfers: Sit to/from Stand Sit to Stand: Min assist         General transfer comment: verbal cues for hand placement, assist for rise  Ambulation/Gait Ambulation/Gait assistance: Min guard Ambulation Distance (Feet): 50 Feet Assistive device: Rolling walker (2 wheeled) Gait Pattern/deviations: Step-through pattern;Decreased stride length;Trunk flexed Gait velocity: decreased   General Gait Details: pt reports mobilizing slowly, verbal cues for RW distance and posture, distance to pt tolerance   Stairs            Wheelchair Mobility    Modified Rankin (Stroke Patients Only)       Balance                                    Cognition Arousal/Alertness: Awake/alert Behavior During Therapy: WFL for tasks assessed/performed Overall Cognitive Status: History of cognitive impairments - at baseline                      Exercises      General Comments        Pertinent Vitals/Pain Pain  Assessment: No/denies pain    Home Living                      Prior Function            PT Goals (current goals can now be found in the care plan section) Progress towards PT goals: Progressing toward goals    Frequency  Min 3X/week    PT Plan Current plan remains appropriate    Co-evaluation             End of Session Equipment Utilized During Treatment: Gait belt Activity Tolerance: Patient tolerated treatment well Patient left: in bed;with call bell/phone within reach;with bed alarm set     Time: [id number] PT Time Calculation (min) (ACUTE ONLY): 17 min  Charges:  $Gait Training: 8-22 mins                    G Codes:      Paulett Kaufhold,KATHrine E 05/11/2015, 12:29 PM Zenovia JarredKati Shriyan Arakawa, PT, DPT 05/11/2015 Pager: [phone]

## 2015-05-11 NOTE — Progress Notes (Signed)
Progress Note   Jillian Wheeler ZOX:[id number] DOB: 06-04-1925 DOA: 05/06/2015 PCP: Hoyle SauerAVVA,RAVISANKAR R, MD   Brief Narrative:   Jillian Riggstta C Ueda is an 81 y.o. female resident of [facility], with history of diet-controlled DM 2, stage III CKD, B12 deficiency, essential hypertension, constipation, prior falls, possible mild dementia, GERD, hypothyroid, chronic diastolic CHF, hospitalization in March 2016 for multifactorial hyponatremia, who was admitted 05/06/15 with complaints of abdominal pain. In the ED, patient was hemodynamically stable, ultrasound and CT of abdomen did not show acute findings. Lab work significant for sodium of 118, potassium 3.1 and hemoglobin 11.4. Urine microscopy not impressive for UTI.   Assessment/Plan:   Principal Problem:  Klebsiella pneumonia UTI/Lower abdominal pain - This is subacute/acute on chronic intermittent pain likely multifactorial with hyponatremia and UTI contributory. - Clinical exam and imaging studies (CT and ultrasound abdomen) without acute findings. - ? Functional related to constipation/? IBS (on Linzess) versus Klebsiella UTI. Continue Rocephin for 3 days total. - Currently pain controlled with medications. - Continue bowel regimen. Not on PPI.  - Abdominal pain seems to be improved with correction of sodium and treatment of UTI.  Active Problems:   Anorexia - Marinol started.  Consider dosage increase if no improvement by 05/12/15.     Hyponatremia secondary to SIADH - Likely subacute on chronic.  TSH recently was normal. - Urine osmolality/serum osmolality both low. Urine sodium 71.  Findings consistent with SIADH. - Sodium still low despite demeclocycline, which was started 05/08/15. - Discontinue demeclocycline and start Samsca, check sodium Q 6 hours.   MRSA carrier - Contact precautions/decontamination therapy ordered.    Hypokalemia - Continue to supplement, potassium WNL.     Hyperlipidemia - Continue statin.    DIASTOLIC HEART FAILURE, CHRONIC - Compensated.   GERD - Not currently on PPI.   Anemia - Chronic and stable. - Follow CBCs.  Diet-controlled DM 2 - Discontinue CBG monitoring, CBGs have been well-controlled: 92-109.  Cholelithiasis without cholecystitis - Seen on imaging.   L5 compression fracture status post augmentation - Stable.  Chronic constipation - Continue Linzess, MiraLAX and senna.  History of falls - Last fall was approximately 3-4 weeks ago. - PT/OT evaluations performed. No PT follow-up recommended.   Code Status: DNR  Family Communication: Jillian LodgeJuanita /POA by telephone.  [facility], possibly in 1-2 days if sodium improved.    IV Access:    Peripheral IV   Procedures and diagnostic studies:   [place] Abdomen Complete  05/06/2015   CLINICAL DATA:  Abdominal pain  EXAM: ULTRASOUND ABDOMEN COMPLETE  COMPARISON:  05/06/2015  FINDINGS: Gallbladder: Single 1.4 cm gallstone is identified. No significant wall thickening or pericholecystic fluid is noted. A negative sonographic Eulah PontMurphy sign is noted.  Common bile duct: Diameter: 3 mm.  Liver: No focal lesion identified. Within normal limits in parenchymal echogenicity.  IVC: No abnormality visualized.  Pancreas: Not well visualized due to overlying bowel gas.  Spleen: Size and appearance within normal limits.  Right Kidney: Length: 10.4 cm. Echogenicity within normal limits. No mass or hydronephrosis visualized.  Left Kidney: Length: 10.2 cm. Echogenicity within normal limits. No mass or hydronephrosis visualized.  Abdominal aorta: No aneurysm visualized.  Other findings: None.  IMPRESSION: Cholelithiasis without significant wall thickening or pericholecystic fluid.  No other focal abnormality is noted.   Electronically Signed   By: Alcide CleverMark  Lukens M.D.   On: 05/06/2015 15:30   Ct Abdomen Pelvis W Contrast  05/06/2015   CLINICAL DATA:  Lower abdominal pain.  Nausea since  yesterday.  EXAM: CT ABDOMEN AND PELVIS WITH CONTRAST  TECHNIQUE: Multidetector CT imaging of the abdomen and pelvis was performed using the standard protocol following bolus administration of intravenous contrast.  CONTRAST:  50mL OMNIPAQUE IOHEXOL 300 MG/ML SOLN, OMNIPAQUE IOHEXOL 300 MG/ML SOLN  COMPARISON:  12/23/2014  FINDINGS: The lung bases are clear.  The liver demonstrates no focal abnormality. There is no intrahepatic or extrahepatic biliary ductal dilatation. Mild pericholecystic fluid and mild gallbladder wall thickening. The spleen demonstrates no focal abnormality. The pancreas is normal. There is a 16 mm hypodense, fluid attenuating left interpolar renal mass most consistent with a cyst. There is a 4 mm hypodense lesion in the right interpolar aspect of the kidney too small to characterize. There is no obstructive uropathy. The adrenal glands are normal. The bladder is unremarkable.  The stomach, duodenum, small intestine, and large intestine demonstrate no contrast extravasation or dilatation. There is a moderate size hiatal hernia. There is a small fat containing umbilical hernia. There is no pneumoperitoneum, pneumatosis, or portal venous gas. There is no abdominal or pelvic free fluid. There is no lymphadenopathy.  The abdominal aorta is normal in caliber with atherosclerosis.  There are no lytic or sclerotic osseous lesions. There is a chronic L5 vertebral body compression fracture status post augmentation. There is methylmethacrylate within the L5 vertebral body with extrusion of methylmethacrylate into the spinal canal along the right paracentral aspect and extending into the right neural foramen with overall right lateral recess stenosis at L5-S1 and right foraminal stenosis. There is bilateral facet arthropathy throughout the lumbar spine. There is grade 1 anterolisthesis of L4 on L5. There is bilateral foraminal stenosis at L4-5.  IMPRESSION: 1. Mild gallbladder wall thickening and  pericholecystic fluid as can be seen with acute cholecystitis. Correlate with clinical exam. If there is further clinical concern, recommend a right upper quadrant ultrasound. 2. There is a chronic L5 vertebral body compression fracture status post augmentation. There is methylmethacrylate within the L5 vertebral body with extrusion of methylmethacrylate into the spinal canal along the right paracentral aspect and extending into the right neural foramen with overall right lateral recess stenosis at L5-S1 and right foraminal stenosis.   Electronically Signed   By: Elige Ko   On: 05/06/2015 14:08     Medical Consultants:    None.  Anti-Infectives:    Rocephin 05/09/15---> 05/11/15  Subjective:   Jillian Wheeler says her appetite has not improved yet despite Marinol. No nausea or vomiting.  Abdominal pain improved.  Got up with physical therapy.    Objective:    Filed Vitals:   05/10/15 1512 05/10/15 2119 05/11/15 0453 05/11/15 0535  BP: 124/53 154/55 165/72   Pulse: 76 80 70   Temp: 97.9 F (36.6 C) 97.7 F (36.5 C) 97.6 F (36.4 C)   TempSrc: Axillary Oral Oral   Resp: Height:      Weight:    69.854 kg (154 lb)  SpO2: 100% 100% 99%     Intake/Output Summary (Last 24 hours) at 05/11/15 0818 Last data filed at 05/10/15 1830  Gross per 24 hour  Intake 1362.5 ml  Output   1400 ml  Net  -37.5 ml    Exam: Gen:  NAD Cardiovascular:  RRR, No M/R/G Respiratory:  Lungs CTAB Gastrointestinal:  Abdomen soft, NT/ND, + BS Extremities:  No C/E/C   Data Reviewed:  Labs: Basic Metabolic Panel:  Recent Labs Lab 05/07/15 0511 05/08/15 0501 05/09/15 0510 05/10/15 0540 05/11/15 0413  NA 120* 119* 121* 124* 124*  K 4.3 3.5 3.2* 3.7 4.0  CL 88* 87* 91* 95* 95*  CO2 21* 23  GLUCOSE 91 105* 92 84 91  BUN CREATININE 0.79 0.74 0.70 0.80 0.81  CALCIUM 8.9 8.8* 8.5* 8.8* 9.1   GFR Estimated Creatinine Clearance: 44 mL/min (by C-G formula  based on Cr of 0.81). Liver Function Tests:  Recent Labs Lab 05/06/15 1230  AST 23  ALT 20  ALKPHOS 85  BILITOT 0.6  PROT 6.7  ALBUMIN 3.6   CBC:  Recent Labs Lab 05/06/15 1230 05/07/15 0511  WBC 10.2 11.5*  HGB 11.4* 10.6*  HCT 31.6* 29.6*  MCV 96.9 99.0  PLT 338 290   CBG:  Recent Labs Lab 05/10/15 1720 05/10/15 2021 05/11/15 0102 05/11/15 0451 05/11/15 0748  GLUCAP 92 109* 94 92 79    Microbiology Recent Results (from the past 240 hour(s))  Urine culture     Status: None   Collection Time: 05/06/15  4:19 PM  Result Value Ref Range Status   Specimen Description URINE, RANDOM  Final   Special Requests NONE  Final   Colony Count   Final    45,000 COLONIES/ML Performed at [facility]    Culture   Final    KLEBSIELLA PNEUMONIAE Performed at [facility]    Report Status 05/08/2015 FINAL  Final   Organism ID, Bacteria KLEBSIELLA PNEUMONIAE  Final      Susceptibility   Klebsiella pneumoniae - MIC*    AMPICILLIN >=32 RESISTANT Resistant     CEFAZOLIN <=4 SENSITIVE Sensitive     CEFTRIAXONE <=1 SENSITIVE Sensitive     CIPROFLOXACIN <=0.25 SENSITIVE Sensitive     GENTAMICIN <=1 SENSITIVE Sensitive     LEVOFLOXACIN <=0.12 SENSITIVE Sensitive     NITROFURANTOIN 32 SENSITIVE Sensitive     TOBRAMYCIN <=1 SENSITIVE Sensitive     TRIMETH/SULFA <=20 SENSITIVE Sensitive     PIP/TAZO <=4 SENSITIVE Sensitive     * KLEBSIELLA PNEUMONIAE  MRSA PCR Screening     Status: Abnormal   Collection Time: 05/07/15 12:30 AM  Result Value Ref Range Status   MRSA by PCR POSITIVE (A) NEGATIVE Final    Comment:        The GeneXpert MRSA Assay (FDA approved for NASAL specimens only), is one component of a comprehensive MRSA colonization surveillance program. It is not intended to diagnose MRSA infection nor to guide or monitor treatment for MRSA infections. RESULT CALLED TO, READ BACK BY AND VERIFIED WITH: L REINERT @ 0428 ON 05/07/15 BY C DAVIS       Medications:   . allopurinol  100 mg Oral Daily  . amLODipine  5 mg Oral Daily  . atorvastatin  20 mg Oral Daily  . calcium-vitamin D  1 tablet Oral Daily  . cefTRIAXone (ROCEPHIN)  IV  1 g Intravenous Daily  . dronabinol  2.5 mg Oral BID AC  . enoxaparin (LOVENOX) injection  40 mg Subcutaneous Q24H  . levothyroxine  88 mcg Oral QAC breakfast  . Linaclotide  145 mcg Oral Daily  . multivitamin   Oral 2 times per day  . multivitamin with minerals  1 tablet Oral Daily  . mupirocin ointment  1 application Nasal BID  . omega-3 acid ethyl esters  1,000 mg Oral Daily  .  OxyCODONE  10 mg Oral Q12H  . polyethylene glycol  17 g Oral Daily  . potassium chloride  20 mEq Oral BID  . senna  1 tablet Oral BID  . sodium chloride  3 mL Intravenous Q12H  . tolvaptan  15 mg Oral Q24H   Continuous Infusions: . sodium chloride 50 mL/hr at 05/10/15 2251    Time spent: 25 minutes.   LOS: 5 days   RAMA,CHRISTINA  [facility] Pager [phone]. If unable to reach me by pager, please call my cell phone at [phone].  *Please refer to [email or web address], password [email or web address] to get updated schedule on who will round on this patient, as hospitalists switch teams weekly. If 7PM-7AM, please contact night-coverage at [email or web address], password [email or web address] for any overnight needs.  05/11/2015, 8:18 AM

## 2015-05-11 NOTE — Care Management Note (Signed)
Case Management Note  Patient Details  Name: Jillian Wheeler MRN: [id number] Date of Birth: 16-Nov-1925  Subjective/Objective:   Pt admitted with cco lower abd pain                 Action/Plan: from ALF   Expected Discharge Date:   (unknown)               Expected Discharge Plan:  Assisted Living / Rest Home  In-House Referral:  Clinical Social Work  Discharge planning Services  CM Consult  Post Acute Care Choice:    Choice offered to:     DME Arranged:    DME Agency:     HH Arranged:    HH Agency:     Status of Service:  In process, will continue to follow  Medicare Important Message Given:    Date Medicare IM Given:  05/11/15 Medicare IM give by:  Geni BersMyraette Harinder Romas, RN  Date Additional Medicare IM Given:    Additional Medicare Important Message give by:     If discussed at Long Length of Stay Meetings, dates discussed:    Additional CommentsGeni Bers:  [name], RN 05/11/2015, 3:03 PM

## 2015-05-12 ENCOUNTER — Ambulatory Visit: Payer: Commercial Managed Care - HMO | Admitting: Podiatrist

## 2015-05-12 LAB — SODIUM
SODIUM: 133 mmol/L — AB (ref 135–145)
Sodium: 135 mmol/L (ref 135–145)
Sodium: 134 mmol/L — ABNORMAL LOW (ref 135–145)
Sodium: 133 mmol/L — ABNORMAL LOW (ref 135–145)

## 2015-05-12 NOTE — Progress Notes (Signed)
Occupational Therapy Treatment Patient Details Name: Nilda Riggstta C Liera MRN: [id number] DOB: 1925/01/09 Today's Date: 05/12/2015    History of present illness 81 yo female admitted with lower abdominal pain. Hx of DM, essential HTn, back pain, mild dementia. Pt is from an ALF   OT comments  Pt continues to be limited by endurance.  Will need assistance at ALF with mobility and ADLs.  Recommend continued OT  Follow Up Recommendations  Home health OT;Supervision/Assistance - 24 hour (assistance for adls and mobility will be needed at ALF)    Equipment Recommendations  None recommended by OT    Recommendations for Other Services      Precautions / Restrictions Precautions Precautions: Fall Restrictions Weight Bearing Restrictions: No       Mobility Bed Mobility         Supine to sit: Min assist Sit to supine: Mod assist   General bed mobility comments: min A for trunk to get up ; mod A for legs back to bed  Transfers   Equipment used: Rolling walker (2 wheeled) Transfers: Sit to/from Stand Sit to Stand: Min assist         General transfer comment: light min A for boosting up and steadying    Balance                                   ADL       Grooming: Oral care;Wash/dry hands;Sitting;Set up                   Toilet Transfer: Minimal assistance;Ambulation;RW;BSC   Toileting- Clothing Manipulation and Hygiene: Minimal assistance;Sit to/from stand         General ADL Comments: pt too fatiqued to stand at sink for grooming.  steadied pt while she performed hygiene and clothing management      Vision                     Perception     Praxis      Cognition   Behavior During Therapy: WFL for tasks assessed/performed Overall Cognitive Status: History of cognitive impairments - at baseline                       Extremity/Trunk Assessment               Exercises     Shoulder Instructions       General  Comments      Pertinent Vitals/ Pain       Pain Assessment: No/denies pain  Home Living                                          Prior Functioning/Environment              Frequency Min 2X/week     Progress Toward Goals  OT Goals(current goals can now be found in the care plan section)  Progress towards OT goals: Progressing toward goals     Plan      Co-evaluation                 End of Session     Activity Tolerance Patient limited by fatigue   Patient Left in bed;with call bell/phone within reach;with bed alarm set   Nurse Communication  Time: 1610-9604 OT Time Calculation (min): 22 min  Charges: OT General Charges $OT Visit: 1 Procedure OT Treatments $Self Care/Home Management : 8-22 mins  Ceola Para 05/12/2015, 4:28 PM   Marica Otter, OTR/L [phone] 05/12/2015

## 2015-05-12 NOTE — Progress Notes (Signed)
Progress Note   Jillian Wheeler:[id number] DOB: 04/29/1925 DOA: 05/06/2015 PCP: Hoyle Sauer, MD   Brief Narrative:   Jillian Wheeler is an 81 y.o. female resident of [facility], with history of diet-controlled DM 2, stage III CKD, B12 deficiency, essential hypertension, constipation, prior falls, possible mild dementia, GERD, hypothyroid, chronic diastolic CHF, hospitalization in March 2016 for multifactorial hyponatremia, who was admitted 05/06/15 with complaints of abdominal pain. In the ED, patient was hemodynamically stable, ultrasound and CT of abdomen did not show acute findings. Lab work significant for sodium of 118, potassium 3.1 and hemoglobin 11.4. Urine microscopy not impressive for UTI.   Assessment/Plan:   Principal Problem:  Klebsiella pneumonia UTI/Lower abdominal pain - This is subacute/acute on chronic intermittent pain likely multifactorial with hyponatremia and UTI contributory. - Clinical exam and imaging studies (CT and ultrasound abdomen) without acute findings. - ? Functional related to constipation/? IBS (on Linzess) versus Klebsiella UTI. Continue Rocephin to complete the course.  - Currently pain controlled with medications. - Continue bowel regimen. Not on PPI.  - Abdominal pain seems to be improved with correction of sodium and treatment of UTI.  Active Problems:   Anorexia - Marinol started.  Consider dosage increase if no improvement by 05/12/15.     Hyponatremia secondary to SIADH - Likely subacute on chronic.  TSH recently was normal. - Urine osmolality/serum osmolality both low. Urine sodium 71.  Findings consistent with SIADH. - Sodium still low despite demeclocycline, which was started 05/08/15. - Discontinue demeclocycline and started Samsca, repeat sodium is much better.    MRSA carrier - Contact precautions/decontamination therapy ordered.    Hypokalemia - Continue to supplement, potassium WNL.     Hyperlipidemia - Continue  statin.   DIASTOLIC HEART FAILURE, CHRONIC - Compensated.   GERD - Not currently on PPI.   Anemia - Chronic and stable. - Follow CBCs.  Diet-controlled DM 2 - Discontinue CBG monitoring, CBGs have been well-controlled: 92-109. CBG (last 3)   Recent Labs  05/11/15 0451 05/11/15 0748 05/11/15 1216  GLUCAP 92 79 89      Cholelithiasis without cholecystitis - Seen on imaging.   L5 compression fracture status post augmentation - Stable.  Chronic constipation - Continue Linzess, MiraLAX and senna.  History of falls - Last fall was approximately 3-4 weeks ago. - PT/OT evaluations performed. No PT follow-up recommended.   Code Status: DNR  Family Communication: discussed with daughter att bedside.  Disposition Plan: D/C to [facility] when medically stable, possibly in 1-2 days if sodium improved.    IV Access:    Peripheral IV   Procedures and diagnostic studies:   US Abdomen Complete  05/06/2015   CLINICAL DATA:  Abdominal pain  EXAM: ULTRASOUND ABDOMEN COMPLETE  COMPARISON:  05/06/2015  FINDINGS: Gallbladder: Single 1.4 cm gallstone is identified. No significant wall thickening or pericholecystic fluid is noted. A negative sonographic Eulah Pont sign is noted.  Common bile duct: Diameter: 3 mm.  Liver: No focal lesion identified. Within normal limits in parenchymal echogenicity.  IVC: No abnormality visualized.  Pancreas: Not well visualized due to overlying bowel gas.  Spleen: Size and appearance within normal limits.  Right Kidney: Length: 10.4 cm. Echogenicity within normal limits. No mass or hydronephrosis visualized.  Left Kidney: Length: 10.2 cm. Echogenicity within normal limits. No mass or hydronephrosis visualized.  Abdominal aorta: No aneurysm visualized.  Other findings: None.  IMPRESSION: Cholelithiasis without significant wall thickening or pericholecystic fluid.  No other focal  abnormality is noted.   Electronically Signed   By: Alcide Clever M.D.    On: 05/06/2015 15:30   Ct Abdomen Pelvis W Contrast  05/06/2015   CLINICAL DATA:  Lower abdominal pain.  Nausea since yesterday.  EXAM: CT ABDOMEN AND PELVIS WITH CONTRAST  TECHNIQUE: Multidetector CT imaging of the abdomen and pelvis was performed using the standard protocol following bolus administration of intravenous contrast.  CONTRAST:  50mL OMNIPAQUE IOHEXOL 300 MG/ML SOLN, OMNIPAQUE IOHEXOL 300 MG/ML SOLN  COMPARISON:  12/23/2014  FINDINGS: The lung bases are clear.  The liver demonstrates no focal abnormality. There is no intrahepatic or extrahepatic biliary ductal dilatation. Mild pericholecystic fluid and mild gallbladder wall thickening. The spleen demonstrates no focal abnormality. The pancreas is normal. There is a 16 mm hypodense, fluid attenuating left interpolar renal mass most consistent with a cyst. There is a 4 mm hypodense lesion in the right interpolar aspect of the kidney too small to characterize. There is no obstructive uropathy. The adrenal glands are normal. The bladder is unremarkable.  The stomach, duodenum, small intestine, and large intestine demonstrate no contrast extravasation or dilatation. There is a moderate size hiatal hernia. There is a small fat containing umbilical hernia. There is no pneumoperitoneum, pneumatosis, or portal venous gas. There is no abdominal or pelvic free fluid. There is no lymphadenopathy.  The abdominal aorta is normal in caliber with atherosclerosis.  There are no lytic or sclerotic osseous lesions. There is a chronic L5 vertebral body compression fracture status post augmentation. There is methylmethacrylate within the L5 vertebral body with extrusion of methylmethacrylate into the spinal canal along the right paracentral aspect and extending into the right neural foramen with overall right lateral recess stenosis at L5-S1 and right foraminal stenosis. There is bilateral facet arthropathy throughout the lumbar spine. There is grade 1  anterolisthesis of L4 on L5. There is bilateral foraminal stenosis at L4-5.  IMPRESSION: 1. Mild gallbladder wall thickening and pericholecystic fluid as can be seen with acute cholecystitis. Correlate with clinical exam. If there is further clinical concern, recommend a right upper quadrant ultrasound. 2. There is a chronic L5 vertebral body compression fracture status post augmentation. There is methylmethacrylate within the L5 vertebral body with extrusion of methylmethacrylate into the spinal canal along the right paracentral aspect and extending into the right neural foramen with overall right lateral recess stenosis at L5-S1 and right foraminal stenosis.   Electronically Signed   By: Elige Ko   On: 05/06/2015 14:08     Medical Consultants:    None.  Anti-Infectives:    Rocephin 05/09/15---> 05/11/15  Subjective:   Nilda Riggs says she doesn t have abdominal pain.   Objective:    Filed Vitals:   05/11/15 1541 05/11/15 2224 05/12/15 0438 05/12/15 1307  BP: 143/53 155/58 140/63 133/69  Pulse: 72 87 76 84  Temp: 97.5 F (36.4 C) 97.4 F (36.3 C) 97.3 F (36.3 C) 97.1 F (36.2 C)  TempSrc:  Oral Oral Oral  Resp: Height:      Weight:   67.677 kg (149 lb 3.2 oz)   SpO2: 99% 98% 99% 100%    Intake/Output Summary (Last 24 hours) at 05/12/15 1558 Last data filed at 05/12/15 1308  Gross per 24 hour  Intake    390 ml  Output   4900 ml  Net  -4510 ml    Exam: Gen:  NAD Cardiovascular:  RRR, No M/R/G Respiratory:  Lungs  CTAB Gastrointestinal:  Abdomen soft, NT/ND, + BS Extremities:  No C/E/C   Data Reviewed:    Labs: Basic Metabolic Panel:  Recent Labs Lab 05/07/15 0511 05/08/15 0501 05/09/15 0510 05/10/15 0540 05/11/15 0413 05/11/15 0904 05/11/15 1520 05/11/15 2010 05/12/15 0443 05/12/15 1055  NA 120* 119* 121* 124* 124* 123* 127* 130* 133* 135  K 4.3 3.5 3.2* 3.7 4.0  --   --   --   --   --   CL 88* 87* 91* 95* 95*  --   --   --   --    --   CO2 24 23 22  21* 23  --   --   --   --   --   GLUCOSE 91 105* 92 84 91  --   --   --   --   --   BUN 12 9 9 10 9   --   --   --   --   --   CREATININE 0.79 0.74 0.70 0.80 0.81  --   --   --   --   --   CALCIUM 8.9 8.8* 8.5* 8.8* 9.1  --   --   --   --   --    GFR Estimated Creatinine Clearance: 43.3 mL/min (by C-G formula based on Cr of 0.81). Liver Function Tests:  Recent Labs Lab 05/06/15 1230  AST 23  ALT 20  ALKPHOS 85  BILITOT 0.6  PROT 6.7  ALBUMIN 3.6   CBC:  Recent Labs Lab 05/06/15 1230 05/07/15 0511  WBC 10.2 11.5*  HGB 11.4* 10.6*  HCT 31.6* 29.6*  MCV 96.9 99.0  PLT 338 290   CBG:  Recent Labs Lab 05/10/15 2021 05/11/15 0102 05/11/15 0451 05/11/15 0748 05/11/15 1216  GLUCAP 109* 94 92 79 89    Microbiology Recent Results (from the past 240 hour(s))  Urine culture     Status: None   Collection Time: 05/06/15  4:19 PM  Result Value Ref Range Status   Specimen Description URINE, RANDOM  Final   Special Requests NONE  Final   Colony Count   Final    45,000 COLONIES/ML Performed at [facility]    Culture   Final    KLEBSIELLA PNEUMONIAE Performed at [facility]    Report Status 05/08/2015 FINAL  Final   Organism ID, Bacteria KLEBSIELLA PNEUMONIAE  Final      Susceptibility   Klebsiella pneumoniae - MIC*    AMPICILLIN >=32 RESISTANT Resistant     CEFAZOLIN <=4 SENSITIVE Sensitive     CEFTRIAXONE <=1 SENSITIVE Sensitive     CIPROFLOXACIN <=0.25 SENSITIVE Sensitive     GENTAMICIN <=1 SENSITIVE Sensitive     LEVOFLOXACIN <=0.12 SENSITIVE Sensitive     NITROFURANTOIN 32 SENSITIVE Sensitive     TOBRAMYCIN <=1 SENSITIVE Sensitive     TRIMETH/SULFA <=20 SENSITIVE Sensitive     PIP/TAZO <=4 SENSITIVE Sensitive     * KLEBSIELLA PNEUMONIAE  MRSA PCR Screening     Status: Abnormal   Collection Time: 05/07/15 12:30 AM  Result Value Ref Range Status   MRSA by PCR POSITIVE (A) NEGATIVE Final    Comment:        The  GeneXpert MRSA Assay (FDA approved for NASAL specimens only), is one component of a comprehensive MRSA colonization surveillance program. It is not intended to diagnose MRSA infection nor to guide or monitor treatment for MRSA infections. RESULT CALLED TO, READ BACK BY AND VERIFIED  WITH: L REINERT @ 0428 ON 05/07/15 BY C DAVIS      Medications:   . allopurinol  100 mg Oral Daily  . amLODipine  5 mg Oral Daily  . atorvastatin  20 mg Oral Daily  . calcium-vitamin D  1 tablet Oral Daily  . dronabinol  2.5 mg Oral BID AC  . enoxaparin (LOVENOX) injection  40 mg Subcutaneous Q24H  . levothyroxine  88 mcg Oral QAC breakfast  . Linaclotide  145 mcg Oral Daily  . multivitamin   Oral 2 times per day  . multivitamin with minerals  1 tablet Oral Daily  . omega-3 acid ethyl esters  1,000 mg Oral Daily  . OxyCODONE  10 mg Oral Q12H  . polyethylene glycol  17 g Oral Daily  . potassium chloride  20 mEq Oral BID  . senna  1 tablet Oral BID  . sodium chloride  3 mL Intravenous Q12H  . tolvaptan  15 mg Oral Q24H   Continuous Infusions: . sodium chloride 50 mL/hr at 05/12/15 1205    Time spent: 25 minutes.   LOS: 6 days   [facility] LPKULA,Hadas Jessop  [facility] Pager [phone]   If 7PM-7AM, please contact night-coverage at [email or web address], password [email or web address] for any overnight needs.  05/12/2015, 3:58 PM

## 2015-05-12 NOTE — Progress Notes (Signed)
CSW following [facility] medically [facility]. CSW has completed FL2 & will continue to follow and assist with return. CSW confirmed with Shawna at ALF that they would be able to take patient back. Anticipating discharge tomorrow, Thursday 5/26.    Lincoln MaxinKelly Wileen Duncanson, LCSW [facility] Clinical Social Worker cell #: [phone]

## 2015-05-13 DIAGNOSIS — I5032 Chronic diastolic (congestive) heart failure: Secondary | ICD-10-CM

## 2015-05-13 LAB — CREATININE, SERUM
CREATININE: 0.97 mg/dL (ref 0.44–1.00)
GFR calc non Af Amer: 51 mL/min — ABNORMAL LOW (ref >60)
GFR, EST AFRICAN AMERICAN: 59 mL/min — AB (ref >60)

## 2015-05-13 LAB — SODIUM
SODIUM: 134 mmol/L — AB (ref 135–145)
Sodium: 135 mmol/L (ref 135–145)

## 2015-05-13 MED ORDER — HYDROCODONE-ACETAMINOPHEN 5-325 MG PO TABS: 1.0000 | ORAL_TABLET | ORAL | Status: DC | PRN

## 2015-05-13 MED ORDER — ALBUTEROL SULFATE (2.5 MG/3ML) 0.083% IN NEBU: 2.5000 mg | INHALATION_SOLUTION | RESPIRATORY_TRACT | Status: AC | PRN

## 2015-05-13 MED ORDER — TRAMADOL HCL 50 MG PO TABS: 50.0000 mg | ORAL_TABLET | Freq: Two times a day (BID) | ORAL | Status: DC

## 2015-05-13 MED ORDER — TRAMADOL HCL 50 MG PO TABS: 50.0000 mg | ORAL_TABLET | Freq: Three times a day (TID) | ORAL | Status: DC

## 2015-05-13 MED ORDER — TRAMADOL-ACETAMINOPHEN 37.5-325 MG PO TABS
1.0000 | ORAL_TABLET | Freq: Four times a day (QID) | ORAL | Status: DC | PRN
Start: 2015-05-13 — End: 2015-09-21

## 2015-05-13 MED ORDER — SODIUM CHLORIDE 1 G PO TABS: 1.0000 g | ORAL_TABLET | Freq: Three times a day (TID) | ORAL | Status: DC

## 2015-05-13 NOTE — Discharge Summary (Signed)
Physician Discharge Summary  Jillian Wheeler ZOX:[id number] DOB: Sep 27, 1925 DOA: 05/06/2015  PCP: Hoyle Sauer, MD  Admit date: 05/06/2015 Discharge date: 05/13/2015  Time spent: 30 minutes  Recommendations for Outpatient Follow-up:  Follow up with PCP in one week.  Please check BMP in 3 days to check sodium level and follow up after that.   Discharge Diagnoses:  Principal Problem:   Lower abdominal pain Active Problems:   Hyperlipidemia   DIASTOLIC HEART FAILURE, CHRONIC   GERD   Hyponatremia   Anemia   Hypokalemia   Abdominal pain   MRSA carrier   SIADH (syndrome of inappropriate ADH production)   Discharge Condition: improved  Diet recommendation: regular diet, fluid restriction to 1.5 liters.   Filed Weights   05/11/15 0535 05/12/15 0438 05/13/15 0412  Weight: 69.854 kg (154 lb) 67.677 kg (149 lb 3.2 oz) 67.495 kg (148 lb 12.8 oz)    History of present illness:   Jillian Wheeler is an 81 y.o. female resident of [facility], with history of diet-controlled DM 2, stage III CKD, B12 deficiency, essential hypertension, constipation, prior falls, possible mild dementia, GERD, hypothyroid, chronic diastolic CHF, hospitalization in March 2016 for multifactorial hyponatremia, who was admitted 05/06/15 with complaints of abdominal pain. In the ED, patient was hemodynamically stable, ultrasound and CT of abdomen did not show acute findings. Lab work significant for sodium of 118, potassium 3.1 and hemoglobin 11.4. Urine microscopy not impressive for UTI.   Hospital Course:  Klebsiella pneumonia UTI/Lower abdominal pain - This is subacute/acute on chronic intermittent pain likely multifactorial with hyponatremia and UTI contributory. - Clinical exam and imaging studies (CT and ultrasound abdomen) without acute findings. - ? Functional related to constipation/? IBS (on Linzess) versus Klebsiella UTI.  Rocephin to complete the course.  - Currently pain controlled with  medications. - Continue bowel regimen. Not on PPI.  - Abdominal pain seems to be improved with correction of sodium and treatment of UTI.    Anorexia Improved.    Hyponatremia secondary to SIADH - Likely subacute on chronic. TSH recently was normal. - Urine osmolality/serum osmolality both low. Urine sodium 71. Findings consistent with SIADH. - Sodium still low despite demeclocycline, which was started 05/08/15. - Discontinued demeclocycline and started Samsca, repeat sodium is much better. D/c samsca as her sodium is 135. Started on salt tablets. And continue with fluid restriction to 1.5 liters per day.    MRSA carrier - Contact precautions/decontamination therapy ordered.   Hypokalemia - Continue to supplement, potassium WNL.    Hyperlipidemia - Continue statin.   DIASTOLIC HEART FAILURE, CHRONIC - Compensated.   GERD - Not currently on PPI.   Anemia - Chronic and stable.   Diet-controlled DM 2 - Discontinue CBG monitoring, CBGs have been well-controlled: 92-109. CBG (last 3)   Recent Labs (last 2 labs)      Recent Labs  05/11/15 0451 05/11/15 0748 05/11/15 1216  GLUCAP 92 79 89        Cholelithiasis without cholecystitis - Seen on imaging.  L5 compression fracture status post augmentation - Stable.continue with tramadol.   Chronic constipation - Continue Linzess, MiraLAX and senna.       Procedures:  none  Consultations:  none  Discharge Exam: Filed Vitals:   05/13/15 0412  BP: 152/85  Pulse: 85  Temp: 97.3 F (36.3 C)  Resp: 18    General: alert afebrile comfortable Cardiovascular: s1s2 Respiratory: ctab  Discharge Instructions   Discharge Instructions    Discharge instructions  Complete by:  As directed   Follow up with PCP in one week.  Please check BMP in 3 days and follow up the sodium level.          Current Discharge Medication List    START taking these medications   Details   albuterol (PROVENTIL) (2.5 MG/3ML) 0.083% nebulizer solution Take 3 mLs (2.5 mg total) by nebulization every 2 (two) hours as needed for wheezing. Qty: 75 mL, Refills: 12    sodium chloride 1 G tablet Take 1 tablet (1 g total) by mouth 3 (three) times daily with meals. Qty: 60 tablet, Refills: 0    traMADol (ULTRAM) 50 MG tablet Take 1 tablet (50 mg total) by mouth 3 (three) times daily. Qty: 15 tablet, Refills: 0      CONTINUE these medications which have CHANGED   Details  traMADol-acetaminophen (ULTRACET) 37.5-325 MG per tablet Take 1 tablet by mouth every 6 (six) hours as needed for moderate pain or severe pain. And 1 tablet by mouth nightly at 8pm Qty: 10 tablet, Refills: 0      CONTINUE these medications which have NOT CHANGED   Details  acetaminophen (TYLENOL) 325 MG tablet Take 325 mg by mouth at bedtime. To be taken with Tramadol    allopurinol (ZYLOPRIM) 100 MG tablet Take 100 mg by mouth daily.     amLODipine (NORVASC) 5 MG tablet Take 1 tablet (5 mg total) by mouth daily. Qty: 30 tablet, Refills: 1    atorvastatin (LIPITOR) 20 MG tablet Take 20 mg by mouth daily.     Calcium Carbonate-Vitamin D (CALTRATE 600+D) 600-400 MG-UNIT per tablet Take 1 tablet by mouth daily. At lunch and supper    clonazePAM (KLONOPIN) 1 MG tablet 2 by mouth every morning , 1 by mouth every night at bedtime Qty: 30 tablet, Refills: 5    cyanocobalamin (,VITAMIN B-12,) 1000 MCG/ML injection Inject 1,000 mcg into the muscle every 30 (thirty) days.    levothyroxine (SYNTHROID, LEVOTHROID) 88 MCG tablet Take 88 mcg by mouth daily before breakfast.    Linaclotide (LINZESS) 145 MCG CAPS capsule Take 145 mcg by mouth daily.    Melatonin 5 MG CAPS Take 5 mg by mouth at bedtime.     Multiple Vitamins-Minerals (CERTAVITE/ANTIOXIDANTS) TABS Take 1 tablet by mouth daily.    Multiple Vitamins-Minerals (PRESERVISION AREDS 2) CAPS Take 1 capsule by mouth 2 (two) times daily. 12 noon and 8 pm     Omega-3-acid Ethyl Esters (LOVAZA PO) Take 1,200 mg by mouth daily.    polyethylene glycol (MIRALAX / GLYCOLAX) packet Take 17 g by mouth at bedtime as needed for mild constipation or moderate constipation. Take if no BM up to 3 times weekly.    triamcinolone cream (KENALOG) 0.1 % Apply 1 application topically 2 (two) times daily as needed (irritation).       Allergies  Allergen Reactions  . Aspirin Other (See Comments)    Upset stomach  . Celecoxib Other (See Comments)    Upset stomach  . Diclofenac Sodium Other (See Comments)    Pt does not remember   . Ibuprofen Other (See Comments)    Upset stomach   . Naproxen Other (See Comments)    Upset stomach   . Sulindac Other (See Comments)    Pt does not remember    Follow-up Information    Follow up with Hoyle Sauer, MD. Schedule an appointment as soon as possible for a visit in 1 week.   Specialty:  Internal Medicine   Contact information:   [address] [place] [address] [phone]        The results of significant diagnostics from this hospitalization (including imaging, microbiology, ancillary and laboratory) are listed below for reference.    Significant Diagnostic Studies: US Abdomen Complete  05/06/2015   CLINICAL DATA:  Abdominal pain  EXAM: ULTRASOUND ABDOMEN COMPLETE  COMPARISON:  05/06/2015  FINDINGS: Gallbladder: Single 1.4 cm gallstone is identified. No significant wall thickening or pericholecystic fluid is noted. A negative sonographic Eulah Pont sign is noted.  Common bile duct: Diameter: 3 mm.  Liver: No focal lesion identified. Within normal limits in parenchymal echogenicity.  IVC: No abnormality visualized.  Pancreas: Not well visualized due to overlying bowel gas.  Spleen: Size and appearance within normal limits.  Right Kidney: Length: 10.4 cm. Echogenicity within normal limits. No mass or hydronephrosis visualized.  Left Kidney: Length: 10.2 cm. Echogenicity within normal limits. No mass or  hydronephrosis visualized.  Abdominal aorta: No aneurysm visualized.  Other findings: None.  IMPRESSION: Cholelithiasis without significant wall thickening or pericholecystic fluid.  No other focal abnormality is noted.   Electronically Signed   By: Alcide Clever M.D.   On: 05/06/2015 15:30   Ct Abdomen Pelvis W Contrast  05/06/2015   CLINICAL DATA:  Lower abdominal pain.  Nausea since yesterday.  EXAM: CT ABDOMEN AND PELVIS WITH CONTRAST  TECHNIQUE: Multidetector CT imaging of the abdomen and pelvis was performed using the standard protocol following bolus administration of intravenous contrast.  CONTRAST:  50mL OMNIPAQUE IOHEXOL 300 MG/ML SOLN, OMNIPAQUE IOHEXOL 300 MG/ML SOLN  COMPARISON:  12/23/2014  FINDINGS: The lung bases are clear.  The liver demonstrates no focal abnormality. There is no intrahepatic or extrahepatic biliary ductal dilatation. Mild pericholecystic fluid and mild gallbladder wall thickening. The spleen demonstrates no focal abnormality. The pancreas is normal. There is a 16 mm hypodense, fluid attenuating left interpolar renal mass most consistent with a cyst. There is a 4 mm hypodense lesion in the right interpolar aspect of the kidney too small to characterize. There is no obstructive uropathy. The adrenal glands are normal. The bladder is unremarkable.  The stomach, duodenum, small intestine, and large intestine demonstrate no contrast extravasation or dilatation. There is a moderate size hiatal hernia. There is a small fat containing umbilical hernia. There is no pneumoperitoneum, pneumatosis, or portal venous gas. There is no abdominal or pelvic free fluid. There is no lymphadenopathy.  The abdominal aorta is normal in caliber with atherosclerosis.  There are no lytic or sclerotic osseous lesions. There is a chronic L5 vertebral body compression fracture status post augmentation. There is methylmethacrylate within the L5 vertebral body with extrusion of methylmethacrylate into  the spinal canal along the right paracentral aspect and extending into the right neural foramen with overall right lateral recess stenosis at L5-S1 and right foraminal stenosis. There is bilateral facet arthropathy throughout the lumbar spine. There is grade 1 anterolisthesis of L4 on L5. There is bilateral foraminal stenosis at L4-5.  IMPRESSION: 1. Mild gallbladder wall thickening and pericholecystic fluid as can be seen with acute cholecystitis. Correlate with clinical exam. If there is further clinical concern, recommend a right upper quadrant ultrasound. 2. There is a chronic L5 vertebral body compression fracture status post augmentation. There is methylmethacrylate within the L5 vertebral body with extrusion of methylmethacrylate into the spinal canal along the right paracentral aspect and extending into the right neural foramen with overall right lateral recess stenosis at L5-S1  and right foraminal stenosis.   Electronically Signed   By: Elige KoHetal  Patel   On: 05/06/2015 14:08    Microbiology: Recent Results (from the past 240 hour(s))  Urine culture     Status: None   Collection Time: 05/06/15  4:19 PM  Result Value Ref Range Status   Specimen Description URINE, RANDOM  Final   Special Requests NONE  Final   Colony Count   Final    45,000 COLONIES/ML Performed at [facility]    Culture   Final    KLEBSIELLA PNEUMONIAE Performed at [facility]    Report Status 05/08/2015 FINAL  Final   Organism ID, Bacteria KLEBSIELLA PNEUMONIAE  Final      Susceptibility   Klebsiella pneumoniae - MIC*    AMPICILLIN >=32 RESISTANT Resistant     CEFAZOLIN <=4 SENSITIVE Sensitive     CEFTRIAXONE <=1 SENSITIVE Sensitive     CIPROFLOXACIN <=0.25 SENSITIVE Sensitive     GENTAMICIN <=1 SENSITIVE Sensitive     LEVOFLOXACIN <=0.12 SENSITIVE Sensitive     NITROFURANTOIN 32 SENSITIVE Sensitive     TOBRAMYCIN <=1 SENSITIVE Sensitive     TRIMETH/SULFA <=20 SENSITIVE Sensitive     PIP/TAZO <=4  SENSITIVE Sensitive     * KLEBSIELLA PNEUMONIAE  MRSA PCR Screening     Status: Abnormal   Collection Time: 05/07/15 12:30 AM  Result Value Ref Range Status   MRSA by PCR POSITIVE (A) NEGATIVE Final    Comment:        The GeneXpert MRSA Assay (FDA approved for NASAL specimens only), is one component of a comprehensive MRSA colonization surveillance program. It is not intended to diagnose MRSA infection nor to guide or monitor treatment for MRSA infections. RESULT CALLED TO, READ BACK BY AND VERIFIED WITH: L REINERT @ 0428 ON 05/07/15 BY C DAVIS      Labs: Basic Metabolic Panel:  Recent Labs Lab 05/07/15 0511 05/08/15 0501 05/09/15 0510 05/10/15 0540 05/11/15 0413  05/12/15 1055 05/12/15 1655 05/12/15 2030 05/13/15 0157 05/13/15 0840  NA 120* 119* 121* 124* 124*  < > 135 134* 133* 134* 135  K 4.3 3.5 3.2* 3.7 4.0  --   --   --   --   --   --   CL 88* 87* 91* 95* 95*  --   --   --   --   --   --   CO2 24 23 22  21* 23  --   --   --   --   --   --   GLUCOSE 91 105* 92 84 91  --   --   --   --   --   --   BUN 12 9 9 10 9   --   --   --   --   --   --   CREATININE 0.79 0.74 0.70 0.80 0.81  --   --   --   --  0.97  --   CALCIUM 8.9 8.8* 8.5* 8.8* 9.1  --   --   --   --   --   --   < > = values in this interval not displayed. Liver Function Tests:  Recent Labs Lab 05/06/15 1230  AST 23  ALT 20  ALKPHOS 85  BILITOT 0.6  PROT 6.7  ALBUMIN 3.6   No results for input(s): LIPASE, AMYLASE in the last 168 hours. No results for input(s): AMMONIA in the last 168 hours. CBC:  Recent Labs Lab 05/06/15 1230 05/07/15 0511  WBC 10.2 11.5*  HGB 11.4* 10.6*  HCT 31.6* 29.6*  MCV 96.9 99.0  PLT 338 290   Cardiac Enzymes: No results for input(s): CKTOTAL, CKMB, CKMBINDEX, TROPONINI in the last 168 hours. BNP: BNP (last 3 results) No results for input(s): BNP in the last 8760 hours.  ProBNP (last 3 results) No results for input(s): PROBNP in the last 8760  hours.  CBG:  Recent Labs Lab 05/10/15 2021 05/11/15 0102 05/11/15 0451 05/11/15 0748 05/11/15 1216  GLUCAP 109* 94 92 79 89       Signed:  Karel Turpen  [facility] 05/13/2015, 10:07 AM

## 2015-05-13 NOTE — Progress Notes (Signed)
[facility]. CSW confirmed with Shawna at ALF that patient is ok to return. Patient & daughter, [name] at bedside aware. Discharge packet given to RN, Doris. Daughter to transport back to facility.     Lincoln MaxinKelly Broady Lafoy, LCSW [facility] Clinical Social Worker cell #: [phone]

## 2015-09-16 ENCOUNTER — Emergency Department (HOSPITAL_COMMUNITY): Payer: Commercial Managed Care - HMO

## 2015-09-16 ENCOUNTER — Encounter (HOSPITAL_COMMUNITY)
Admission: EM | Disposition: A | Discharge status: Skilled Nursing Facility | Payer: Self-pay | Source: Home / Self Care | Attending: Internal Medicine

## 2015-09-16 ENCOUNTER — Encounter (HOSPITAL_COMMUNITY): Payer: Self-pay | Admitting: Neurology

## 2015-09-16 ENCOUNTER — Inpatient Hospital Stay (HOSPITAL_COMMUNITY)
Admission: EM | Admit: 2015-09-16 | Discharge: 2015-09-21 | DRG: 871 | Disposition: A | Discharge status: Skilled Nursing Facility | Payer: Commercial Managed Care - HMO | Attending: Internal Medicine | Admitting: Internal Medicine

## 2015-09-16 ENCOUNTER — Inpatient Hospital Stay (HOSPITAL_COMMUNITY): Payer: Commercial Managed Care - HMO | Admitting: Certified Registered"

## 2015-09-16 DIAGNOSIS — E876 Hypokalemia: Secondary | ICD-10-CM | POA: Diagnosis present

## 2015-09-16 DIAGNOSIS — E872 Acidosis: Secondary | ICD-10-CM

## 2015-09-16 DIAGNOSIS — K5900 Constipation, unspecified: Secondary | ICD-10-CM | POA: Diagnosis present

## 2015-09-16 DIAGNOSIS — Z7722 Contact with and (suspected) exposure to environmental tobacco smoke (acute) (chronic): Secondary | ICD-10-CM | POA: Diagnosis present

## 2015-09-16 DIAGNOSIS — F0390 Unspecified dementia without behavioral disturbance: Secondary | ICD-10-CM | POA: Diagnosis present

## 2015-09-16 DIAGNOSIS — Z66 Do not resuscitate: Secondary | ICD-10-CM | POA: Diagnosis present

## 2015-09-16 DIAGNOSIS — K219 Gastro-esophageal reflux disease without esophagitis: Secondary | ICD-10-CM | POA: Diagnosis present

## 2015-09-16 DIAGNOSIS — I5032 Chronic diastolic (congestive) heart failure: Secondary | ICD-10-CM | POA: Diagnosis present

## 2015-09-16 DIAGNOSIS — H9190 Unspecified hearing loss, unspecified ear: Secondary | ICD-10-CM | POA: Diagnosis present

## 2015-09-16 DIAGNOSIS — N179 Acute kidney failure, unspecified: Secondary | ICD-10-CM | POA: Diagnosis present

## 2015-09-16 DIAGNOSIS — E440 Moderate protein-calorie malnutrition: Secondary | ICD-10-CM | POA: Insufficient documentation

## 2015-09-16 DIAGNOSIS — G9340 Encephalopathy, unspecified: Secondary | ICD-10-CM | POA: Diagnosis not present

## 2015-09-16 DIAGNOSIS — G8929 Other chronic pain: Secondary | ICD-10-CM | POA: Diagnosis present

## 2015-09-16 DIAGNOSIS — Z888 Allergy status to other drugs, medicaments and biological substances status: Secondary | ICD-10-CM | POA: Diagnosis not present

## 2015-09-16 DIAGNOSIS — L0291 Cutaneous abscess, unspecified: Secondary | ICD-10-CM | POA: Diagnosis not present

## 2015-09-16 DIAGNOSIS — Z22322 Carrier or suspected carrier of Methicillin resistant Staphylococcus aureus: Secondary | ICD-10-CM | POA: Diagnosis not present

## 2015-09-16 DIAGNOSIS — Z79899 Other long term (current) drug therapy: Secondary | ICD-10-CM | POA: Diagnosis not present

## 2015-09-16 DIAGNOSIS — E871 Hypo-osmolality and hyponatremia: Secondary | ICD-10-CM

## 2015-09-16 DIAGNOSIS — E1149 Type 2 diabetes mellitus with other diabetic neurological complication: Secondary | ICD-10-CM | POA: Diagnosis present

## 2015-09-16 DIAGNOSIS — E222 Syndrome of inappropriate secretion of antidiuretic hormone: Secondary | ICD-10-CM | POA: Diagnosis present

## 2015-09-16 DIAGNOSIS — G4733 Obstructive sleep apnea (adult) (pediatric): Secondary | ICD-10-CM | POA: Diagnosis present

## 2015-09-16 DIAGNOSIS — D638 Anemia in other chronic diseases classified elsewhere: Secondary | ICD-10-CM | POA: Diagnosis present

## 2015-09-16 DIAGNOSIS — F4323 Adjustment disorder with mixed anxiety and depressed mood: Secondary | ICD-10-CM | POA: Diagnosis present

## 2015-09-16 DIAGNOSIS — M109 Gout, unspecified: Secondary | ICD-10-CM | POA: Diagnosis present

## 2015-09-16 DIAGNOSIS — E785 Hyperlipidemia, unspecified: Secondary | ICD-10-CM | POA: Diagnosis present

## 2015-09-16 DIAGNOSIS — R7989 Other specified abnormal findings of blood chemistry: Secondary | ICD-10-CM | POA: Insufficient documentation

## 2015-09-16 DIAGNOSIS — Z981 Arthrodesis status: Secondary | ICD-10-CM

## 2015-09-16 DIAGNOSIS — L03317 Cellulitis of buttock: Secondary | ICD-10-CM | POA: Insufficient documentation

## 2015-09-16 DIAGNOSIS — E039 Hypothyroidism, unspecified: Secondary | ICD-10-CM | POA: Diagnosis present

## 2015-09-16 DIAGNOSIS — R680 Hypothermia, not associated with low environmental temperature: Secondary | ICD-10-CM | POA: Diagnosis present

## 2015-09-16 DIAGNOSIS — L259 Unspecified contact dermatitis, unspecified cause: Secondary | ICD-10-CM | POA: Diagnosis present

## 2015-09-16 DIAGNOSIS — J45909 Unspecified asthma, uncomplicated: Secondary | ICD-10-CM | POA: Diagnosis present

## 2015-09-16 DIAGNOSIS — D72829 Elevated white blood cell count, unspecified: Secondary | ICD-10-CM | POA: Insufficient documentation

## 2015-09-16 DIAGNOSIS — R32 Unspecified urinary incontinence: Secondary | ICD-10-CM | POA: Diagnosis present

## 2015-09-16 DIAGNOSIS — Z96651 Presence of right artificial knee joint: Secondary | ICD-10-CM | POA: Diagnosis present

## 2015-09-16 DIAGNOSIS — D649 Anemia, unspecified: Secondary | ICD-10-CM | POA: Diagnosis present

## 2015-09-16 DIAGNOSIS — I130 Hypertensive heart and chronic kidney disease with heart failure and stage 1 through stage 4 chronic kidney disease, or unspecified chronic kidney disease: Secondary | ICD-10-CM | POA: Diagnosis present

## 2015-09-16 DIAGNOSIS — Z87440 Personal history of urinary (tract) infections: Secondary | ICD-10-CM | POA: Diagnosis not present

## 2015-09-16 DIAGNOSIS — E860 Dehydration: Secondary | ICD-10-CM | POA: Diagnosis present

## 2015-09-16 DIAGNOSIS — L0231 Cutaneous abscess of buttock: Secondary | ICD-10-CM | POA: Diagnosis present

## 2015-09-16 DIAGNOSIS — Z886 Allergy status to analgesic agent status: Secondary | ICD-10-CM | POA: Diagnosis not present

## 2015-09-16 DIAGNOSIS — Z791 Long term (current) use of non-steroidal anti-inflammatories (NSAID): Secondary | ICD-10-CM

## 2015-09-16 DIAGNOSIS — A419 Sepsis, unspecified organism: Secondary | ICD-10-CM | POA: Diagnosis not present

## 2015-09-16 DIAGNOSIS — E1122 Type 2 diabetes mellitus with diabetic chronic kidney disease: Secondary | ICD-10-CM | POA: Diagnosis present

## 2015-09-16 HISTORY — PX: INCISION AND DRAINAGE PERIRECTAL ABSCESS: SHX1804

## 2015-09-16 LAB — COMPREHENSIVE METABOLIC PANEL
ALT: 35 U/L (ref 14–54)
AST: 40 U/L (ref 15–41)
Albumin: 1.8 g/dL — ABNORMAL LOW (ref 3.5–5.0)
Alkaline Phosphatase: 144 U/L — ABNORMAL HIGH (ref 38–126)
Anion gap: 9 (ref 5–15)
BUN: 32 mg/dL — AB (ref 6–20)
CHLORIDE: 99 mmol/L — AB (ref 101–111)
CO2: 22 mmol/L (ref 22–32)
CREATININE: 1.54 mg/dL — AB (ref 0.44–1.00)
Calcium: 9.9 mg/dL (ref 8.9–10.3)
GFR calc Af Amer: 34 mL/min — ABNORMAL LOW (ref >60)
GFR calc non Af Amer: 29 mL/min — ABNORMAL LOW (ref >60)
Glucose, Bld: 107 mg/dL — ABNORMAL HIGH (ref 65–99)
Potassium: 4.5 mmol/L (ref 3.5–5.1)
SODIUM: 130 mmol/L — AB (ref 135–145)
Total Bilirubin: 0.4 mg/dL (ref 0.3–1.2)
Total Protein: 5.3 g/dL — ABNORMAL LOW (ref 6.5–8.1)

## 2015-09-16 LAB — BASIC METABOLIC PANEL
ANION GAP: 5 (ref 5–15)
BUN: 28 mg/dL — ABNORMAL HIGH (ref 6–20)
CALCIUM: 8.7 mg/dL — AB (ref 8.9–10.3)
CO2: 20 mmol/L — ABNORMAL LOW (ref 22–32)
Chloride: 105 mmol/L (ref 101–111)
Creatinine, Ser: 1.22 mg/dL — ABNORMAL HIGH (ref 0.44–1.00)
GFR, EST AFRICAN AMERICAN: 44 mL/min — AB (ref >60)
GFR, EST NON AFRICAN AMERICAN: 38 mL/min — AB (ref >60)
GLUCOSE: 125 mg/dL — AB (ref 65–99)
POTASSIUM: 4.2 mmol/L (ref 3.5–5.1)
SODIUM: 130 mmol/L — AB (ref 135–145)

## 2015-09-16 LAB — URINALYSIS, ROUTINE W REFLEX MICROSCOPIC
Bilirubin Urine: NEGATIVE
GLUCOSE, UA: NEGATIVE mg/dL
Hgb urine dipstick: NEGATIVE
KETONES UR: NEGATIVE mg/dL
LEUKOCYTES UA: NEGATIVE
Nitrite: NEGATIVE
PH: 5 (ref 5.0–8.0)
Protein, ur: NEGATIVE mg/dL
Specific Gravity, Urine: 1.017 (ref 1.005–1.030)
Urobilinogen, UA: 0.2 mg/dL (ref 0.0–1.0)

## 2015-09-16 LAB — I-STAT CG4 LACTIC ACID, ED
LACTIC ACID, VENOUS: 1.19 mmol/L (ref 0.5–2.0)
Lactic Acid, Venous: 2.48 mmol/L (ref 0.5–2.0)

## 2015-09-16 LAB — CBC
HCT: 27.5 % — ABNORMAL LOW (ref 36.0–46.0)
Hemoglobin: 9.3 g/dL — ABNORMAL LOW (ref 12.0–15.0)
MCH: 33.6 pg (ref 26.0–34.0)
MCHC: 33.8 g/dL (ref 30.0–36.0)
MCV: 99.3 fL (ref 78.0–100.0)
PLATELETS: 351 10*3/uL (ref 150–400)
RBC: 2.77 MIL/uL — AB (ref 3.87–5.11)
RDW: 13.7 % (ref 11.5–15.5)
WBC: 30.5 10*3/uL — ABNORMAL HIGH (ref 4.0–10.5)

## 2015-09-16 LAB — LIPASE, BLOOD: LIPASE: 13 U/L — AB (ref 22–51)

## 2015-09-16 LAB — GLUCOSE, CAPILLARY: Glucose-Capillary: 97 mg/dL (ref 65–99)

## 2015-09-16 LAB — PROCALCITONIN: Procalcitonin: 0.39 ng/mL

## 2015-09-16 SURGERY — INCISION AND DRAINAGE, ABSCESS, PERIRECTAL
Anesthesia: General

## 2015-09-16 MED ORDER — SODIUM CHLORIDE 0.9 % IV BOLUS (SEPSIS)
500.0000 mL | INTRAVENOUS | Status: AC
  Administered 2015-09-16: 500 mL via INTRAVENOUS

## 2015-09-16 MED ORDER — CETYLPYRIDINIUM CHLORIDE 0.05 % MT LIQD
7.0000 mL | Freq: Two times a day (BID) | OROMUCOSAL | Status: DC
Start: 2015-09-17 — End: 2015-09-21
  Administered 2015-09-17 – 2015-09-21 (×9): 7 mL via OROMUCOSAL

## 2015-09-16 MED ORDER — LEVOTHYROXINE SODIUM 100 MCG IV SOLR
44.0000 ug | Freq: Every day | INTRAVENOUS | Status: DC
  Administered 2015-09-17 – 2015-09-18 (×2): 44 ug via INTRAVENOUS
  Filled 2015-09-16 (×3): qty 5

## 2015-09-16 MED ORDER — FENTANYL CITRATE (PF) 100 MCG/2ML IJ SOLN
25.0000 ug | INTRAMUSCULAR | Status: DC | PRN
Start: 2015-09-16 — End: 2015-09-16
  Administered 2015-09-16 (×2): 25 ug via INTRAVENOUS

## 2015-09-16 MED ORDER — MORPHINE SULFATE (PF) 2 MG/ML IV SOLN
1.0000 mg | INTRAVENOUS | Status: DC | PRN
  Filled 2015-09-16: qty 1

## 2015-09-16 MED ORDER — ACETAMINOPHEN 325 MG PO TABS
650.0000 mg | ORAL_TABLET | Freq: Four times a day (QID) | ORAL | Status: DC | PRN
  Administered 2015-09-18: 650 mg via ORAL
  Filled 2015-09-16: qty 2

## 2015-09-16 MED ORDER — ACETAMINOPHEN 650 MG RE SUPP: 650.0000 mg | Freq: Four times a day (QID) | RECTAL | Status: DC | PRN

## 2015-09-16 MED ORDER — FLUTICASONE PROPIONATE 50 MCG/ACT NA SUSP
1.0000 | Freq: Every day | NASAL | Status: DC | PRN
  Filled 2015-09-16: qty 16

## 2015-09-16 MED ORDER — VANCOMYCIN HCL IN DEXTROSE 1-5 GM/200ML-% IV SOLN
1000.0000 mg | INTRAVENOUS | Status: DC
  Administered 2015-09-17 – 2015-09-21 (×5): 1000 mg via INTRAVENOUS
  Filled 2015-09-16 (×5): qty 200

## 2015-09-16 MED ORDER — PHENYLEPHRINE HCL 10 MG/ML IJ SOLN
INTRAMUSCULAR | Status: DC | PRN
  Administered 2015-09-16: 120 ug via INTRAVENOUS
  Administered 2015-09-16: 80 ug via INTRAVENOUS
  Administered 2015-09-16: 120 ug via INTRAVENOUS
  Administered 2015-09-16 (×2): 80 ug via INTRAVENOUS

## 2015-09-16 MED ORDER — PIPERACILLIN-TAZOBACTAM 3.375 G IVPB
3.3750 g | Freq: Three times a day (TID) | INTRAVENOUS | Status: DC
  Administered 2015-09-17 – 2015-09-18 (×5): 3.375 g via INTRAVENOUS
  Filled 2015-09-16 (×8): qty 50

## 2015-09-16 MED ORDER — MORPHINE SULFATE (PF) 2 MG/ML IV SOLN: 1.0000 mg | INTRAVENOUS | Status: DC | PRN

## 2015-09-16 MED ORDER — ONDANSETRON HCL 4 MG/2ML IJ SOLN
INTRAMUSCULAR | Status: AC
  Filled 2015-09-16: qty 2

## 2015-09-16 MED ORDER — SUCCINYLCHOLINE CHLORIDE 20 MG/ML IJ SOLN
INTRAMUSCULAR | Status: AC
  Filled 2015-09-16: qty 1

## 2015-09-16 MED ORDER — ALBUTEROL SULFATE (2.5 MG/3ML) 0.083% IN NEBU: 2.5000 mg | INHALATION_SOLUTION | RESPIRATORY_TRACT | Status: DC | PRN

## 2015-09-16 MED ORDER — IOHEXOL 300 MG/ML  SOLN: 25.0000 mL | Freq: Once | INTRAMUSCULAR | Status: DC | PRN

## 2015-09-16 MED ORDER — PROPOFOL 10 MG/ML IV BOLUS
INTRAVENOUS | Status: AC
  Filled 2015-09-16: qty 20

## 2015-09-16 MED ORDER — 0.9 % SODIUM CHLORIDE (POUR BTL) OPTIME
TOPICAL | Status: DC | PRN
  Administered 2015-09-16: 1000 mL

## 2015-09-16 MED ORDER — LIDOCAINE HCL (CARDIAC) 20 MG/ML IV SOLN
INTRAVENOUS | Status: DC | PRN
Start: 2015-09-16 — End: 2015-09-16
  Administered 2015-09-16: 60 mg via INTRAVENOUS

## 2015-09-16 MED ORDER — MUPIROCIN 2 % EX OINT
1.0000 "application " | TOPICAL_OINTMENT | Freq: Once | CUTANEOUS | Status: AC
  Administered 2015-09-16: 1 via TOPICAL
  Filled 2015-09-16 (×2): qty 22

## 2015-09-16 MED ORDER — PHENYLEPHRINE 40 MCG/ML (10ML) SYRINGE FOR IV PUSH (FOR BLOOD PRESSURE SUPPORT)
PREFILLED_SYRINGE | INTRAVENOUS | Status: AC
  Filled 2015-09-16: qty 10

## 2015-09-16 MED ORDER — FENTANYL CITRATE (PF) 250 MCG/5ML IJ SOLN
INTRAMUSCULAR | Status: AC
  Filled 2015-09-16: qty 5

## 2015-09-16 MED ORDER — VANCOMYCIN HCL IN DEXTROSE 1-5 GM/200ML-% IV SOLN
1000.0000 mg | Freq: Once | INTRAVENOUS | Status: AC
Start: 2015-09-16 — End: 2015-09-16
  Administered 2015-09-16: 1000 mg via INTRAVENOUS
  Filled 2015-09-16: qty 200

## 2015-09-16 MED ORDER — SODIUM CHLORIDE 0.9 % IV SOLN
INTRAVENOUS | Status: DC
  Administered 2015-09-16 – 2015-09-19 (×5): via INTRAVENOUS

## 2015-09-16 MED ORDER — SODIUM CHLORIDE 0.9 % IV BOLUS (SEPSIS)
1000.0000 mL | INTRAVENOUS | Status: AC
  Administered 2015-09-16 (×2): 1000 mL via INTRAVENOUS

## 2015-09-16 MED ORDER — PIPERACILLIN-TAZOBACTAM 3.375 G IVPB 30 MIN
3.3750 g | Freq: Once | INTRAVENOUS | Status: AC
  Administered 2015-09-16: 3.375 g via INTRAVENOUS
  Filled 2015-09-16: qty 50

## 2015-09-16 MED ORDER — SODIUM CHLORIDE 0.9 % IJ SOLN
3.0000 mL | Freq: Two times a day (BID) | INTRAMUSCULAR | Status: DC
  Administered 2015-09-17 – 2015-09-21 (×3): 3 mL via INTRAVENOUS

## 2015-09-16 MED ORDER — HEPARIN SODIUM (PORCINE) 5000 UNIT/ML IJ SOLN
5000.0000 [IU] | Freq: Three times a day (TID) | INTRAMUSCULAR | Status: DC
  Administered 2015-09-17 – 2015-09-21 (×12): 5000 [IU] via SUBCUTANEOUS
  Filled 2015-09-16 (×8): qty 1

## 2015-09-16 MED ORDER — ONDANSETRON HCL 4 MG/2ML IJ SOLN
INTRAMUSCULAR | Status: DC | PRN
Start: 2015-09-16 — End: 2015-09-16
  Administered 2015-09-16: 4 mg via INTRAVENOUS

## 2015-09-16 MED ORDER — PROPOFOL 10 MG/ML IV BOLUS
INTRAVENOUS | Status: DC | PRN
  Administered 2015-09-16: 70 mg via INTRAVENOUS

## 2015-09-16 MED ORDER — CLONAZEPAM 1 MG PO TABS
1.0000 mg | ORAL_TABLET | Freq: Every day | ORAL | Status: DC
  Administered 2015-09-16 – 2015-09-20 (×4): 1 mg via ORAL
  Filled 2015-09-16 (×5): qty 1

## 2015-09-16 MED ORDER — FENTANYL CITRATE (PF) 100 MCG/2ML IJ SOLN
INTRAMUSCULAR | Status: AC
  Administered 2015-09-16: 25 ug via INTRAVENOUS
  Filled 2015-09-16: qty 2

## 2015-09-16 MED ORDER — FENTANYL CITRATE (PF) 250 MCG/5ML IJ SOLN
INTRAMUSCULAR | Status: DC | PRN
  Administered 2015-09-16: 50 ug via INTRAVENOUS
  Administered 2015-09-16: 25 ug via INTRAVENOUS

## 2015-09-16 MED ORDER — SODIUM CHLORIDE 0.9 % IV BOLUS (SEPSIS)
500.0000 mL | Freq: Once | INTRAVENOUS | Status: AC
  Administered 2015-09-16: 500 mL via INTRAVENOUS

## 2015-09-16 SURGICAL SUPPLY — 33 items
CANISTER SUCTION 2500CC (MISCELLANEOUS) ×3 IMPLANT
CLEANER TIP ELECTROSURG 2X2 (MISCELLANEOUS) IMPLANT
COVER SURGICAL LIGHT HANDLE (MISCELLANEOUS) ×3 IMPLANT
DRAPE UTILITY XL STRL (DRAPES) ×6 IMPLANT
DRSG PAD ABDOMINAL 8X10 ST (GAUZE/BANDAGES/DRESSINGS) ×3 IMPLANT
ELECT REM PT RETURN 9FT ADLT (ELECTROSURGICAL)
ELECTRODE REM PT RTRN 9FT ADLT (ELECTROSURGICAL) IMPLANT
GAUZE PACKING IODOFORM 1 (PACKING) IMPLANT
GAUZE SPONGE 4X4 12PLY STRL (GAUZE/BANDAGES/DRESSINGS) ×3 IMPLANT
GAUZE SPONGE 4X4 16PLY XRAY LF (GAUZE/BANDAGES/DRESSINGS) ×3 IMPLANT
GLOVE BIOGEL M STRL SZ7.5 (GLOVE) ×3 IMPLANT
GLOVE BIOGEL PI IND STRL 8 (GLOVE) ×2 IMPLANT
GLOVE BIOGEL PI INDICATOR 8 (GLOVE) ×4
GOWN STRL REUS W/ TWL LRG LVL3 (GOWN DISPOSABLE) ×1 IMPLANT
GOWN STRL REUS W/ TWL XL LVL3 (GOWN DISPOSABLE) ×1 IMPLANT
GOWN STRL REUS W/TWL LRG LVL3 (GOWN DISPOSABLE) ×3
GOWN STRL REUS W/TWL XL LVL3 (GOWN DISPOSABLE) ×3
KIT BASIN OR (CUSTOM PROCEDURE TRAY) ×3 IMPLANT
KIT ROOM TURNOVER OR (KITS) ×6 IMPLANT
NS IRRIG 1000ML POUR BTL (IV SOLUTION) ×3 IMPLANT
PACK LITHOTOMY IV (CUSTOM PROCEDURE TRAY) ×3 IMPLANT
PAD ARMBOARD 7.5X6 YLW CONV (MISCELLANEOUS) ×6 IMPLANT
PENCIL BUTTON HOLSTER BLD 10FT (ELECTRODE) IMPLANT
SPONGE GAUZE 4X4 12PLY STER LF (GAUZE/BANDAGES/DRESSINGS) ×2 IMPLANT
SWAB COLLECTION DEVICE MRSA (MISCELLANEOUS) ×3 IMPLANT
TOWEL OR 17X24 6PK STRL BLUE (TOWEL DISPOSABLE) ×3 IMPLANT
TOWEL OR 17X26 10 PK STRL BLUE (TOWEL DISPOSABLE) ×3 IMPLANT
TUBE ANAEROBIC SPECIMEN COL (MISCELLANEOUS) ×3 IMPLANT
TUBE CONNECTING 12'X1/4 (SUCTIONS) ×1
TUBE CONNECTING 12X1/4 (SUCTIONS) ×2 IMPLANT
UNDERPAD 30X30 INCONTINENT (UNDERPADS AND DIAPERS) ×3 IMPLANT
WATER STERILE IRR 1000ML POUR (IV SOLUTION) ×3 IMPLANT
YANKAUER SUCT BULB TIP NO VENT (SUCTIONS) ×3 IMPLANT

## 2015-09-16 NOTE — Anesthesia Procedure Notes (Signed)
Procedure Name: LMA Insertion Date/Time: 09/16/2015 5:53 PM Performed by: Jerilee Hoh Pre-anesthesia Checklist: Patient identified, Emergency Drugs available, Suction available and Patient being monitored Patient Re-evaluated:Patient Re-evaluated prior to inductionOxygen Delivery Method: Circle system utilized Preoxygenation: Pre-oxygenation with 100% oxygen Intubation Type: IV induction LMA: LMA inserted LMA Size: 4.0 Tube type: Oral Number of attempts: 1

## 2015-09-16 NOTE — Anesthesia Preprocedure Evaluation (Signed)
Anesthesia Evaluation  Patient identified by MRN, date of birth, ID band Patient awake    Reviewed: Allergy & Precautions, H&P , NPO status , Patient's Chart, lab work & pertinent test results  Airway Mallampati: II  TM Distance: >3 FB Neck ROM: Full    Dental no notable dental hx. (+) Teeth Intact, Dental Advisory Given   Pulmonary shortness of breath and with exertion, asthma ,    Pulmonary exam normal breath sounds clear to auscultation       Cardiovascular hypertension, Pt. on medications  Rhythm:Regular Rate:Normal     Neuro/Psych Anxiety negative neurological ROS     GI/Hepatic Neg liver ROS, GERD  Medicated and Controlled,  Endo/Other  negative endocrine ROSneg diabetesHyperthyroidism   Renal/GU Renal InsufficiencyRenal disease  negative genitourinary   Musculoskeletal  (+) Arthritis , Osteoarthritis,    Abdominal   Peds  Hematology negative hematology ROS (+)   Anesthesia Other Findings   Reproductive/Obstetrics negative OB ROS                             Anesthesia Physical Anesthesia Plan  ASA: III  Anesthesia Plan: General   Post-op Pain Management:    Induction: Intravenous  Airway Management Planned: Oral ETT  Additional Equipment:   Intra-op Plan:   Post-operative Plan: Extubation in OR  Informed Consent: I have reviewed the patients History and Physical, chart, labs and discussed the procedure including the risks, benefits and alternatives for the proposed anesthesia with the patient or authorized representative who has indicated his/her understanding and acceptance.   Dental advisory given  Plan Discussed with: CRNA  Anesthesia Plan Comments:         Anesthesia Quick Evaluation

## 2015-09-16 NOTE — Interval H&P Note (Signed)
History and Physical Interval Note:  09/16/2015 5:41 PM  Jillian Wheeler  has presented today for surgery, with the diagnosis of abscess  The various methods of treatment have been discussed with the patient and family. After consideration of risks, benefits and other options for treatment, the patient has consented to  Procedure(s): IRRIGATION AND DEBRIDEMENT PERIRECTAL ABSCESS (N/A) as a surgical intervention .  The patient's history has been reviewed, patient examined, no change in status, stable for surgery.  I have reviewed the patient's chart and labs.  Questions were answered to the patient's satisfaction.    Discussed risks/benefits with pt and daughter - bleeding, infection, chronic wound, heart/lung issues during/after surgery, death, inability to go back to independent living status, need for daily wound care, typical hospitalization. Pt agreed to her daughters statement that if she had a intraop event she wouldn't want to be resuscitated.   Mary Sella. Andrey Campanile, MD, FACS General, Bariatric, & Minimally Invasive Surgery [facility], [place]   [facility]

## 2015-09-16 NOTE — ED Notes (Signed)
Pt is here with daughter, c/o increasing weakness for 2 weeks,lower abd pain intermittently since February. Also is constipated, has UTI and has been treated with cipro but WBC  Count was 25.3 today., sent here from MD office for CT ab/pelvis. She also has stage 2 pressure ulcer to buttocks. Pt living in assisted living at [facility].

## 2015-09-16 NOTE — Progress Notes (Signed)
ANTIBIOTIC CONSULT NOTE - INITIAL  Pharmacy Consult for vancomycin, zosyn Indication: rule out sepsis  Allergies  Allergen Reactions  . Aspirin Other (See Comments)    Upset stomach  . Celecoxib Other (See Comments)    Upset stomach  . Diclofenac Sodium Other (See Comments)    Pt does not remember   . Ibuprofen Other (See Comments)    Upset stomach   . Naproxen Other (See Comments)    Upset stomach   . Sulindac Other (See Comments)    Pt does not remember     Patient Measurements:   Adjusted Body Weight:   Vital Signs: Temp: 97.6 F (36.4 C) (09/29 1205) Temp Source: Oral (09/29 1205) BP: 92/44 mmHg (09/29 1205) Pulse Rate: 77 (09/29 1205) Intake/Output from previous day:   Intake/Output from this shift:    Labs:  Recent Labs  09/16/15 1232  WBC 30.5*  HGB 9.3*  PLT 351  CREATININE 1.54*   CrCl cannot be calculated (Unknown ideal weight.). No results for input(s): VANCOTROUGH, VANCOPEAK, VANCORANDOM, GENTTROUGH, GENTPEAK, GENTRANDOM, TOBRATROUGH, TOBRAPEAK, TOBRARND, AMIKACINPEAK, AMIKACINTROU, AMIKACIN in the last 72 hours.   Microbiology: No results found for this or any previous visit (from the past 720 hour(s)).  Medical History: Past Medical History  Diagnosis Date  . Palpitations   . Unspecified fall   . Altered mental status   . Other nonspecific finding on examination of urine   . Symptomatic menopausal or female climacteric states   . Insomnia, unspecified   . Contact dermatitis and other eczema, due to unspecified cause   . Type II or unspecified type diabetes mellitus with neurological manifestations, not stated as uncontrolled   . Dermatophytosis of nail   . Mastodynia   . Contact dermatitis and other eczema due to solvents   . Chronic kidney disease, stage III (moderate)   . Acute bronchitis   . Wheezing   . Pain in joint, pelvic region and thigh   . Other speech disturbance(784.59)   . Unspecified acute reaction to stress   .  Other malaise and fatigue   . Spasm of muscle   . Blood in stool   . Other specified noninflammatory disorder of vagina   . Osteoarthrosis, unspecified whether generalized or localized, unspecified site   . Gout, unspecified   . Adjustment disorder with mixed anxiety and depressed mood   . Other vitamin B12 deficiency anemia   . Obstructive sleep apnea (adult) (pediatric)   . Other dyspnea and respiratory abnormality   . Type II or unspecified type diabetes mellitus without mention of complication, not stated as uncontrolled   . Unspecified asthma(493.90)   . Esophageal reflux   . Other and unspecified hyperlipidemia   . Thyrotoxicosis without mention of goiter or other cause, without mention of thyrotoxic crisis or storm   . Unspecified essential hypertension   . 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 SIADH (syndrome of inappropriate ADH production) 05/09/2015    Medications:  Anti-infectives    Start     Dose/Rate Route Frequency Ordered Stop   09/16/15 1300  piperacillin-tazobactam (ZOSYN) IVPB 3.375 g     3.375 g 100 mL/hr over 30 Minutes Intravenous  Once 09/16/15 1252     09/16/15 1300  vancomycin (VANCOCIN) IVPB 1000 mg/200 mL premix     1,000 mg 200 mL/hr over 60 Minutes Intravenous  Once 09/16/15 1252       Assessment: 81 yo female admitted with increasing weakness and intermittent abdominal pain. Initiating abx as patient  met criteria for code sepsis.  Notable labs: WBC 30.5, SCr 0.9 > 1.5, eCrCl ~ 30 ml/min, afebrile, LA 2.48.   Goal of Therapy:  Vancomycin trough level 15-20 mcg/ml  Plan:  -Zosyn 3.375 g IV q8h -Vancomycin 1 g IV q24h -Monitor renal fx, cultures, VT at Css as needed    Hughes Better, PharmD, BCPS Clinical Pharmacist Pager: [phone] 09/16/2015 1:18 PM

## 2015-09-16 NOTE — ED Provider Notes (Signed)
CSN: [id number]     Arrival date & time 09/16/15  1123 History   First MD Initiated Contact with Patient 09/16/15 1249     Chief Complaint  Patient presents with  . Abdominal Pain     (Consider location/radiation/quality/duration/timing/severity/associated sxs/prior Treatment) HPI   Jillian Wheeler is a 81 y.o. female in Independent Living at [facility] with a hx of CKD, asthma, SIADH, GERD, stage 2 decubitus ulcers presents to the Emergency Department complaining of gradual, persistent, progressively worsening weakness onset 2 weeks ago.   Most of the Hx is provided by her daughter who reports increased short term memory loss.  Pt was seen by PCP at [facility] on 09/14/15 by Dr. Felipa Eth.  At that time she had a WBC of 26.  A recheck today it was 25.3.  Associated symptoms include lower abd pain intermittently since Feb.  Pt also reports constipation and UTI treated with cipro IM and PO for the last 2 days.  Daughter reports increasing weakness and confusion.  Nothing makes it better and nothing makes it worse.  Pt's daughter denies fever, c/o abd pain, vomiting, diarrhea.  Pt is taking Tramadol and hydrocodone for chronic pain with resulting constipation.    Pt is a DNR.     Past Medical History  Diagnosis Date  . Palpitations   . Unspecified fall   . Altered mental status   . Other nonspecific finding on examination of urine   . Symptomatic menopausal or female climacteric states   . Insomnia, unspecified   . Contact dermatitis and other eczema, due to unspecified cause   . Type II or unspecified type diabetes mellitus with neurological manifestations, not stated as uncontrolled   . Dermatophytosis of nail   . Mastodynia   . Contact dermatitis and other eczema due to solvents   . Chronic kidney disease, stage III (moderate)   . Acute bronchitis   . Wheezing   . Pain in joint, pelvic region and thigh   . Other speech disturbance(784.59)   . Unspecified acute  reaction to stress   . Other malaise and fatigue   . Spasm of muscle   . Blood in stool   . Other specified noninflammatory disorder of vagina   . Osteoarthrosis, unspecified whether generalized or localized, unspecified site   . Gout, unspecified   . Adjustment disorder with mixed anxiety and depressed mood   . Other vitamin B12 deficiency anemia   . Obstructive sleep apnea (adult) (pediatric)   . Other dyspnea and respiratory abnormality   . Type II or unspecified type diabetes mellitus without mention of complication, not stated as uncontrolled   . Unspecified asthma(493.90)   . Esophageal reflux   . Other and unspecified hyperlipidemia   . Thyrotoxicosis without mention of goiter or other cause, without mention of thyrotoxic crisis or storm   . Unspecified essential hypertension   . 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 SIADH (syndrome of inappropriate ADH production) 05/09/2015   History reviewed. No pertinent past surgical history. Family History  Problem Relation Age of Onset  . Cancer Sister   . Osteoarthritis Mother   . Heart disease    . Stroke Sister    Social History  Substance Use Topics  . Smoking status: Never Smoker   . Smokeless tobacco: None  . Alcohol Use: No   OB History    No data available     Review of Systems  Constitutional: Negative for fever, diaphoresis, appetite change, fatigue and  unexpected weight change.  HENT: Negative for mouth sores.   Eyes: Negative for visual disturbance.  Respiratory: Negative for cough, chest tightness, shortness of breath and wheezing.   Cardiovascular: Negative for chest pain.  Gastrointestinal: Positive for abdominal pain. Negative for nausea, vomiting, diarrhea and constipation.  Endocrine: Negative for polydipsia, polyphagia and polyuria.  Genitourinary: Negative for dysuria, urgency, frequency and hematuria.  Musculoskeletal: Negative for back pain and neck stiffness.  Skin: Positive for color change and wound. Negative  for rash.  Allergic/Immunologic: Negative for immunocompromised state.  Neurological: Positive for weakness. Negative for syncope, light-headedness and headaches.  Hematological: Does not bruise/bleed easily.  Psychiatric/Behavioral: Positive for confusion. Negative for sleep disturbance. The patient is not nervous/anxious.       Allergies  Aspirin; Celecoxib; Diclofenac sodium; Ibuprofen; Naproxen; and Sulindac  Home Medications   Prior to Admission medications   Medication Sig Start Date End Date Taking? Authorizing Provider  acetaminophen (TYLENOL) 325 MG tablet Take 325 mg by mouth at bedtime. To be taken with Tramadol   Yes Historical Provider, MD  albuterol (PROVENTIL) (2.5 MG/3ML) 0.083% nebulizer solution Take 3 mLs (2.5 mg total) by nebulization every 2 (two) hours as needed for wheezing. 05/13/15  Yes Kathlen Mody, MD  allopurinol (ZYLOPRIM) 100 MG tablet Take 100 mg by mouth daily.  02/19/14  Yes Historical Provider, MD  amLODipine (NORVASC) 5 MG tablet Take 1 tablet (5 mg total) by mouth daily. 03/09/15  Yes Catarina Hartshorn, MD  atorvastatin (LIPITOR) 20 MG tablet Take 20 mg by mouth daily.  11/25/13  Yes Historical Provider, MD  bisacodyl (DULCOLAX) 10 MG suppository Place 10 mg rectally 2 (two) times daily.   Yes Historical Provider, MD  bisacodyl (DULCOLAX) 5 MG EC tablet Take 5 mg by mouth 2 (two) times daily.   Yes Historical Provider, MD  Calcium Carbonate-Vitamin D (CALTRATE 600+D) 600-400 MG-UNIT per tablet Take 1 tablet by mouth daily. At lunch and supper   Yes Historical Provider, MD  ciprofloxacin (CIPRO) 500 MG tablet Take 500 mg by mouth 2 (two) times daily.   Yes Historical Provider, MD  clonazePAM (KLONOPIN) 1 MG tablet 2 by mouth every morning , 1 by mouth every night at bedtime Patient taking differently: Take 1 mg by mouth at bedtime.  03/17/15  Yes Kimber Relic, MD  cyanocobalamin (,VITAMIN B-12,) 1000 MCG/ML injection Inject 1,000 mcg into the muscle every 30 (thirty)  days. On 28th   Yes Historical Provider, MD  fluticasone (FLONASE) 50 MCG/ACT nasal spray Place 1 spray into both nostrils daily as needed for allergies or rhinitis.   Yes Historical Provider, MD  levothyroxine (SYNTHROID, LEVOTHROID) 88 MCG tablet Take 88 mcg by mouth daily before breakfast.   Yes Historical Provider, MD  Melatonin 5 MG CAPS Take 5 mg by mouth at bedtime.    Yes Historical Provider, MD  Multiple Vitamins-Minerals (MULTIVITAMIN ADULTS 50+ PO) Take 1 capsule by mouth daily.   Yes Historical Provider, MD  Omega-3-acid Ethyl Esters (LOVAZA PO) Take 1,200 mg by mouth daily.   Yes Historical Provider, MD  Probiotic Product (PHILLIPS COLON HEALTH PO) Take 1 capsule by mouth daily as needed (constipation).   Yes Historical Provider, MD  traMADol (ULTRAM) 50 MG tablet Take 1 tablet (50 mg total) by mouth 3 (three) times daily. Patient taking differently: Take 50 mg by mouth 2 (two) times daily.  05/13/15  Yes Kathlen Mody, MD  triamcinolone cream (KENALOG) 0.1 % Apply 1 application topically 2 (two) times daily  as needed (irritation).   Yes Historical Provider, MD  sodium chloride 1 G tablet Take 1 tablet (1 g total) by mouth 3 (three) times daily with meals. Patient not taking: Reported on 09/16/2015 05/13/15   Kathlen Mody, MD  traMADol-acetaminophen (ULTRACET) 37.5-325 MG per tablet Take 1 tablet by mouth every 6 (six) hours as needed for moderate pain or severe pain. And 1 tablet by mouth nightly at 8pm Patient not taking: Reported on 09/16/2015 05/13/15   Kathlen Mody, MD   BP 113/40 mmHg  Pulse 78  Temp(Src) 97.6 F (36.4 C) (Oral)  Resp 24  Wt 131 lb (59.421 kg)  SpO2 100% Physical Exam  Constitutional: She appears well-developed and well-nourished. No distress.  Awake, alert, nontoxic appearance  HENT:  Head: Normocephalic and atraumatic.  Mouth/Throat: Oropharynx is clear and moist. No oropharyngeal exudate.  Eyes: Conjunctivae are normal. No scleral icterus.  Neck: Normal  range of motion. Neck supple.  Cardiovascular: Normal rate, regular rhythm, normal heart sounds and intact distal pulses.   No murmur heard. Pulmonary/Chest: Effort normal and breath sounds normal. No respiratory distress. She has no wheezes.  Equal chest expansion  Abdominal: Soft. Bowel sounds are normal. She exhibits no mass. There is tenderness. There is no rigidity, no rebound, no guarding and no CVA tenderness.  Generally tender with intermittent guarding No CVA tenderness  Musculoskeletal: Normal range of motion. She exhibits edema ( 2+).  Neurological: She is alert.  Speech is clear and patient is able to answer orientation questions Moves extremities without ataxia Patient is too weak to walk  Skin: Skin is warm and dry. She is not diaphoretic. There is pallor.  Pressure ulcers noted to the right buttock with induration, evidence of abscess at the gluteal cleft.  Palpation of the surrounding tissue expresses large amounts of purulent drainage from the open wound of the right buttock  Psychiatric: She has a normal mood and affect.  Nursing note and vitals reviewed.   ED Course  Procedures (including critical care time) Labs Review Labs Reviewed  LIPASE, BLOOD - Abnormal; Notable for the following:    Lipase 13 (*)    All other components within normal limits  COMPREHENSIVE METABOLIC PANEL - Abnormal; Notable for the following:    Sodium 130 (*)    Chloride 99 (*)    Glucose, Bld 107 (*)    BUN 32 (*)    Creatinine, Ser 1.54 (*)    Total Protein 5.3 (*)    Albumin 1.8 (*)    Alkaline Phosphatase 144 (*)    GFR calc non Af Amer 29 (*)    GFR calc Af Amer 34 (*)    All other components within normal limits  CBC - Abnormal; Notable for the following:    WBC 30.5 (*)    RBC 2.77 (*)    Hemoglobin 9.3 (*)    HCT 27.5 (*)    All other components within normal limits  I-STAT CG4 LACTIC ACID, ED - Abnormal; Notable for the following:    Lactic Acid, Venous 2.48 (*)    All  other components within normal limits  CULTURE, BLOOD (ROUTINE X 2)  CULTURE, BLOOD (ROUTINE X 2)  URINE CULTURE  URINALYSIS, ROUTINE W REFLEX MICROSCOPIC (NOT AT [facility])  PROCALCITONIN  CORTISOL  I-STAT CG4 LACTIC ACID, ED    Imaging Review Ct Abdomen Pelvis Wo Contrast  09/16/2015   CLINICAL DATA:  Increasing weakness over the past 14 days. Intermittent lower abdominal pain since February,  2016. Constipation. Urinary tract infection. Elevated white blood cell count. Subsequent encounter.  EXAM: CT ABDOMEN AND PELVIS WITHOUT CONTRAST  TECHNIQUE: Multidetector CT imaging of the abdomen and pelvis was performed following the standard protocol without IV contrast.  COMPARISON:  CT abdomen and pelvis 05/06/2015 and 12/23/2014.  FINDINGS: Hiatal hernia is noted. No pleural or pericardial effusion. Heart size is upper normal. Dependent atelectasis is seen lung bases.  A few small stones are seen in the gallbladder. The liver, spleen, adrenal glands and pancreas are unremarkable. There is cortical atrophy and kidneys. Small cyst in the upper pole of the left kidney is again seen, unchanged.  There a new area of increased attenuation in subcutaneous fat along the right cleft of the buttock. Area of involvement measures 4.4 cm transverse by 6.0 cm AP by approximately 6.5 cm craniocaudal. Gas in subcutaneous fat is seen the left buttock possibly from prior injection.  The patient is status post hysterectomy. Patient's sigmoid diverticulosis in an otherwise unremarkable: The patient appears to be status post appendectomy. The small bowel is unremarkable. No lymphadenopathy, free air or intra-abdominal focal fluid collection is identified. Fat containing umbilical hernia is unchanged.  Bones demonstrate a fracture of L5 where the patient is status post vertebral augmentation. Methylmethacrylate extends into the right lateral recess and foramen and the L4-5 disc interspace, unchanged. No acute fracture is seen. The  patient has convex left lumbar scoliosis and multilevel spondylosis.  IMPRESSION: Infiltration of fat along the right cleft of the buttock is nonspecific but could be due to contusion or phlegmon and cellulitis. This area should be amenable to direct visual inspection.  No acute finding in the abdomen or pelvis.  Gallstones without evidence of cholecystitis.  Hiatal hernia.  Atherosclerosis.  Lumbar spondylosis. Status post L5 vertebral augmentation, unchanged in appearance.   Electronically Signed   By: Drusilla Kanner M.D.   On: 09/16/2015 14:38   Dg Chest Port 1 View  09/16/2015   CLINICAL DATA:  Sepsis.  Weakness.  Initial encounter.  EXAM: PORTABLE CHEST 1 VIEW  COMPARISON:  03/03/2015.  FINDINGS: Cardiopericardial silhouette within normal limits. Mediastinal contours normal. Trachea midline. No airspace disease or effusion. Lung volumes are slightly low. Monitoring leads project over the chest.  IMPRESSION: No active disease.   Electronically Signed   By: Andreas Newport M.D.   On: 09/16/2015 13:33   I have personally reviewed and evaluated these images and lab results as part of my medical decision-making.   EKG Interpretation None      MDM   Final diagnoses:  Sepsis  Elevated lactic acid level  Cellulitis of buttock  MRSA carrier  Hyponatremia  Leukocytosis   Nilda Riggs presents with weakness, confusion for the last several weeks and recently worsening. Concern for sepsis as patient has mild hypotension, elevated lactic and elevated white blood cell count.  2:45 PM Leukocytosis of 30,000, hyponatremia at 130, creatinine elevated at 1.54, mild anemia at 9.3. CT abdomen had to be performed without contrast as her GFR is less than 30.    It shows infiltration of fat along the right cleft of the buttock and this is likely cellulitis versus abscess.  Blood cultures pending. No evidence of UTI. Chest x-ray without evidence of pneumonia. Patient will need to be admitted. She's been  given vancomycin and Zosyn.  PCP: [facility]  3:01 PM Discussed with Junious Silk, PA-C of [facility] who will admit.    BP 113/40 mmHg  Pulse 78  Temp(Src) 97.6  F (36.4 C) (Oral)  Resp 24  Wt 131 lb (59.421 kg)  SpO2 100%   The patient was discussed with and seen by Dr. Gwendolyn Wheeler who agrees with the treatment plan.   Dahlia Client Muthersbaugh, PA-C 09/16/15 1504  Elwin Mocha, MD 09/16/15 1540

## 2015-09-16 NOTE — OR Nursing (Signed)
Jillian Wheeler is refusing placement of a foley catheter after multiple attempts of explanations.  HCPOA [name] (daughter) not in central waiting room and no phone number.  Jillian Wheeler said "Bring her in here and I will tell her in front of you, I don't want it."

## 2015-09-16 NOTE — Op Note (Signed)
09/16/2015  6:44 PM  PATIENT:  Nilda Riggs  81 y.o. female  PRE-OPERATIVE DIAGNOSIS:  Right buttock wound with abscess  POST-OPERATIVE DIAGNOSIS:  same  PROCEDURE:  Procedure(s): IRRIGATION AND DEBRIDEMENT AND DRAINAGE OR RIGHT BUTTOCK WOUND with scalpel  SURGEON:  Surgeon(s): Gaynelle Adu, MD  ASSISTANTS: none   ANESTHESIA:   LMA  DRAINS: Penrose drain in the thru the abscess cavity   LOCAL MEDICATIONS USED:  NONE  SPECIMEN:  Source of Specimen:  aerobic/anerobic cultures of right buttock wound  DISPOSITION OF SPECIMEN:  micro  COUNTS:  YES  INDICATION FOR PROCEDURE: This is an 81 yo white female who lives at [facility] independent living. The daughter went to visit the patient on Tuesday morning and she was asleep and minimally responsive. She thought it was her SIADH and took her to the PCP office. She had her CBC checked and WBC was 26K. She was found to have an "ulcer" on her right buttocks. She was given a shot of Rocephin at the office and put her on oral Cipro therapy at home. The PCP office wanted her admitted on Tuesday, but daughter wanted to stay with her at the nursing home. They went for follow up today and was told she needed to come to the ED for evaluation. WBC today is 30K. The daughter states she has had a lot of drainage from this site and she really feels like this has been going on for probably over a week. She denies anything else, except horrible back pain which is chronic and some shortness of breath. I discussed the risk and benefits of the procedure. Please see notes for additional detail  PROCEDURE: After obtaining informed consent the patient was taken to operating room 2, placed supine on the operating room table. General LMA anesthesia was established. She was then placed in a slight left lateral position with the right side up. Appropriate padding was placed. She received antibiotic's in the emergency room. A surgical timeout was  performed. Her sacrum and buttock & perirectal area were prepped with Betadine. She had an open wound along her right gluteal cleft. It measured 2 x 2 centimeters. There is a total area of 13 x 15 cm of cellulitis. I could easily express purulent drainage by pressing her right buttock. It appeared that the purulence tracked toward her feet toward her rectum. Additional rectal exam did not reveal any palpable mass other than old stool. I did not appreciate any fluctuance within the rectal canal or anal canal. I sharply excised and opened up the wound with a 15 blade. Total wound diameter mentioned for now 3 x 3 cm. Cultures were obtained. It appeared that some of the subcutaneous and soft tissue had become liquefied but otherwise it was intact and viable. The cavity did tracked downward along her medial left buttock toward her rectum. It tracked for about 7 cm. I was able to milk a lot of purulent drainage out. I decided to just make a counterincision more inferiorly with a 15 blade. I then threaded a Penrose through the open wound and brought it out through the counterincision. Since the overlying skin was intact and viable I did not feel the need to debride it. The cavity was irrigated with saline. Hemostasis was achieved. The ends of the Penrose were sutured to one another with 2 2-0 nylon sutures. The cavity was packed with 2 inch Curlex. 4 x 4's fluffs and mesh underwear were then applied. The patient was then placed  in the supine position and extubated and taken to the recovery room in stable condition. All needle, instrument, and sponge counts were correct 2 there are no immediate complications.   Tool used for debridement (curette, scapel, etc.)  scapel  Frequency of surgical debridement.   Initial debridement   Measurement of total devitalized tissue (wound surface) before and after surgical debridement.  2 x 2cm; 3 x3 cm  Area and depth of devitalized tissue removed from wound.  1.5 x 1.5 x 1  cm   Blood loss and description of tissue removed.  <25 mL; skin & liquified soft tissue   PLAN OF CARE: Admit to inpatient   PATIENT DISPOSITION:  PACU - hemodynamically stable.   Delay start of Pharmacological VTE agent (>24hrs) due to surgical blood loss or risk of bleeding:  no  Mary Sella. Andrey Campanile, MD, FACS General, Bariatric, & Minimally Invasive Surgery [facility], [place]

## 2015-09-16 NOTE — H&P (Signed)
[facility] History and Physical                                                                                    Jillian Wheeler, is a 81 y.o. female  MRN: [id number]   DOB - February 22, 1925  Admit Date - 09/16/2015  Outpatient Primary MD for the patient is Hoyle Sauer, MD  Referring MD: Gwendolyn Grant / ER  Consulting MD: General Surgery  With History of -  Past Medical History  Diagnosis Date  . Palpitations   . Unspecified fall   . Altered mental status   . Other nonspecific finding on examination of urine   . Symptomatic menopausal or female climacteric states   . Insomnia, unspecified   . Contact dermatitis and other eczema, due to unspecified cause   . Type II or unspecified type diabetes mellitus with neurological manifestations, not stated as uncontrolled   . Dermatophytosis of nail   . Mastodynia   . Contact dermatitis and other eczema due to solvents   . Chronic kidney disease, stage III (moderate)   . Acute bronchitis   . Wheezing   . Pain in joint, pelvic region and thigh   . Other speech disturbance(784.59)   . Unspecified acute reaction to stress   . Other malaise and fatigue   . Spasm of muscle   . Blood in stool   . Other specified noninflammatory disorder of vagina   . Osteoarthrosis, unspecified whether generalized or localized, unspecified site   . Gout, unspecified   . Adjustment disorder with mixed anxiety and depressed mood   . Other vitamin B12 deficiency anemia   . Obstructive sleep apnea (adult) (pediatric)   . Other dyspnea and respiratory abnormality   . Type II or unspecified type diabetes mellitus without mention of complication, not stated as uncontrolled   . Unspecified asthma(493.90)   . Esophageal reflux   . Other and unspecified hyperlipidemia   . Thyrotoxicosis without mention of goiter or other cause, without mention of thyrotoxic crisis or storm   . Unspecified essential hypertension   . 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 SIADH (syndrome  of inappropriate ADH production) 05/09/2015      Past Surgical History  Procedure Laterality Date  . Total knee arthroplasty Right   . Abdominal hysterectomy      still has ovaries  . Appendectomy    . Cervical fusion    . Lumbar spine surgery      in for   Chief Complaint  Patient presents with  . Abdominal Pain     HPI This is a 81 year old female patient resident of assisted living facility with a history of recurrent hyponatremia, obstructive sleep apnea, chronic diastolic heart failure, dementia,  significant hearing loss dyslipidemia, GERD, anemia, asthma and recurrent UTIs. Patient had recently been treated for the past 2 days with oral antibiotics for presumed urinary tract infection. At that time she had been evaluated by her primary care physician on 9/27 and was found to have a white cell count of 26,000 and was given intramuscular Cipro. Recheck today at the nursing facility WBC was 25,500. Patient continued to complain of lower abdominal pain which  is also a chronic issue since February 2016. She has experienced chronic constipation despite being given enemas and other therapies. Patient also takes tramadol and hydrocodone for chronic pain.  In the ER patient was afebrile, blood pressure was 124/45 with MAP of 66, pulse was 77 and regular, room air saturations were 100%. Laboratory data revealed a white count of 30,500 no differential was completed, lactic as was elevated 2.48, sodium was 1:30, BUN was 32 and creatinine was 1.54 with baseline renal function of the UNI and creatinine 0.81. On exam patient was found to have a right gluteal abscess draining large volume purulent material. He to complaints of abdominal pain a CT of the abdomen and pelvis was completed which revealed infiltration of fat along the right cleft of the buttock measuring 4.4 cm x 6.0 cm. Chest x-ray was unremarkable. Sepsis protocol was initiated and patient was given a total of 2500 mL volume resuscitation  and started on broad-spectrum empiric antibody exquisite blood and urine cultures obtained. Wound cultures were not obtained. While in the ER patient's blood pressure dipped to a low of 108/39. She did have some tachypnea but her daughter reports this is chronic because of chronic shallow respiratory effort. Her MAP also dropped below 65 for several readings but did improve after volume resuscitation. A shunt was also given vancomycin and Zosyn in the ER.   Review of Systems   In addition to the HPI above,  No Fever or myalgias or other constitutional symptoms No Headache, changes with Vision or hearing, new weakness, tingling, numbness in any extremity, No problems swallowing food or Liquids, indigestion/reflux No Chest pain, Cough or Shortness of Breath, palpitations, orthopnea or DOE No melena or hematochezia, no dark tarry stools, Bowel movements are regular, No hematuria or flank pain No new skin rashes, lesions, masses or bruises, No new joints pains-aches No recent weight gain or loss No polyuria, polydypsia or polyphagia,  *A full 10 point Review of Systems was done, except as stated above, all other Review of Systems were negative.  Social History Social History  Substance Use Topics  . Smoking status: Passive Smoke Exposure - Never Smoker  . Smokeless tobacco: Not on file  . Alcohol Use: Yes     Comment: very rare    Resides at: [facility]  Lives with: Alone  Ambulatory status: Utilizes a rolling walker   Family History Family History  Problem Relation Age of Onset  . Cancer Sister   . Osteoarthritis Mother   . Heart disease    . Stroke Sister      Prior to Admission medications   Medication Sig Start Date End Date Taking? Authorizing Provider  acetaminophen (TYLENOL) 325 MG tablet Take 325 mg by mouth at bedtime. To be taken with Tramadol   Yes Historical Provider, MD  albuterol (PROVENTIL) (2.5 MG/3ML) 0.083% nebulizer solution Take 3  mLs (2.5 mg total) by nebulization every 2 (two) hours as needed for wheezing. 05/13/15  Yes Kathlen Mody, MD  allopurinol (ZYLOPRIM) 100 MG tablet Take 100 mg by mouth daily.  02/19/14  Yes Historical Provider, MD  amLODipine (NORVASC) 5 MG tablet Take 1 tablet (5 mg total) by mouth daily. 03/09/15  Yes Catarina Hartshorn, MD  atorvastatin (LIPITOR) 20 MG tablet Take 20 mg by mouth daily.  11/25/13  Yes Historical Provider, MD  bisacodyl (DULCOLAX) 10 MG suppository Place 10 mg rectally 2 (two) times daily.   Yes Historical Provider, MD  bisacodyl (DULCOLAX) 5 MG EC  tablet Take 5 mg by mouth 2 (two) times daily.   Yes Historical Provider, MD  Calcium Carbonate-Vitamin D (CALTRATE 600+D) 600-400 MG-UNIT per tablet Take 1 tablet by mouth daily. At lunch and supper   Yes Historical Provider, MD  ciprofloxacin (CIPRO) 500 MG tablet Take 500 mg by mouth 2 (two) times daily.   Yes Historical Provider, MD  clonazePAM (KLONOPIN) 1 MG tablet 2 by mouth every morning , 1 by mouth every night at bedtime Patient taking differently: Take 1 mg by mouth at bedtime.  03/17/15  Yes Kimber Relic, MD  cyanocobalamin (,VITAMIN B-12,) 1000 MCG/ML injection Inject 1,000 mcg into the muscle every 30 (thirty) days. On 28th   Yes Historical Provider, MD  fluticasone (FLONASE) 50 MCG/ACT nasal spray Place 1 spray into both nostrils daily as needed for allergies or rhinitis.   Yes Historical Provider, MD  levothyroxine (SYNTHROID, LEVOTHROID) 88 MCG tablet Take 88 mcg by mouth daily before breakfast.   Yes Historical Provider, MD  Melatonin 5 MG CAPS Take 5 mg by mouth at bedtime.    Yes Historical Provider, MD  Multiple Vitamins-Minerals (MULTIVITAMIN ADULTS 50+ PO) Take 1 capsule by mouth daily.   Yes Historical Provider, MD  Omega-3-acid Ethyl Esters (LOVAZA PO) Take 1,200 mg by mouth daily.   Yes Historical Provider, MD  Probiotic Product (PHILLIPS COLON HEALTH PO) Take 1 capsule by mouth daily as needed (constipation).   Yes  Historical Provider, MD  traMADol (ULTRAM) 50 MG tablet Take 1 tablet (50 mg total) by mouth 3 (three) times daily. Patient taking differently: Take 50 mg by mouth 2 (two) times daily.  05/13/15  Yes Kathlen Mody, MD  triamcinolone cream (KENALOG) 0.1 % Apply 1 application topically 2 (two) times daily as needed (irritation).   Yes Historical Provider, MD  sodium chloride 1 G tablet Take 1 tablet (1 g total) by mouth 3 (three) times daily with meals. Patient not taking: Reported on 09/16/2015 05/13/15   Kathlen Mody, MD  traMADol-acetaminophen (ULTRACET) 37.5-325 MG per tablet Take 1 tablet by mouth every 6 (six) hours as needed for moderate pain or severe pain. And 1 tablet by mouth nightly at 8pm Patient not taking: Reported on 09/16/2015 05/13/15   Kathlen Mody, MD    Allergies  Allergen Reactions  . Aspirin Other (See Comments)    Upset stomach  . Celecoxib Other (See Comments)    Upset stomach  . Diclofenac Sodium Other (See Comments)    Pt does not remember   . Ibuprofen Other (See Comments)    Upset stomach   . Naproxen Other (See Comments)    Upset stomach   . Sulindac Other (See Comments)    Pt does not remember     Physical Exam  Vitals  Blood pressure 121/49, pulse 83, temperature 97.6 F (36.4 C), temperature source Oral, resp. rate 17, weight 131 lb (59.421 kg), SpO2 96 %.   General:  In acute distress as evidenced by anxiety and persistent reports of feeling cold and also seems somewhat confused  Psych:  Normal affect, exam limited by patient's hearing loss   Neuro:   No focal neurological deficits, CN II through XII intact, Strength 4/5 all 4 extremities, Sensation intact all 4 extremities.  ENT:  Ears and Eyes appear Normal, Conjunctivae clear, PER. Dry oral mucosa without erythema or exudates.  Neck:  Supple, No lymphadenopathy appreciated  Respiratory:  Symmetrical chest wall movement, Good air movement bilaterally, CTAB. Room Air  Cardiac:  RRR, No  Murmurs, no LE edema noted, no JVD, No carotid bruits, peripheral pulses palpable at 2+; blood pressure suboptimal for this patient-MAP has been less than 65 for several readings right or to volume resuscitation  Abdomen:  Positive bowel sounds, Soft, tender or suprapubic region without guarding or rebounding, Non distended,  No masses appreciated, no obvious hepatosplenomegaly  Skin:  No Cyanosis, Normal Skin Turgor, No Skin Rash or Bruise. Patient has large area measuring about 5-6 cm in diameter over right upper gluteal fold adjacent to the rectum and lower sacrum-this area was spontaneously open at time of presentation to the ER and with gentle palpation over the reddened area a large amount of pale yellow mucopurulent drainage was expressed-this area is also quite tender to palpation; because of increased pain I did not attempt a rectal exam  Extremities: Symmetrical without obvious trauma or injury,  no effusions.  Data Review  CBC  Recent Labs Lab 09/16/15 1232  WBC 30.5*  HGB 9.3*  HCT 27.5*  PLT 351  MCV 99.3  MCH 33.6  MCHC 33.8  RDW 13.7    Chemistries   Recent Labs Lab 09/16/15 1232 09/16/15 1535  NA 130* 130*  K 4.5 4.2  CL 99* 105  CO2 22 20*  GLUCOSE 107* 125*  BUN 32* 28*  CREATININE 1.54* 1.22*  CALCIUM 9.9 8.7*  AST 40  --   ALT 35  --   ALKPHOS 144*  --   BILITOT 0.4  --     estimated creatinine clearance is 25.2 mL/min (by C-G formula based on Cr of 1.22).  No results for input(s): TSH, T4TOTAL, T3FREE, THYROIDAB in the last 72 hours.  Invalid input(s): FREET3  Coagulation profile No results for input(s): INR, PROTIME in the last 168 hours.  No results for input(s): DDIMER in the last 72 hours.  Cardiac Enzymes No results for input(s): CKMB, TROPONINI, MYOGLOBIN in the last 168 hours.  Invalid input(s): CK  Invalid input(s): POCBNP  Urinalysis    Component Value Date/Time   COLORURINE YELLOW 09/16/2015 1350   APPEARANCEUR CLEAR  09/16/2015 1350   LABSPEC 1.017 09/16/2015 1350   PHURINE 5.0 09/16/2015 1350   GLUCOSEU NEGATIVE 09/16/2015 1350   HGBUR NEGATIVE 09/16/2015 1350   BILIRUBINUR NEGATIVE 09/16/2015 1350   KETONESUR NEGATIVE 09/16/2015 1350   PROTEINUR NEGATIVE 09/16/2015 1350   UROBILINOGEN 0.2 09/16/2015 1350   NITRITE NEGATIVE 09/16/2015 1350   LEUKOCYTESUR NEGATIVE 09/16/2015 1350    Imaging results:   Ct Abdomen Pelvis Wo Contrast  09/16/2015   CLINICAL DATA:  Increasing weakness over the past 14 days. Intermittent lower abdominal pain since February, 2016. Constipation. Urinary tract infection. Elevated white blood cell count. Subsequent encounter.  EXAM: CT ABDOMEN AND PELVIS WITHOUT CONTRAST  TECHNIQUE: Multidetector CT imaging of the abdomen and pelvis was performed following the standard protocol without IV contrast.  COMPARISON:  CT abdomen and pelvis 05/06/2015 and 12/23/2014.  FINDINGS: Hiatal hernia is noted. No pleural or pericardial effusion. Heart size is upper normal. Dependent atelectasis is seen lung bases.  A few small stones are seen in the gallbladder. The liver, spleen, adrenal glands and pancreas are unremarkable. There is cortical atrophy and kidneys. Small cyst in the upper pole of the left kidney is again seen, unchanged.  There a new area of increased attenuation in subcutaneous fat along the right cleft of the buttock. Area of involvement measures 4.4 cm transverse by 6.0 cm AP by approximately 6.5 cm craniocaudal. Gas in  subcutaneous fat is seen the left buttock possibly from prior injection.  The patient is status post hysterectomy. Patient's sigmoid diverticulosis in an otherwise unremarkable: The patient appears to be status post appendectomy. The small bowel is unremarkable. No lymphadenopathy, free air or intra-abdominal focal fluid collection is identified. Fat containing umbilical hernia is unchanged.  Bones demonstrate a fracture of L5 where the patient is status post vertebral  augmentation. Methylmethacrylate extends into the right lateral recess and foramen and the L4-5 disc interspace, unchanged. No acute fracture is seen. The patient has convex left lumbar scoliosis and multilevel spondylosis.  IMPRESSION: Infiltration of fat along the right cleft of the buttock is nonspecific but could be due to contusion or phlegmon and cellulitis. This area should be amenable to direct visual inspection.  No acute finding in the abdomen or pelvis.  Gallstones without evidence of cholecystitis.  Hiatal hernia.  Atherosclerosis.  Lumbar spondylosis. Status post L5 vertebral augmentation, unchanged in appearance.   Electronically Signed   By: Drusilla Kanner M.D.   On: 09/16/2015 14:38   Dg Chest Port 1 View  09/16/2015   CLINICAL DATA:  Sepsis.  Weakness.  Initial encounter.  EXAM: PORTABLE CHEST 1 VIEW  COMPARISON:  03/03/2015.  FINDINGS: Cardiopericardial silhouette within normal limits. Mediastinal contours normal. Trachea midline. No airspace disease or effusion. Lung volumes are slightly low. Monitoring leads project over the chest.  IMPRESSION: No active disease.   Electronically Signed   By: Andreas Newport M.D.   On: 09/16/2015 13:33     EKG: (Independently reviewed) sinus rhythm with ventricular rate 77 bpm, QTC 467 ms, borderline voltage criteria for LVH, no ST segment or T-wave depression consistent with ischemic changes   Assessment & Plan  Principal Problem:   Sepsis affecting skin/right gluteal abscess -Admit to stepdown -Gen. surgery consulted-uncertain if would benefit from surgical I/D -Continue empiric broad-spectrum antibiotics -Continue volume resuscitation with normal saline at 150 mL per hour after an additional 500 mL normal saline bolus -Check cortisol and Procalcitonin -Follow up on blood cultures -Reported with UTI 2 days prior and current urinalysis not abnormal but will check urine culture as precaution since did receive antibiotics prior to  arrival -Lactic acid has been cycled and second lactic acid has normalized after volume resuscitation and administration of antibiotics -MAP has also improved after volume resuscitation and we'll continue to monitor -Patient has sepsis physiology as follows: MAP less than 65, WBCs greater than 14,000, definitive source of infection, organ dysfunction as manifested by acute renal failure, altered mentation as manifested by confusion, patient has chronic tachypnea so not a reliable parameter -NPO till evaluated by surgery -Has poor venous access so stat PICC line requested  Active Problems:   Dehydration with hyponatremia -Continue volume resuscitation -Follow labs    Acute renal failure -Baseline renal function BUN 9 with creatinine 0.81 -Current renal function BUN 32 and creatinine 1.54 -Continue volume resuscitation and treat underlying sepsis physiology and follow-up labs in a.m.    OSA (obstructive sleep apnea) -On after for nocturnal hypoxemia    DIASTOLIC HEART FAILURE, CHRONIC -Currently appears compensated based on chest x-ray and clinical exam -Monitor for volume overload with volume resuscitation    Hypertension -In setting of acute sepsis physiology with suboptimal blood pressures will hold Norvasc    Constipation -Chronic recurrent problem for this patient -Although not mentioned on CT when films were reviewed patient did have retained stool in the rectum -Once decision made regarding management of gluteal abscess will need  to focus on evacuation of stools with laxatives and stool softeners and/or enemas as necessary    Hyperlipidemia -On Lipitor prior to admission    Hypothyroidism -Continue Synthroid but utilize IV route    Anemia -Hemoglobin stable at 9.3 and near baseline of between 9.5 and 10.6    DVT Prophylaxis: Subcutaneous heparin  Family Communication:  Daughter at bedside   Code Status:  DO NOT RESUSCITATE-daughter okay with treating current  reversible illnesses including proceeding with surgical intervention if necessary  Condition:  Guarded  Discharge disposition: Given likelihood of wound that will need skilled nursing care at time of discharge anticipate patient may need to discharge at least temporarily to skilled nursing facility in medically stable  Time spent in minutes : 60      ELLIS,ALLISON L. ANP on 09/16/2015 at 4:28 PM  Between 7am to 7pm - Pager - [phone]  After 7pm go to [email or web address] - password [email or web address]  And look for the night coverage person covering me after hours  [facility]

## 2015-09-16 NOTE — Anesthesia Postprocedure Evaluation (Signed)
  Anesthesia Post-op Note  Patient: Jillian Wheeler  Procedure(s) Performed: Procedure(s): IRRIGATION AND DEBRIDEMENT AND DRAINAGE OR RIGHT BUTTOCK WOUND  (N/A)  Patient Location: PACU  Anesthesia Type:General  Level of Consciousness: awake and alert   Airway and Oxygen Therapy: Patient Spontanous Breathing  Post-op Pain: mild  Post-op Assessment: Post-op Vital signs reviewed              Post-op Vital Signs: Reviewed  Last Vitals:  Filed Vitals:   09/16/15 2030  BP: 126/51  Pulse: 69  Temp: 36.2 C  Resp: 21    Complications: No apparent anesthesia complications

## 2015-09-16 NOTE — Consult Note (Signed)
Jillian Wheeler Aug 24, 1925  [id number].   Primary Care MD: Dr. Dagmar Hait Requesting MD: Dr. Allyson Sabal Chief Complaint/Reason for Consult: buttock abscess HPI:  This is an 81 yo white female who lives at [facility] independent living. The daughter went to visit the patient on Tuesday morning and she was asleep and minimally responsive.  She thought it was her SIADH and took her to the PCP office.  She had her CBC checked and WBC was 26K.  She was found to have an "ulcer" on her right buttocks.  She was given a shot of Rocephin at the office and put her on oral Cipro therapy at home.  The PCP office wanted her admitted on Tuesday, but daughter wanted to stay with her at the nursing home.  They went for follow up today and was told she needed to come to the ED for evaluation.  WBC today is 30K.  The daughter states she has had a lot of drainage from this site and she really feels like this has been going on for probably over a week.  She denies anything else, except horrible back pain which is chronic and some shortness of breath.  Review of Systems  Constitutional: Positive for chills and malaise/fatigue. Negative for fever and weight loss.  HENT: Negative for congestion and ear pain.   Eyes: Negative for pain.  Respiratory: Positive for shortness of breath. Negative for cough.   Cardiovascular: Negative for chest pain and palpitations.  Gastrointestinal: Negative for nausea and abdominal pain.  Genitourinary: Negative for dysuria.  Musculoskeletal: Positive for back pain. Negative for myalgias.  Neurological: Negative for dizziness, sensory change and headaches.  : Please see HPI, otherwise negative  Family History  Problem Relation Age of Onset  . Cancer, thyroid and lymphoma Sister   . Osteoarthritis Mother   . Heart disease Sister, father   . Stroke Sister, father     Past Medical History  Diagnosis Date  . Palpitations   . Unspecified fall   . Altered mental status   . Other nonspecific  finding on examination of urine   . Symptomatic menopausal or female climacteric states   . Insomnia, unspecified   . Contact dermatitis and other eczema, due to unspecified cause   . Type II or unspecified type diabetes mellitus with neurological manifestations, not stated as uncontrolled   . Dermatophytosis of nail   . Mastodynia   . Contact dermatitis and other eczema due to solvents   . Chronic kidney disease, stage III (moderate)   . Acute bronchitis   . Wheezing   . Pain in joint, pelvic region and thigh   . Other speech disturbance(784.59)   . Unspecified acute reaction to stress   . Other malaise and fatigue   . Spasm of muscle   . Blood in stool   . Other specified noninflammatory disorder of vagina   . Osteoarthrosis, unspecified whether generalized or localized, unspecified site   . Gout, unspecified   . Adjustment disorder with mixed anxiety and depressed mood   . Other vitamin B12 deficiency anemia   . Obstructive sleep apnea (adult) (pediatric)   . Other dyspnea and respiratory abnormality   . Type II or unspecified type diabetes mellitus without mention of complication, not stated as uncontrolled   . Unspecified asthma(493.90)   . Esophageal reflux   . Other and unspecified hyperlipidemia   . Thyrotoxicosis without mention of goiter or other cause, without mention of thyrotoxic crisis or storm   .  Unspecified essential hypertension   . 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 SIADH (syndrome of inappropriate ADH production) 05/09/2015    Past Surgical History  Procedure Laterality Date  . Total knee arthroplasty Right   . Abdominal hysterectomy      still has ovaries  . Appendectomy    . Cervical fusion    . Lumbar spine surgery      Social History:  reports that she has been passively smoking.  She does not have any smokeless tobacco history on file. She reports that she drinks alcohol. She reports that she does not use illicit drugs.  Allergies:  Allergies  Allergen  Reactions  . Aspirin Other (See Comments)    Upset stomach  . Celecoxib Other (See Comments)    Upset stomach  . Diclofenac Sodium Other (See Comments)    Pt does not remember   . Ibuprofen Other (See Comments)    Upset stomach   . Naproxen Other (See Comments)    Upset stomach   . Sulindac Other (See Comments)    Pt does not remember      (Not in a hospital admission)  Blood pressure 107/71, pulse 77, temperature 97.6 F (36.4 C), temperature source Oral, resp. rate 17, weight 59.421 kg (131 lb), SpO2 98 %. Physical Exam: General: elderly ill appearing white female who is laying in bed in NAD HEENT: head has a tshirt wrapped around it due to being cold.  Sclera are noninjected.  PERRL.  Ears and nose without any masses or lesions.  Mouth is pink and moist Heart: regular, rate, and rhythm.  Normal s1,s2. No obvious murmurs, gallops, or rubs noted.  Palpable radial and pedal pulses bilaterally Lungs: CTAB, no wheezes, rhonchi, or rales noted.  Respiratory effort nonlabored Abd: soft, NT, ND, +BS, no masses or organomegaly, soft reducible umbilical hernia MS: all 4 extremities are symmetrical with no cyanosis, clubbing, or edema. Skin: warm and dry with no masses, lesions, or rashes; however she has an area on her right buttock that is a 1x1cm in size with copious amounts of purulent drainage.  There is no surrounding cellulitis or erythema.  There is fibrinous tissue visible at this spontaneous opening.  Superior and more lateral to this opening is a rather large area of induration noted, but not cellulitic changes there either.  Inferior to this open is a soft more fluctuant area that purulent drainage can be milked from. Psych: A&Ox3 with an appropriately affect.    Results for orders placed or performed during the hospital encounter of 09/16/15 (from the past 48 hour(s))  Lipase, blood     Status: Abnormal   Collection Time: 09/16/15 12:32 PM  Result Value Ref Range   Lipase  13 (L) 22 - 51 U/L  Comprehensive metabolic panel     Status: Abnormal   Collection Time: 09/16/15 12:32 PM  Result Value Ref Range   Sodium 130 (L) 135 - 145 mmol/L   Potassium 4.5 3.5 - 5.1 mmol/L   Chloride 99 (L) 101 - 111 mmol/L   CO2 22 22 - 32 mmol/L   Glucose, Bld 107 (H) 65 - 99 mg/dL   BUN 32 (H) 6 - 20 mg/dL   Creatinine, Ser 1.54 (H) 0.44 - 1.00 mg/dL   Calcium 9.9 8.9 - 10.3 mg/dL   Total Protein 5.3 (L) 6.5 - 8.1 g/dL   Albumin 1.8 (L) 3.5 - 5.0 g/dL   AST 40 15 - 41 U/L   ALT 35 14 -  54 U/L   Alkaline Phosphatase 144 (H) 38 - 126 U/L   Total Bilirubin 0.4 0.3 - 1.2 mg/dL   GFR calc non Af Amer 29 (L) >60 mL/min   GFR calc Af Amer 34 (L) >60 mL/min    Comment: (NOTE) The eGFR has been calculated using the CKD EPI equation. This calculation has not been validated in all clinical situations. eGFR's persistently <60 mL/min signify possible Chronic Kidney Disease.    Anion gap 9 5 - 15  CBC     Status: Abnormal   Collection Time: 09/16/15 12:32 PM  Result Value Ref Range   WBC 30.5 (H) 4.0 - 10.5 K/uL   RBC 2.77 (L) 3.87 - 5.11 MIL/uL   Hemoglobin 9.3 (L) 12.0 - 15.0 g/dL   HCT 27.5 (L) 36.0 - 46.0 %   MCV 99.3 78.0 - 100.0 fL   MCH 33.6 26.0 - 34.0 pg   MCHC 33.8 30.0 - 36.0 g/dL   RDW 13.7 11.5 - 15.5 %   Platelets 351 150 - 400 K/uL  I-Stat CG4 Lactic Acid, ED     Status: Abnormal   Collection Time: 09/16/15 12:45 PM  Result Value Ref Range   Lactic Acid, Venous 2.48 (HH) 0.5 - 2.0 mmol/L   Comment NOTIFIED PHYSICIAN   Urinalysis, Routine w reflex microscopic (not at [facility])     Status: None   Collection Time: 09/16/15  1:50 PM  Result Value Ref Range   Color, Urine YELLOW YELLOW   APPearance CLEAR CLEAR   Specific Gravity, Urine 1.017 1.005 - 1.030   pH 5.0 5.0 - 8.0   Glucose, UA NEGATIVE NEGATIVE mg/dL   Hgb urine dipstick NEGATIVE NEGATIVE   Bilirubin Urine NEGATIVE NEGATIVE   Ketones, ur NEGATIVE NEGATIVE mg/dL   Protein, ur NEGATIVE NEGATIVE  mg/dL   Urobilinogen, UA 0.2 0.0 - 1.0 mg/dL   Nitrite NEGATIVE NEGATIVE   Leukocytes, UA NEGATIVE NEGATIVE    Comment: MICROSCOPIC NOT DONE ON URINES WITH NEGATIVE PROTEIN, BLOOD, LEUKOCYTES, NITRITE, OR GLUCOSE <1000 mg/dL.  I-Stat CG4 Lactic Acid, ED     Status: None   Collection Time: 09/16/15  3:39 PM  Result Value Ref Range   Lactic Acid, Venous 1.19 0.5 - 2.0 mmol/L   Ct Abdomen Pelvis Wo Contrast  09/16/2015   CLINICAL DATA:  Increasing weakness over the past 14 days. Intermittent lower abdominal pain since February, 2016. Constipation. Urinary tract infection. Elevated white blood cell count. Subsequent encounter.  EXAM: CT ABDOMEN AND PELVIS WITHOUT CONTRAST  TECHNIQUE: Multidetector CT imaging of the abdomen and pelvis was performed following the standard protocol without IV contrast.  COMPARISON:  CT abdomen and pelvis 05/06/2015 and 12/23/2014.  FINDINGS: Hiatal hernia is noted. No pleural or pericardial effusion. Heart size is upper normal. Dependent atelectasis is seen lung bases.  A few small stones are seen in the gallbladder. The liver, spleen, adrenal glands and pancreas are unremarkable. There is cortical atrophy and kidneys. Small cyst in the upper pole of the left kidney is again seen, unchanged.  There a new area of increased attenuation in subcutaneous fat along the right cleft of the buttock. Area of involvement measures 4.4 cm transverse by 6.0 cm AP by approximately 6.5 cm craniocaudal. Gas in subcutaneous fat is seen the left buttock possibly from prior injection.  The patient is status post hysterectomy. Patient's sigmoid diverticulosis in an otherwise unremarkable: The patient appears to be status post appendectomy. The small bowel is unremarkable. No lymphadenopathy, free  air or intra-abdominal focal fluid collection is identified. Fat containing umbilical hernia is unchanged.  Bones demonstrate a fracture of L5 where the patient is status post vertebral augmentation.  Methylmethacrylate extends into the right lateral recess and foramen and the L4-5 disc interspace, unchanged. No acute fracture is seen. The patient has convex left lumbar scoliosis and multilevel spondylosis.  IMPRESSION: Infiltration of fat along the right cleft of the buttock is nonspecific but could be due to contusion or phlegmon and cellulitis. This area should be amenable to direct visual inspection.  No acute finding in the abdomen or pelvis.  Gallstones without evidence of cholecystitis.  Hiatal hernia.  Atherosclerosis.  Lumbar spondylosis. Status post L5 vertebral augmentation, unchanged in appearance.   Electronically Signed   By: Inge Rise M.D.   On: 09/16/2015 14:38   Dg Chest Port 1 View  09/16/2015   CLINICAL DATA:  Sepsis.  Weakness.  Initial encounter.  EXAM: PORTABLE CHEST 1 VIEW  COMPARISON:  03/03/2015.  FINDINGS: Cardiopericardial silhouette within normal limits. Mediastinal contours normal. Trachea midline. No airspace disease or effusion. Lung volumes are slightly low. Monitoring leads project over the chest.  IMPRESSION: No active disease.   Electronically Signed   By: Dereck Ligas M.D.   On: 09/16/2015 13:33       Assessment/Plan 1. Sepsis -per medicine 2. Right gluteal abscess -there is no evidence of cellulitic changes over this spontaneous opening, but clearly she has copious amounts of purulent drainage and likely further infection that needs to be unroofed. -remain NPO -Dr. Hulen Skains evaluating currently.  She will likely need to go to the OR tonight for incision and drainage of this area -agree with abx therapy 3. Multiple other medical problems -will defer to medical service  OSBORNE,KELLY E 09/16/2015, 3:47 PM Pager: [phone]

## 2015-09-16 NOTE — Transfer of Care (Signed)
Immediate Anesthesia Transfer of Care Note  Patient: Jillian Wheeler  Procedure(s) Performed: Procedure(s): IRRIGATION AND DEBRIDEMENT AND DRAINAGE OR RIGHT BUTTOCK WOUND  (N/A)  Patient Location: PACU  Anesthesia Type:General  Level of Consciousness: sedated and patient cooperative  Airway & Oxygen Therapy: Patient Spontanous Breathing and Patient connected to nasal cannula oxygen  Post-op Assessment: Report given to RN and Post -op Vital signs reviewed and stable  Post vital signs: Reviewed and stable  Last Vitals:  Filed Vitals:   09/16/15 1720  BP: 115/42  Pulse: 80  Temp:   Resp: 22    Complications: No apparent anesthesia complications

## 2015-09-16 NOTE — ED Notes (Signed)
Consent signed by pts daughter, pts daughter has all pt belongings, pts daughter will remove hearing aids and glasses in short stay. Pt transported to short stay and report given to RN.

## 2015-09-16 NOTE — H&P (View-Only) (Signed)
Jillian Wheeler 1925/10/23  [id number].   Primary Care MD: Dr. Dagmar Hait Requesting MD: Dr. Allyson Sabal Chief Complaint/Reason for Consult: buttock abscess HPI:  This is an 81 yo white female who lives at [facility] independent living. The daughter went to visit the patient on Tuesday morning and she was asleep and minimally responsive.  She thought it was her SIADH and took her to the PCP office.  She had her CBC checked and WBC was 26K.  She was found to have an "ulcer" on her right buttocks.  She was given a shot of Rocephin at the office and put her on oral Cipro therapy at home.  The PCP office wanted her admitted on Tuesday, but daughter wanted to stay with her at the nursing home.  They went for follow up today and was told she needed to come to the ED for evaluation.  WBC today is 30K.  The daughter states she has had a lot of drainage from this site and she really feels like this has been going on for probably over a week.  She denies anything else, except horrible back pain which is chronic and some shortness of breath.  Review of Systems  Constitutional: Positive for chills and malaise/fatigue. Negative for fever and weight loss.  HENT: Negative for congestion and ear pain.   Eyes: Negative for pain.  Respiratory: Positive for shortness of breath. Negative for cough.   Cardiovascular: Negative for chest pain and palpitations.  Gastrointestinal: Negative for nausea and abdominal pain.  Genitourinary: Negative for dysuria.  Musculoskeletal: Positive for back pain. Negative for myalgias.  Neurological: Negative for dizziness, sensory change and headaches.  : Please see HPI, otherwise negative  Family History  Problem Relation Age of Onset  . Cancer, thyroid and lymphoma Sister   . Osteoarthritis Mother   . Heart disease Sister, father   . Stroke Sister, father     Past Medical History  Diagnosis Date  . Palpitations   . Unspecified fall   . Altered mental status   . Other nonspecific  finding on examination of urine   . Symptomatic menopausal or female climacteric states   . Insomnia, unspecified   . Contact dermatitis and other eczema, due to unspecified cause   . Type II or unspecified type diabetes mellitus with neurological manifestations, not stated as uncontrolled   . Dermatophytosis of nail   . Mastodynia   . Contact dermatitis and other eczema due to solvents   . Chronic kidney disease, stage III (moderate)   . Acute bronchitis   . Wheezing   . Pain in joint, pelvic region and thigh   . Other speech disturbance(784.59)   . Unspecified acute reaction to stress   . Other malaise and fatigue   . Spasm of muscle   . Blood in stool   . Other specified noninflammatory disorder of vagina   . Osteoarthrosis, unspecified whether generalized or localized, unspecified site   . Gout, unspecified   . Adjustment disorder with mixed anxiety and depressed mood   . Other vitamin B12 deficiency anemia   . Obstructive sleep apnea (adult) (pediatric)   . Other dyspnea and respiratory abnormality   . Type II or unspecified type diabetes mellitus without mention of complication, not stated as uncontrolled   . Unspecified asthma(493.90)   . Esophageal reflux   . Other and unspecified hyperlipidemia   . Thyrotoxicosis without mention of goiter or other cause, without mention of thyrotoxic crisis or storm   .  Unspecified essential hypertension   . 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 SIADH (syndrome of inappropriate ADH production) 05/09/2015    Past Surgical History  Procedure Laterality Date  . Total knee arthroplasty Right   . Abdominal hysterectomy      still has ovaries  . Appendectomy    . Cervical fusion    . Lumbar spine surgery      Social History:  reports that she has been passively smoking.  She does not have any smokeless tobacco history on file. She reports that she drinks alcohol. She reports that she does not use illicit drugs.  Allergies:  Allergies  Allergen  Reactions  . Aspirin Other (See Comments)    Upset stomach  . Celecoxib Other (See Comments)    Upset stomach  . Diclofenac Sodium Other (See Comments)    Pt does not remember   . Ibuprofen Other (See Comments)    Upset stomach   . Naproxen Other (See Comments)    Upset stomach   . Sulindac Other (See Comments)    Pt does not remember      (Not in a hospital admission)  Blood pressure 107/71, pulse 77, temperature 97.6 F (36.4 C), temperature source Oral, resp. rate 17, weight 59.421 kg (131 lb), SpO2 98 %. Physical Exam: General: elderly ill appearing white female who is laying in bed in NAD HEENT: head has a tshirt wrapped around it due to being cold.  Sclera are noninjected.  PERRL.  Ears and nose without any masses or lesions.  Mouth is pink and moist Heart: regular, rate, and rhythm.  Normal s1,s2. No obvious murmurs, gallops, or rubs noted.  Palpable radial and pedal pulses bilaterally Lungs: CTAB, no wheezes, rhonchi, or rales noted.  Respiratory effort nonlabored Abd: soft, NT, ND, +BS, no masses or organomegaly, soft reducible umbilical hernia MS: all 4 extremities are symmetrical with no cyanosis, clubbing, or edema. Skin: warm and dry with no masses, lesions, or rashes; however she has an area on her right buttock that is a 1x1cm in size with copious amounts of purulent drainage.  There is no surrounding cellulitis or erythema.  There is fibrinous tissue visible at this spontaneous opening.  Superior and more lateral to this opening is a rather large area of induration noted, but not cellulitic changes there either.  Inferior to this open is a soft more fluctuant area that purulent drainage can be milked from. Psych: A&Ox3 with an appropriately affect.    Results for orders placed or performed during the hospital encounter of 09/16/15 (from the past 48 hour(s))  Lipase, blood     Status: Abnormal   Collection Time: 09/16/15 12:32 PM  Result Value Ref Range   Lipase  13 (L) 22 - 51 U/L  Comprehensive metabolic panel     Status: Abnormal   Collection Time: 09/16/15 12:32 PM  Result Value Ref Range   Sodium 130 (L) 135 - 145 mmol/L   Potassium 4.5 3.5 - 5.1 mmol/L   Chloride 99 (L) 101 - 111 mmol/L   CO2 22 22 - 32 mmol/L   Glucose, Bld 107 (H) 65 - 99 mg/dL   BUN 32 (H) 6 - 20 mg/dL   Creatinine, Ser 1.54 (H) 0.44 - 1.00 mg/dL   Calcium 9.9 8.9 - 10.3 mg/dL   Total Protein 5.3 (L) 6.5 - 8.1 g/dL   Albumin 1.8 (L) 3.5 - 5.0 g/dL   AST 40 15 - 41 U/L   ALT 35 14 -  54 U/L   Alkaline Phosphatase 144 (H) 38 - 126 U/L   Total Bilirubin 0.4 0.3 - 1.2 mg/dL   GFR calc non Af Amer 29 (L) >60 mL/min   GFR calc Af Amer 34 (L) >60 mL/min    Comment: (NOTE) The eGFR has been calculated using the CKD EPI equation. This calculation has not been validated in all clinical situations. eGFR's persistently <60 mL/min signify possible Chronic Kidney Disease.    Anion gap 9 5 - 15  CBC     Status: Abnormal   Collection Time: 09/16/15 12:32 PM  Result Value Ref Range   WBC 30.5 (H) 4.0 - 10.5 K/uL   RBC 2.77 (L) 3.87 - 5.11 MIL/uL   Hemoglobin 9.3 (L) 12.0 - 15.0 g/dL   HCT 27.5 (L) 36.0 - 46.0 %   MCV 99.3 78.0 - 100.0 fL   MCH 33.6 26.0 - 34.0 pg   MCHC 33.8 30.0 - 36.0 g/dL   RDW 13.7 11.5 - 15.5 %   Platelets 351 150 - 400 K/uL  I-Stat CG4 Lactic Acid, ED     Status: Abnormal   Collection Time: 09/16/15 12:45 PM  Result Value Ref Range   Lactic Acid, Venous 2.48 (HH) 0.5 - 2.0 mmol/L   Comment NOTIFIED PHYSICIAN   Urinalysis, Routine w reflex microscopic (not at [facility])     Status: None   Collection Time: 09/16/15  1:50 PM  Result Value Ref Range   Color, Urine YELLOW YELLOW   APPearance CLEAR CLEAR   Specific Gravity, Urine 1.017 1.005 - 1.030   pH 5.0 5.0 - 8.0   Glucose, UA NEGATIVE NEGATIVE mg/dL   Hgb urine dipstick NEGATIVE NEGATIVE   Bilirubin Urine NEGATIVE NEGATIVE   Ketones, ur NEGATIVE NEGATIVE mg/dL   Protein, ur NEGATIVE NEGATIVE  mg/dL   Urobilinogen, UA 0.2 0.0 - 1.0 mg/dL   Nitrite NEGATIVE NEGATIVE   Leukocytes, UA NEGATIVE NEGATIVE    Comment: MICROSCOPIC NOT DONE ON URINES WITH NEGATIVE PROTEIN, BLOOD, LEUKOCYTES, NITRITE, OR GLUCOSE <1000 mg/dL.  I-Stat CG4 Lactic Acid, ED     Status: None   Collection Time: 09/16/15  3:39 PM  Result Value Ref Range   Lactic Acid, Venous 1.19 0.5 - 2.0 mmol/L   Ct Abdomen Pelvis Wo Contrast  09/16/2015   CLINICAL DATA:  Increasing weakness over the past 14 days. Intermittent lower abdominal pain since February, 2016. Constipation. Urinary tract infection. Elevated white blood cell count. Subsequent encounter.  EXAM: CT ABDOMEN AND PELVIS WITHOUT CONTRAST  TECHNIQUE: Multidetector CT imaging of the abdomen and pelvis was performed following the standard protocol without IV contrast.  COMPARISON:  CT abdomen and pelvis 05/06/2015 and 12/23/2014.  FINDINGS: Hiatal hernia is noted. No pleural or pericardial effusion. Heart size is upper normal. Dependent atelectasis is seen lung bases.  A few small stones are seen in the gallbladder. The liver, spleen, adrenal glands and pancreas are unremarkable. There is cortical atrophy and kidneys. Small cyst in the upper pole of the left kidney is again seen, unchanged.  There a new area of increased attenuation in subcutaneous fat along the right cleft of the buttock. Area of involvement measures 4.4 cm transverse by 6.0 cm AP by approximately 6.5 cm craniocaudal. Gas in subcutaneous fat is seen the left buttock possibly from prior injection.  The patient is status post hysterectomy. Patient's sigmoid diverticulosis in an otherwise unremarkable: The patient appears to be status post appendectomy. The small bowel is unremarkable. No lymphadenopathy, free  air or intra-abdominal focal fluid collection is identified. Fat containing umbilical hernia is unchanged.  Bones demonstrate a fracture of L5 where the patient is status post vertebral augmentation.  Methylmethacrylate extends into the right lateral recess and foramen and the L4-5 disc interspace, unchanged. No acute fracture is seen. The patient has convex left lumbar scoliosis and multilevel spondylosis.  IMPRESSION: Infiltration of fat along the right cleft of the buttock is nonspecific but could be due to contusion or phlegmon and cellulitis. This area should be amenable to direct visual inspection.  No acute finding in the abdomen or pelvis.  Gallstones without evidence of cholecystitis.  Hiatal hernia.  Atherosclerosis.  Lumbar spondylosis. Status post L5 vertebral augmentation, unchanged in appearance.   Electronically Signed   By: Inge Rise M.D.   On: 09/16/2015 14:38   Dg Chest Port 1 View  09/16/2015   CLINICAL DATA:  Sepsis.  Weakness.  Initial encounter.  EXAM: PORTABLE CHEST 1 VIEW  COMPARISON:  03/03/2015.  FINDINGS: Cardiopericardial silhouette within normal limits. Mediastinal contours normal. Trachea midline. No airspace disease or effusion. Lung volumes are slightly low. Monitoring leads project over the chest.  IMPRESSION: No active disease.   Electronically Signed   By: Dereck Ligas M.D.   On: 09/16/2015 13:33       Assessment/Plan 1. Sepsis -per medicine 2. Right gluteal abscess -there is no evidence of cellulitic changes over this spontaneous opening, but clearly she has copious amounts of purulent drainage and likely further infection that needs to be unroofed. -remain NPO -Dr. Hulen Skains evaluating currently.  She will likely need to go to the OR tonight for incision and drainage of this area -agree with abx therapy 3. Multiple other medical problems -will defer to medical service  Shakthi Scipio E 09/16/2015, 3:47 PM Pager: [phone]

## 2015-09-17 ENCOUNTER — Encounter (HOSPITAL_COMMUNITY): Payer: Self-pay | Admitting: General Surgery

## 2015-09-17 DIAGNOSIS — G4733 Obstructive sleep apnea (adult) (pediatric): Secondary | ICD-10-CM

## 2015-09-17 DIAGNOSIS — L0231 Cutaneous abscess of buttock: Secondary | ICD-10-CM

## 2015-09-17 DIAGNOSIS — L0291 Cutaneous abscess, unspecified: Secondary | ICD-10-CM

## 2015-09-17 DIAGNOSIS — N179 Acute kidney failure, unspecified: Secondary | ICD-10-CM

## 2015-09-17 DIAGNOSIS — E785 Hyperlipidemia, unspecified: Secondary | ICD-10-CM

## 2015-09-17 DIAGNOSIS — E440 Moderate protein-calorie malnutrition: Secondary | ICD-10-CM | POA: Insufficient documentation

## 2015-09-17 DIAGNOSIS — K5900 Constipation, unspecified: Secondary | ICD-10-CM

## 2015-09-17 DIAGNOSIS — K219 Gastro-esophageal reflux disease without esophagitis: Secondary | ICD-10-CM

## 2015-09-17 LAB — COMPREHENSIVE METABOLIC PANEL
ALBUMIN: 1.4 g/dL — AB (ref 3.5–5.0)
ALK PHOS: 132 U/L — AB (ref 38–126)
ALT: 28 U/L (ref 14–54)
ANION GAP: 7 (ref 5–15)
AST: 32 U/L (ref 15–41)
BILIRUBIN TOTAL: 0.9 mg/dL (ref 0.3–1.2)
BUN: 22 mg/dL — AB (ref 6–20)
CALCIUM: 8.6 mg/dL — AB (ref 8.9–10.3)
CO2: 20 mmol/L — AB (ref 22–32)
Chloride: 107 mmol/L (ref 101–111)
Creatinine, Ser: 1.08 mg/dL — ABNORMAL HIGH (ref 0.44–1.00)
GFR calc Af Amer: 52 mL/min — ABNORMAL LOW (ref >60)
GFR calc non Af Amer: 44 mL/min — ABNORMAL LOW (ref >60)
GLUCOSE: 94 mg/dL (ref 65–99)
Potassium: 4.5 mmol/L (ref 3.5–5.1)
Sodium: 134 mmol/L — ABNORMAL LOW (ref 135–145)
Total Protein: 4.2 g/dL — ABNORMAL LOW (ref 6.5–8.1)

## 2015-09-17 LAB — CBC
HEMATOCRIT: 25.3 % — AB (ref 36.0–46.0)
HEMOGLOBIN: 8.8 g/dL — AB (ref 12.0–15.0)
MCH: 34.1 pg — ABNORMAL HIGH (ref 26.0–34.0)
MCHC: 34.8 g/dL (ref 30.0–36.0)
MCV: 98.1 fL (ref 78.0–100.0)
Platelets: 326 10*3/uL (ref 150–400)
RBC: 2.58 MIL/uL — AB (ref 3.87–5.11)
RDW: 13.8 % (ref 11.5–15.5)
WBC: 28.9 10*3/uL — AB (ref 4.0–10.5)

## 2015-09-17 LAB — URINE CULTURE: Culture: NO GROWTH

## 2015-09-17 LAB — GLUCOSE, CAPILLARY
GLUCOSE-CAPILLARY: 128 mg/dL — AB (ref 65–99)
Glucose-Capillary: 112 mg/dL — ABNORMAL HIGH (ref 65–99)

## 2015-09-17 LAB — LACTIC ACID, PLASMA: Lactic Acid, Venous: 1 mmol/L (ref 0.5–2.0)

## 2015-09-17 LAB — SURGICAL PCR SCREEN
MRSA, PCR: POSITIVE — AB
Staphylococcus aureus: POSITIVE — AB

## 2015-09-17 MED ORDER — ENSURE ENLIVE PO LIQD
237.0000 mL | Freq: Two times a day (BID) | ORAL | Status: DC
  Administered 2015-09-17 (×2): 237 mL via ORAL

## 2015-09-17 MED ORDER — CHLORHEXIDINE GLUCONATE CLOTH 2 % EX PADS
6.0000 | MEDICATED_PAD | Freq: Every day | CUTANEOUS | Status: DC
  Administered 2015-09-18 – 2015-09-21 (×4): 6 via TOPICAL

## 2015-09-17 MED ORDER — SODIUM CHLORIDE 0.9 % IJ SOLN: 10.0000 mL | INTRAMUSCULAR | Status: DC | PRN

## 2015-09-17 MED ORDER — MUPIROCIN 2 % EX OINT
1.0000 "application " | TOPICAL_OINTMENT | Freq: Two times a day (BID) | CUTANEOUS | Status: DC
  Administered 2015-09-17 – 2015-09-21 (×9): 1 via NASAL
  Filled 2015-09-17 (×2): qty 22

## 2015-09-17 MED ORDER — PANTOPRAZOLE SODIUM 40 MG PO TBEC
40.0000 mg | DELAYED_RELEASE_TABLET | Freq: Every day | ORAL | Status: DC
  Administered 2015-09-17 – 2015-09-21 (×4): 40 mg via ORAL
  Filled 2015-09-17 (×4): qty 1

## 2015-09-17 MED ORDER — POLYETHYLENE GLYCOL 3350 17 G PO PACK
17.0000 g | PACK | Freq: Every day | ORAL | Status: DC
  Administered 2015-09-17: 17 g via ORAL
  Filled 2015-09-17 (×2): qty 1

## 2015-09-17 MED ORDER — SODIUM CHLORIDE 0.9 % IJ SOLN
10.0000 mL | Freq: Two times a day (BID) | INTRAMUSCULAR | Status: DC
  Administered 2015-09-17 – 2015-09-19 (×3): 10 mL

## 2015-09-17 NOTE — Progress Notes (Signed)
Patient placed on warming blanket .Will continue to monitor.

## 2015-09-17 NOTE — Progress Notes (Signed)
[facility] Progress Note 1 Day Post-Op  Subjective: Patient complaining of a lot of back pain.  Wound undressed and packing removed.  Has circumferential shelf that also has a penrose through it.  Objective: Vital signs in last 24 hours: Temp:  [92.6 F (33.7 C)-97.6 F (36.4 C)] 97.4 F (36.3 C) (09/30 0751) Pulse Rate:  [63-86] 77 (09/30 0600) Resp:  [8-36] 23 (09/30 0600) BP: (92-152)/(26-72) 122/35 mmHg (09/30 0600) SpO2:  [94 %-100 %] 97 % (09/30 0538) Weight:  [59.421 kg (131 lb)-60.918 kg (134 lb 4.8 oz)] 60.918 kg (134 lb 4.8 oz) (09/29 2130)    Intake/Output from previous day: 09/29 0701 - 09/30 0700 In: 4837.5 [I.V.:3912.5; IV Piggyback:100] Out: 25 [Blood:25] Intake/Output this shift:    General: In costant pain.  No PICC line yet  Lungs: Clear  Abd: Benign  Extremities: No changes  Neuro: Intact.  Lab Results:  (wbc:2,hgb:2,hct:2,plt:2) BMET ) Recent Labs  09/16/15 1232 09/16/15 1535  NA 130* 130*  K 4.5 4.2  CL 99* 105  CO2 22 20*  GLUCOSE 107* 125*  BUN 32* 28*  CREATININE 1.54* 1.22*  CALCIUM 9.9 8.7*   PT/INR No results for input(s): LABPROT, INR in the last 72 hours. ABG No results for input(s): PHART, HCO3 in the last 72 hours.  Invalid input(s): PCO2, PO2  Studies/Results: Ct Abdomen Pelvis Wo Contrast  09/16/2015   CLINICAL DATA:  Increasing weakness over the past 14 days. Intermittent lower abdominal pain since February, 2016. Constipation. Urinary tract infection. Elevated white blood cell count. Subsequent encounter.  EXAM: CT ABDOMEN AND PELVIS WITHOUT CONTRAST  TECHNIQUE: Multidetector CT imaging of the abdomen and pelvis was performed following the standard protocol without IV contrast.  COMPARISON:  CT abdomen and pelvis 05/06/2015 and 12/23/2014.  FINDINGS: Hiatal hernia is noted. No pleural or pericardial effusion. Heart size is upper normal. Dependent atelectasis is seen lung bases.  A few small stones are seen in the  gallbladder. The liver, spleen, adrenal glands and pancreas are unremarkable. There is cortical atrophy and kidneys. Small cyst in the upper pole of the left kidney is again seen, unchanged.  There a new area of increased attenuation in subcutaneous fat along the right cleft of the buttock. Area of involvement measures 4.4 cm transverse by 6.0 cm AP by approximately 6.5 cm craniocaudal. Gas in subcutaneous fat is seen the left buttock possibly from prior injection.  The patient is status post hysterectomy. Patient's sigmoid diverticulosis in an otherwise unremarkable: The patient appears to be status post appendectomy. The small bowel is unremarkable. No lymphadenopathy, free air or intra-abdominal focal fluid collection is identified. Fat containing umbilical hernia is unchanged.  Bones demonstrate a fracture of L5 where the patient is status post vertebral augmentation. Methylmethacrylate extends into the right lateral recess and foramen and the L4-5 disc interspace, unchanged. No acute fracture is seen. The patient has convex left lumbar scoliosis and multilevel spondylosis.  IMPRESSION: Infiltration of fat along the right cleft of the buttock is nonspecific but could be due to contusion or phlegmon and cellulitis. This area should be amenable to direct visual inspection.  No acute finding in the abdomen or pelvis.  Gallstones without evidence of cholecystitis.  Hiatal hernia.  Atherosclerosis.  Lumbar spondylosis. Status post L5 vertebral augmentation, unchanged in appearance.   Electronically Signed   By: Drusilla Kanner M.D.   On: 09/16/2015 14:38   Dg Chest Port 1 View  09/16/2015   CLINICAL DATA:  Sepsis.  Weakness.  Initial encounter.  EXAM: PORTABLE CHEST 1 VIEW  COMPARISON:  03/03/2015.  FINDINGS: Cardiopericardial silhouette within normal limits. Mediastinal contours normal. Trachea midline. No airspace disease or effusion. Lung volumes are slightly low. Monitoring leads project over the chest.   IMPRESSION: No active disease.   Electronically Signed   By: Andreas Newport M.D.   On: 09/16/2015 13:33    Anti-infectives: Anti-infectives    Start     Dose/Rate Route Frequency Ordered Stop   09/17/15 1400  vancomycin (VANCOCIN) IVPB 1000 mg/200 mL premix     1,000 mg 200 mL/hr over 60 Minutes Intravenous Every 24 hours 09/16/15 1325     09/16/15 2200  piperacillin-tazobactam (ZOSYN) IVPB 3.375 g     3.375 g 12.5 mL/hr over 240 Minutes Intravenous 3 times per day 09/16/15 1325     09/16/15 1300  piperacillin-tazobactam (ZOSYN) IVPB 3.375 g     3.375 g 100 mL/hr over 30 Minutes Intravenous  Once 09/16/15 1252 09/16/15 1359   09/16/15 1300  vancomycin (VANCOCIN) IVPB 1000 mg/200 mL premix     1,000 mg 200 mL/hr over 60 Minutes Intravenous  Once 09/16/15 1252 09/16/15 1430      Assessment/Plan: s/p Procedure(s): IRRIGATION AND DEBRIDEMENT AND DRAINAGE OR RIGHT BUTTOCK WOUND  Patient to get PICC line per family in IR today.  Continue dressing changes daily now. IV antibiotics  LOS: 1 day   Marta Lamas. Gae Bon, MD, FACS [phone] [facility] 09/17/2015

## 2015-09-17 NOTE — Progress Notes (Signed)
PT Cancellation Note  Patient Details Name: Jillian Wheeler MRN: [id number] DOB: 04/06/1925   Cancelled Treatment:    Reason Eval/Treat Not Completed: Patient declined, no reason specified (pt refusing any mobility stating tired and left ankle pain. Pt lethargic requiring constant stimuli to maintain eyes open. Pt unable to rationalize that if she doesn't move acutely she can't get in [facility] to ride home. Will attempt at later date)   Delorse Lek 09/17/2015, 12:57 PM Delaney Meigs, PT [phone]

## 2015-09-17 NOTE — Progress Notes (Signed)
SLP Cancellation Note  Patient Details Name: Jillian Wheeler MRN: [id number] DOB: 06-02-1925   Cancelled treatment:       Reason Eval/Treat Not Completed: Patient declined, wanted to rest. RN reports no signs of aspiration with thin liquids. Will return for further assessment.   Harlon Ditty, MA CCC-SLP [phone]  Claudine Mouton 09/17/2015, 10:10 AM

## 2015-09-17 NOTE — Progress Notes (Addendum)
Initial Nutrition Assessment  DOCUMENTATION CODES:   Non-severe (moderate) malnutrition in context of chronic illness  INTERVENTION:   Ensure Enlive po BID, each supplement provides 350 kcal and 20 grams of protein  NUTRITION DIAGNOSIS:   Inadequate oral intake related to poor appetite as evidenced by meal completion < 25%.  GOAL:   Patient will meet greater than or equal to 90% of their needs  MONITOR:   PO intake, Supplement acceptance, Diet advancement, Labs, Weight trends, Skin, I & O's  REASON FOR ASSESSMENT:   Malnutrition Screening Tool    ASSESSMENT:   This is a 81 year old female patient resident of assisted living facility with a history of recurrent hyponatremia, obstructive sleep apnea, chronic diastolic heart failure, dementia, significant hearing loss dyslipidemia, GERD, anemia, asthma and recurrent UTIs. Patient had recently been treated for the past 2 days with oral antibiotics for presumed urinary tract infection. At that time she had been evaluated by her primary care physician on 9/27 and was found to have a white cell count of 26,000 and was given intramuscular Cipro. Recheck today at the nursing facility WBC was 25,500. Patient continued to complain of lower abdominal pain which is also a chronic issue since February 2016. She has experienced chronic constipation despite being given enemas and other therapies. Patient also takes tramadol and hydrocodone for chronic pain.  Pt admitted with sepsis secondary to gluteal abscess.   Spoke with pt at bedside, who was drowsy but able to answer questions appropriately. She endorses poor appetite and weight loss, however, she is unable to provide further details. She reveals that she was receiving physical therapy PTA, but recently stopped due to weakness.   Reviewed wt hx, which revealed a 12.4% wt loss over the past 6 months.   Spoke with RN, who reports pt drank only orange juice on breakfast tray this morning. Pt  refused BSE this AM.   Nutrition-Focused physical exam completed. Findings are mild to moderate fat depletion, mild to moderate muscle depletion, and mild edema.   Labs reviewed: Na: 134 (on IV supplementation).  Diet Order:  DIET DYS 3 Room service appropriate?: Yes; Fluid consistency:: Thin  Skin:  Wound (see comment) (gluteal abcess)  Last BM:  PTA  Height:   Ht Readings from Last 1 Encounters:  09/16/15  (1.626 m)    Weight:   Wt Readings from Last 1 Encounters:  09/16/15 134 lb 4.8 oz (60.918 kg)    Ideal Body Weight:  54.5 kg  BMI:  Body mass index is 23.04 kg/(m^2).  Estimated Nutritional Needs:   Kcal:  1500-1700  Protein:  70-85 grams  Fluid:  1.5-1.7 L  EDUCATION NEEDS:   Education needs no appropriate at this time  Jenifer A. Mayford Knife, RD, LDN, CDE Pager: [phone] After hours Pager: [phone]

## 2015-09-17 NOTE — Progress Notes (Signed)
Spoke with Pts daughter-- Pts daughter does not want pt to refuse PT anymore, she stated to give her pain medication before PT and call her if pt refuses.

## 2015-09-17 NOTE — Progress Notes (Signed)
I spoke with the pt's daughter, [name], who initially wanted PICC line to be placed by IR (due to "high infection rate at [facility]" when IV team places.)  I explained her mother does not have any IV access and need something soon bc IV anitbiotics are due.  She agreed to let IV team place PICC to avoid delay in antibiotics and will sign the consent.

## 2015-09-17 NOTE — Progress Notes (Signed)
PATIENT DETAILS Name: Jillian Wheeler Age: 81 y.o. Sex: female Date of Birth: 18-Sep-1925 Admit Date: 09/16/2015 Admitting Physician Richarda Overlie, MD NFA:[email or web address],QMVHQIONGE R, MD  Brief narrative: 81 year old female resident of ALF with OSA, chronic diastolic heart failure, recurrent hyponatremia presented with sepsis secondary to a right gluteal abscess. Underwent incision and drainage on 9/29. Cultures pending, sepsis pathophysiology slowly improving.  Subjective: Complains of pain at the I&D site. Was sleeping comfortably when I walked in-upset that I woke her up.  Assessment/Plan: Principal Problem: Sepsis secondary to right gluteal abscess: Sepsis pathophysiology improving-no hypothermia this morning, leukocytosis downtrending. Continue vancomycin/Zosyn, intraoperative wound cultures and blood cultures pending at this time. Continue to monitor in stepdown unit for another 24 hours. General surgery following  Active Problems: Acute renal failure: Likely secondary to prenatal azotemia due to above. Creatinine significantly improved with IV fluids. Follow  Hyponatremia: Likely hypovolemic hyponatremia secondary to sepsis. Resolving with hydration.  Anemia: Has chronic normocytic anemia at baseline, hemoglobin likely decreased secondary to acute illness and IV fluids. Follow.  Chronic diastolic heart failure: Any decompensated, decrease rate of IV fluids to 75 mL an hour. Follow and monitor closely  Essential hypertension: Blood pressure soft-secondary to sepsis. Continue to hold amlodipine.  Hyperlipidemia: Resume statin in the next few days when more clinically improved  Hypothyroidism: Continue levothyroxine-changed to oral route when more stable.  OSA (obstructive sleep apnea): Continue CPAP daily at bedtime  Constipation: Start MiraLAX.  GERD: Continue PPI  Disposition: Remain inpatient-suspect will require SNF-expect discharge early next  week.  Antimicrobial agents  See below  Anti-infectives    Start     Dose/Rate Route Frequency Ordered Stop   09/17/15 1400  vancomycin (VANCOCIN) IVPB 1000 mg/200 mL premix     1,000 mg 200 mL/hr over 60 Minutes Intravenous Every 24 hours 09/16/15 1325     09/16/15 2200  piperacillin-tazobactam (ZOSYN) IVPB 3.375 g     3.375 g 12.5 mL/hr over 240 Minutes Intravenous 3 times per day 09/16/15 1325     09/16/15 1300  piperacillin-tazobactam (ZOSYN) IVPB 3.375 g     3.375 g 100 mL/hr over 30 Minutes Intravenous  Once 09/16/15 1252 09/16/15 1359   09/16/15 1300  vancomycin (VANCOCIN) IVPB 1000 mg/200 mL premix     1,000 mg 200 mL/hr over 60 Minutes Intravenous  Once 09/16/15 1252 09/16/15 1430      DVT Prophylaxis: Prophylactic Heparin  Code Status:  DNR  Family Communication Left message for daughter ([phone])  Procedures: I&D 9/29  CONSULTS:  general surgery  Time spent 40 minutes-Greater than 50% of this time was spent in counseling, explanation of diagnosis, planning of further management, and coordination of care.  MEDICATIONS: Scheduled Meds: . antiseptic oral rinse  7 mL Mouth Rinse BID  . Chlorhexidine Gluconate Cloth  6 each Topical Q0600  . clonazePAM  1 mg Oral QHS  . heparin  5,000 Units Subcutaneous 3 times per day  . levothyroxine  44 mcg Intravenous QAC breakfast  . mupirocin ointment  1 application Nasal BID  . piperacillin-tazobactam (ZOSYN)  IV  3.375 g Intravenous 3 times per day  . sodium chloride  3 mL Intravenous Q12H  . vancomycin  1,000 mg Intravenous Q24H   Continuous Infusions: . sodium chloride 75 mL/hr at 09/17/15 0900   PRN Meds:.acetaminophen **OR** acetaminophen, albuterol, fluticasone, iohexol, morphine injection    PHYSICAL EXAM: Vital signs in last 24 hours:  Filed Vitals:   09/17/15 0538 09/17/15 0600 09/17/15 0751 09/17/15 0900  BP:  122/35  112/37  Pulse: 78 77  71  Temp:  97.4 F (36.3 C) 97.4 F (36.3 C)    TempSrc:  Rectal Oral   Resp: 22 23  36  Height:      Weight:      SpO2: 97%   96%    Weight change:  Filed Weights   09/16/15 1305 09/16/15 2130  Weight: 59.421 kg (131 lb) 60.918 kg (134 lb 4.8 oz)   Body mass index is 23.04 kg/(m^2).   Gen Exam: Awake and alert  Neck: Supple, No JVD.   Chest: B/L Clear.   CVS: S1 S2 Regular, no murmurs.  Abdomen: soft, BS +, non tender, non distended.  Extremities: no edema, lower extremities warm to touch Neurologic: Non Focal.   Skin: No Rash.  Wounds: Right gluteal wound-dressing soaked-drain in place  Intake/Output from previous day:  Intake/Output Summary (Last 24 hours) at 09/17/15 0924 Last data filed at 09/17/15 0900  Gross per 24 hour  Intake 5182.5 ml  Output    125 ml  Net 5057.5 ml     LAB RESULTS: CBC  Recent Labs Lab 09/16/15 1232 09/17/15 0756  WBC 30.5* 28.9*  HGB 9.3* 8.8*  HCT 27.5* 25.3*  PLT 351 326  MCV 99.3 98.1  MCH 33.6 34.1*  MCHC 33.8 34.8  RDW 13.7 13.8    Chemistries   Recent Labs Lab 09/16/15 1232 09/16/15 1535  NA 130* 130*  K 4.5 4.2  CL 99* 105  CO2 22 20*  GLUCOSE 107* 125*  BUN 32* 28*  CREATININE 1.54* 1.22*  CALCIUM 9.9 8.7*    CBG:  Recent Labs Lab 09/16/15 1720 09/16/15 2138 09/16/15 2139  GLUCAP 97 112* 128*    GFR Estimated Creatinine Clearance: 27.5 mL/min (by C-G formula based on Cr of 1.22).  Coagulation profile No results for input(s): INR, PROTIME in the last 168 hours.  Cardiac Enzymes No results for input(s): CKMB, TROPONINI, MYOGLOBIN in the last 168 hours.  Invalid input(s): CK  Invalid input(s): POCBNP No results for input(s): DDIMER in the last 72 hours. No results for input(s): HGBA1C in the last 72 hours. No results for input(s): CHOL, HDL, LDLCALC, TRIG, CHOLHDL, LDLDIRECT in the last 72 hours. No results for input(s): TSH, T4TOTAL, T3FREE, THYROIDAB in the last 72 hours.  Invalid input(s): FREET3 No results for input(s):  VITAMINB12, FOLATE, FERRITIN, TIBC, IRON, RETICCTPCT in the last 72 hours.  Recent Labs  09/16/15 1232  LIPASE 13*    Urine Studies No results for input(s): UHGB, CRYS in the last 72 hours.  Invalid input(s): UACOL, UAPR, USPG, UPH, UTP, UGL, UKET, UBIL, UNIT, UROB, ULEU, UEPI, UWBC, URBC, UBAC, CAST, UCOM, BILUA  MICROBIOLOGY: Recent Results (from the past 240 hour(s))  Anaerobic culture     Status: None (Preliminary result)   Collection Time: 09/16/15  6:28 PM  Result Value Ref Range Status   Specimen Description ABSCESS RIGHT BUTTOCKS  Final   Special Requests PATIENT ON FOLLOWING VANC  Final   Gram Stain   Final    ABUNDANT WBC PRESENT,BOTH PMN AND MONONUCLEAR NO SQUAMOUS EPITHELIAL CELLS SEEN RARE GRAM POSITIVE COCCI IN PAIRS Performed at [facility]    Culture PENDING  Incomplete   Report Status PENDING  Incomplete  Culture, routine-abscess     Status: None (Preliminary result)   Collection Time: 09/16/15  6:28 PM  Result Value  Ref Range Status   Specimen Description ABSCESS RIGHT BUTTOCKS  Final   Special Requests PATIENT ON FOLLOWING VANC  Final   Gram Stain   Final    ABUNDANT WBC PRESENT,BOTH PMN AND MONONUCLEAR NO SQUAMOUS EPITHELIAL CELLS SEEN RARE GRAM POSITIVE COCCI IN PAIRS Performed at [facility]    Culture PENDING  Incomplete   Report Status PENDING  Incomplete  Surgical pcr screen     Status: Abnormal   Collection Time: 09/16/15 10:39 PM  Result Value Ref Range Status   MRSA, PCR POSITIVE (A) NEGATIVE Final   Staphylococcus aureus POSITIVE (A) NEGATIVE Final    Comment:        The Xpert SA Assay (FDA approved for NASAL specimens in patients over 5 years of age), is one component of a comprehensive surveillance program.  Test performance has been validated by [facility] for patients greater than or equal to 12 year old. It is not intended to diagnose infection nor to guide or monitor treatment.     RADIOLOGY  STUDIES/RESULTS: Ct Abdomen Pelvis Wo Contrast  09/16/2015   CLINICAL DATA:  Increasing weakness over the past 14 days. Intermittent lower abdominal pain since February, 2016. Constipation. Urinary tract infection. Elevated white blood cell count. Subsequent encounter.  EXAM: CT ABDOMEN AND PELVIS WITHOUT CONTRAST  TECHNIQUE: Multidetector CT imaging of the abdomen and pelvis was performed following the standard protocol without IV contrast.  COMPARISON:  CT abdomen and pelvis 05/06/2015 and 12/23/2014.  FINDINGS: Hiatal hernia is noted. No pleural or pericardial effusion. Heart size is upper normal. Dependent atelectasis is seen lung bases.  A few small stones are seen in the gallbladder. The liver, spleen, adrenal glands and pancreas are unremarkable. There is cortical atrophy and kidneys. Small cyst in the upper pole of the left kidney is again seen, unchanged.  There a new area of increased attenuation in subcutaneous fat along the right cleft of the buttock. Area of involvement measures 4.4 cm transverse by 6.0 cm AP by approximately 6.5 cm craniocaudal. Gas in subcutaneous fat is seen the left buttock possibly from prior injection.  The patient is status post hysterectomy. Patient's sigmoid diverticulosis in an otherwise unremarkable: The patient appears to be status post appendectomy. The small bowel is unremarkable. No lymphadenopathy, free air or intra-abdominal focal fluid collection is identified. Fat containing umbilical hernia is unchanged.  Bones demonstrate a fracture of L5 where the patient is status post vertebral augmentation. Methylmethacrylate extends into the right lateral recess and foramen and the L4-5 disc interspace, unchanged. No acute fracture is seen. The patient has convex left lumbar scoliosis and multilevel spondylosis.  IMPRESSION: Infiltration of fat along the right cleft of the buttock is nonspecific but could be due to contusion or phlegmon and cellulitis. This area should be  amenable to direct visual inspection.  No acute finding in the abdomen or pelvis.  Gallstones without evidence of cholecystitis.  Hiatal hernia.  Atherosclerosis.  Lumbar spondylosis. Status post L5 vertebral augmentation, unchanged in appearance.   Electronically Signed   By: Drusilla Kanner M.D.   On: 09/16/2015 14:38   Dg Chest Port 1 View  09/16/2015   CLINICAL DATA:  Sepsis.  Weakness.  Initial encounter.  EXAM: PORTABLE CHEST 1 VIEW  COMPARISON:  03/03/2015.  FINDINGS: Cardiopericardial silhouette within normal limits. Mediastinal contours normal. Trachea midline. No airspace disease or effusion. Lung volumes are slightly low. Monitoring leads project over the chest.  IMPRESSION: No active disease.   Electronically Signed  By: Andreas Newport M.D.   On: 09/16/2015 13:33    Jeoffrey Massed, MD  [facility] Pager:[phone]  If 7PM-7AM, please contact night-coverage [email or web address] Password [email or web address] 09/17/2015, 9:24 AM   LOS: 1 day

## 2015-09-17 NOTE — Progress Notes (Signed)
Patient refuses blood draw this morning.Text paged Maren Reamer NP.

## 2015-09-17 NOTE — Progress Notes (Addendum)
Patient arrived to room 2 h17 via stretcher from PACU.Marland KitchenAssisted patient to bed by nursing staff.Patient alert to self and situation ,Attempted to reorient patient to surroundings.Oriented patient to call bell.Dressing to buttocks soiled with urine patient refusing insertion of foley catheter at this time wants to speak with her daughter first.Patient cleaned and dry dressing placed.Unable to obtain oral temperature not reading ;so rectal temperature 92.6 obtained. Warm blankets placed on patient.Text paged Maren Reamer NP this result. Will continue to monitor patient.

## 2015-09-17 NOTE — Progress Notes (Signed)
Patient pulling off oxygen sensor,pulling at telemetry leads,and IV.Patient alert to self only attempted to reorient unsuccessful.Mittens placed on patient to keep lines in.Ramona charge nurse notified.

## 2015-09-17 NOTE — Progress Notes (Signed)
Peripherally Inserted Central Catheter/Midline Placement  The IV Nurse has discussed with the patient and/or persons authorized to consent for the patient, the purpose of this procedure and the potential benefits and risks involved with this procedure.  The benefits include less needle sticks, lab draws from the catheter and patient may be discharged home with the catheter.  Risks include, but not limited to, infection, bleeding, blood clot (thrombus formation), and puncture of an artery; nerve damage and irregular heat beat.  Alternatives to this procedure were also discussed.  PICC/Midline Placement Documentation  PICC / Midline Single Lumen 09/17/15 PICC Right Other (Comment) 40 cm 1 cm (Active)     Telephone consent  Jillian Wheeler 09/17/2015, 2:50 PM

## 2015-09-17 NOTE — Care Management Note (Signed)
Case Management Note  Patient Details  Name: Jillian Wheeler MRN: [id number] Date of Birth: Jan 19, 1925  Subjective/Objective:      Adm w sepsis              Action/Plan: from assited living facility, sw ref, pcp dr Felipa Eth   Expected Discharge Date:                  Expected Discharge Plan:  Assisted Living / Rest Home  In-House Referral:  Clinical Social Work  Discharge planning Services     Post Acute Care Choice:    Choice offered to:     DME Arranged:    DME Agency:     HH Arranged:    HH Agency:     Status of Service:     Medicare Important Message Given:    Date Medicare IM Given:    Medicare IM give by:    Date Additional Medicare IM Given:    Additional Medicare Important Message give by:     If discussed at Long Length of Stay Meetings, dates discussed:    Additional Comments: ur review done  Hanley Hays, RN 09/17/2015, 7:58 AM

## 2015-09-18 LAB — CBC
HEMATOCRIT: 25.3 % — AB (ref 36.0–46.0)
Hemoglobin: 9 g/dL — ABNORMAL LOW (ref 12.0–15.0)
MCH: 34.2 pg — AB (ref 26.0–34.0)
MCHC: 35.6 g/dL (ref 30.0–36.0)
MCV: 96.2 fL (ref 78.0–100.0)
PLATELETS: 323 10*3/uL (ref 150–400)
RBC: 2.63 MIL/uL — ABNORMAL LOW (ref 3.87–5.11)
RDW: 13.8 % (ref 11.5–15.5)
WBC: 23.5 10*3/uL — ABNORMAL HIGH (ref 4.0–10.5)

## 2015-09-18 LAB — LACTIC ACID, PLASMA: Lactic Acid, Venous: 1.3 mmol/L (ref 0.5–2.0)

## 2015-09-18 LAB — T4, FREE: Free T4: 1.06 ng/dL (ref 0.61–1.12)

## 2015-09-18 LAB — TSH: TSH: 2.604 u[IU]/mL (ref 0.350–4.500)

## 2015-09-18 LAB — PROCALCITONIN: Procalcitonin: 0.33 ng/mL

## 2015-09-18 MED ORDER — ALLOPURINOL 100 MG PO TABS
100.0000 mg | ORAL_TABLET | Freq: Every day | ORAL | Status: DC
  Administered 2015-09-19 – 2015-09-21 (×3): 100 mg via ORAL
  Filled 2015-09-18 (×4): qty 1

## 2015-09-18 MED ORDER — HYDROCODONE-ACETAMINOPHEN 5-325 MG PO TABS
1.0000 | ORAL_TABLET | Freq: Four times a day (QID) | ORAL | Status: DC | PRN
  Administered 2015-09-18 – 2015-09-20 (×7): 1 via ORAL
  Filled 2015-09-18 (×7): qty 1

## 2015-09-18 MED ORDER — AMLODIPINE BESYLATE 5 MG PO TABS
5.0000 mg | ORAL_TABLET | Freq: Every day | ORAL | Status: DC
  Administered 2015-09-19 – 2015-09-21 (×3): 5 mg via ORAL
  Filled 2015-09-18 (×4): qty 1

## 2015-09-18 NOTE — Progress Notes (Signed)
Report called to Quintella Baton, RN on 2H. Daughter wants patient to finish eating before transferring.

## 2015-09-18 NOTE — Progress Notes (Signed)
Lab. made aware that pt. pulled out her picc line last night and lab . has to  do blood works.

## 2015-09-18 NOTE — Progress Notes (Signed)
Complained of pain on her back scale 5, repositioned to side with pillow. Tylenol 2 tabs po given. Pt. Became anxious, talked to her, when given the am meds. wants  to see labels.and called undersigned "bitch".MD made aware of the behavior.

## 2015-09-18 NOTE — Progress Notes (Signed)
Transferred to 6east room 24 by bed, stable ,report given to RN, belongings with pt. Call light within reach.

## 2015-09-18 NOTE — Progress Notes (Signed)
Transferred back from 6east by bed. Daughter and son at bedside.

## 2015-09-18 NOTE — Progress Notes (Signed)
Patient transferred to [id number].  Telemetry placed per MD order. CMT notified.

## 2015-09-18 NOTE — Evaluation (Signed)
Clinical/Bedside Swallow Evaluation Patient Details  Name: ELBA SCHABER MRN: [id number] Date of Birth: 10/14/1925  Today's Date: 09/18/2015 Time: SLP Start Time (ACUTE ONLY): 1150 SLP Stop Time (ACUTE ONLY): 1205 SLP Time Calculation (min) (ACUTE ONLY): 15 min  Past Medical History:  Past Medical History  Diagnosis Date  . Palpitations   . Unspecified fall   . Altered mental status   . Other nonspecific finding on examination of urine   . Symptomatic menopausal or female climacteric states   . Insomnia, unspecified   . Contact dermatitis and other eczema, due to unspecified cause   . Type II or unspecified type diabetes mellitus with neurological manifestations, not stated as uncontrolled   . Dermatophytosis of nail   . Mastodynia   . Contact dermatitis and other eczema due to solvents   . Chronic kidney disease, stage III (moderate)   . Acute bronchitis   . Wheezing   . Pain in joint, pelvic region and thigh   . Other speech disturbance(784.59)   . Unspecified acute reaction to stress   . Other malaise and fatigue   . Spasm of muscle   . Blood in stool   . Other specified noninflammatory disorder of vagina   . Osteoarthrosis, unspecified whether generalized or localized, unspecified site   . Gout, unspecified   . Adjustment disorder with mixed anxiety and depressed mood   . Other vitamin B12 deficiency anemia   . Obstructive sleep apnea (adult) (pediatric)   . Other dyspnea and respiratory abnormality   . Type II or unspecified type diabetes mellitus without mention of complication, not stated as uncontrolled   . Unspecified asthma(493.90)   . Esophageal reflux   . Other and unspecified hyperlipidemia   . Thyrotoxicosis without mention of goiter or other cause, without mention of thyrotoxic crisis or storm   . Unspecified essential hypertension   . 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 SIADH (syndrome of inappropriate ADH production) 05/09/2015   Past Surgical History:  Past  Surgical History  Procedure Laterality Date  . Total knee arthroplasty Right   . Abdominal hysterectomy      still has ovaries  . Appendectomy    . Cervical fusion    . Lumbar spine surgery    . Incision and drainage perirectal abscess N/A 09/16/2015    Procedure: IRRIGATION AND DEBRIDEMENT AND DRAINAGE OR RIGHT BUTTOCK WOUND ;  Surgeon: Gaynelle Adu, MD;  Location: [facility] OR;  Service: General;  Laterality: N/A;   HPI:  This is a 81 year old female patient resident of assisted living facility with a history of recurrent hyponatremia, obstructive sleep apnea, chronic diastolic heart failure, dementia, significant hearing loss dyslipidemia, GERD, anemia, asthma and recurrent UTIs. Patient admitted with abdominal pain, found to have gluteal abcess, sepsis protocol initiated. Underwent debridement on 9/29. CXR has been clear.    Assessment / Plan / Recommendation Clinical Impression  Pt showed no overt s/s of aspiration of thin liquids at bedside; swallow appeared timely and with adequate hyolaryngeal excursion. Pt refused solid consistencies. No acute findings on CXR. Recommend continuing current diet of dysphagia 3/ thin liquids, full supervision to assist with self-feeding. Will sign off at this time; please re-consult if needs arise.    Aspiration Risk  Mild    Diet Recommendation Dysphagia 3 (Mech soft);Thin   Medication Administration: Whole meds with liquid Compensations: Slow rate;Small sips/bites    Other  Recommendations Oral Care Recommendations: Oral care BID   Follow Up Recommendations  Frequency and Duration        Pertinent Vitals/Pain Back pain; RN informed    SLP Swallow Goals     Swallow Study Prior Functional Status       General Other Pertinent Information: This is a 81 year old female patient resident of assisted living facility with a history of recurrent hyponatremia, obstructive sleep apnea, chronic diastolic heart failure, dementia, significant  hearing loss dyslipidemia, GERD, anemia, asthma and recurrent UTIs. Patient admitted with abdominal pain, found to have gluteal abcess, sepsis protocol initiated. Underwent debridement on 9/29. CXR has been clear.  Type of Study: Bedside swallow evaluation Diet Prior to this Study: Dysphagia 3 (soft);Thin liquids Temperature Spikes Noted:  (low temps) Respiratory Status: Room air History of Recent Intubation: No Behavior/Cognition: Alert;Cooperative;Requires cueing Oral Cavity - Dentition: Adequate natural dentition/normal for age Self-Feeding Abilities: Able to feed self Patient Positioning: Upright in bed Baseline Vocal Quality: Normal Volitional Cough: Cognitively unable to elicit Volitional Swallow: Unable to elicit    Oral/Motor/Sensory Function Overall Oral Motor/Sensory Function: Other (comment) (difficult to assess but no issues apparent w/ PO intake)   Ice Chips Ice chips: Not tested   Thin Liquid Thin Liquid: Within functional limits Presentation: Straw    Nectar Thick Nectar Thick Liquid: Not tested   Honey Thick Honey Thick Liquid: Not tested   Puree Puree: Not tested (refused)   Solid   GO    Solid: Not tested       Metro Kung, MA, CCC-SLP 09/18/2015,12:11 PM [phone]

## 2015-09-18 NOTE — Progress Notes (Signed)
Subjective: Patient is restrained this AM. Pulled out her PICC line earlier. No acute distress  Objective: Vital signs in last 24 hours: Temp:Afebrile Pulse Rate: [61 Resp: 23 BP: (128-222)/(37-112) 178/55 mmHg (10/01 0500) SpO2:97%   General: No actue distress  Lungs: Clear  Abd: Benign  Extremities: No changes. Right ischial wound is open and not draining any pus. Packing removed. Will need wet to dry dressing daily. Will start hydrotherapy.  Neuro: Mild delirium.   Assessment/Plan:  Can be transferred from surgical standpoint.  Will start hydrotherapy.  LOS: 4 days  Marta Lamas. Gae Bon, MD, FACS [phone] [facility] 09/18/2015

## 2015-09-18 NOTE — Progress Notes (Signed)
Rectal temp 94.1.  MD notified.  Will transfer patient back to stepdown per MD order.  Spoke with patient's daughter.  She is very upset because no one informed her that her mother was being moved to another unit.  She told me to call house coverage and inform them that she wants to see them when she gets here.  Ron, [facility] notified.

## 2015-09-18 NOTE — Evaluation (Signed)
Physical Therapy Evaluation Patient Details Name: Jillian Wheeler MRN: [id number] DOB: 04-Aug-1925 Today's Date: 09/18/2015   History of Present Illness  81 year old female patient resident of assisted living facility with a history of recurrent hyponatremia, obstructive sleep apnea, chronic diastolic heart failure, dementia, significant hearing loss dyslipidemia, GERD, anemia, asthma and recurrent UTIs.. Pt admitted with sepsis from right gluteal abscess s/p I&D 9/29  Clinical Impression  Pt confused and intermittently agitated during session. She performs best with redirection and distraction to task. Pt with generalized weakness, impaired balance, decreased initiation and safety kyphotic posture with need for bil feet blocked with standing due to sliding and 2 person assist for safety. Pt will benefit from acute therapy to maximize mobility, gait and function to decrease burden of care. Recommend SNF for increased level of assist and supervision but should family decide to return to ALF, assist for all mobility and 24hr supervision recommended with HHPT. Will follow .     Follow Up Recommendations SNF;Supervision/Assistance - 24 hour    Equipment Recommendations  None recommended by PT    Recommendations for Other Services       Precautions / Restrictions Precautions Precautions: Fall Precaution Comments: moderate dementia Restrictions Weight Bearing Restrictions: No      Mobility  Bed Mobility Overal bed mobility: Needs Assistance Bed Mobility: Supine to Sit     Supine to sit: Min assist     General bed mobility comments: verbal and visual cues for sequence with assist to elevate trunk from surface and scoot to EOB  Transfers Overall transfer level: Needs assistance   Transfers: Sit to/from Stand;Stand Pivot Transfers Sit to Stand: +2 physical assistance;+2 safety/equipment Stand pivot transfers: +2 physical assistance;+2 safety/equipment       General transfer  comment: pt with bil HHA to stand from bed and BSC with assist to guide pelvis and control posture for pivot to [facility] then to chair. Pt sitting prematurely with need for 2 person assist for assist and safety  Ambulation/Gait Ambulation/Gait assistance:  (pt unable at this time and refusing)              Stairs            Wheelchair Mobility    Modified Rankin (Stroke Patients Only)       Balance Overall balance assessment: Needs assistance   Sitting balance-Leahy Scale: Fair       Standing balance-Leahy Scale: Poor                               Pertinent Vitals/Pain Pain Location: PAINAD= 2, easily distracted or consoled, reports pain right hip, premedicated with tylenol, refused other meds Pain Intervention(s): Repositioned;Premedicated before session  HR 65 sats 98% on RA    Home Living Family/patient expects to be discharged to:: Assisted living               Home Equipment: Walker - 2 wheels;Wheelchair - manual      Prior Function Level of Independence: Needs assistance   Gait / Transfers Assistance Needed: Pt ambulated in her room with RW, but used w/c all other times.  h/o falls   ADL's / Homemaking Assistance Needed: assistance in/out of shower and to wash back  Comments: PLOF taken from prior admission as pt unable to provide     Hand Dominance        Extremity/Trunk Assessment   Upper Extremity Assessment: Generalized weakness  Lower Extremity Assessment: Generalized weakness      Cervical / Trunk Assessment: Kyphotic  Communication   Communication: HOH  Cognition Arousal/Alertness: Awake/alert Behavior During Therapy: WFL for tasks assessed/performed Overall Cognitive Status: No family/caregiver present to determine baseline cognitive functioning (pt disoriented to place, time, situation, agitation at times able to resolve with distraction and using profanity throughout)       Memory: Decreased  short-term memory              General Comments      Exercises        Assessment/Plan    PT Assessment Patient needs continued PT services  PT Diagnosis Difficulty walking;Generalized weakness;Altered mental status   PT Problem List Decreased strength;Decreased cognition;Decreased activity tolerance;Decreased balance;Decreased skin integrity;Pain;Decreased safety awareness;Decreased knowledge of use of DME;Decreased mobility  PT Treatment Interventions Gait training;Functional mobility training;Therapeutic activities;Balance training;Patient/family education;Cognitive remediation   PT Goals (Current goals can be found in the Care Plan section) Acute Rehab PT Goals Patient Stated Goal: "go back to [place]" PT Goal Formulation: With patient Time For Goal Achievement: 10/02/15 Potential to Achieve Goals: Fair    Frequency Min 2X/week   Barriers to discharge Decreased caregiver support      Co-evaluation               End of Session   Activity Tolerance: Patient tolerated treatment well Patient left: in chair;with call bell/phone within reach;with chair alarm set Nurse Communication: Mobility status;Precautions         Time: 0981-1914 PT Time Calculation (min) (ACUTE ONLY): 22 min   Charges:   PT Evaluation $Initial PT Evaluation Tier I: 1 Procedure     PT G CodesDelorse Lek 09/18/2015, 10:00 AM Delaney Meigs, PT [phone]

## 2015-09-18 NOTE — Progress Notes (Signed)
[facility] Progress Note   Jillian Wheeler  ZOX:[id number]  DOB: 09/17/1925  DOA: 09/16/2015 PCP: Hoyle Sauer, MD  Brief narrative: Jillian Wheeler is a 81 y.o. female from assisted living facility with obstructive sleep apnea, dementia, chronic diastolic heart failure, recurrent hyponatremia who was admitted with a right gluteal abscess. She was noted to have leukocytosis as outpatient and had received 2 days of oral antibiotics for presumed urinary tract infection. She had also received intramuscular ciprofloxacin. She was sent to the hospital due to a complaint of abdominal pain and was found to have drainage from a right gluteal abscess in the ER. She underwent incision and drainage on 9/29.    Subjective: She has no complaints but is moaning. No complaints of nausea, abdominal pain or shortness of breath.  Assessment/Plan: Principal Problem:   Abscess, gluteal, right/ sepsis - With leukocytosis & hypothermia -Initial wound culture revealed staph-blood cultures 2 sets negative so far -Continue vancomycin-stop Zosyn-WBC count steadily improving however, she continues to be hypothermic  Active Problems: Hypothermia -Bear hugger, check TSH and free T4   Acute renal failure, dehydration, hyponatremia --Continue to hydrate-Will increase fluid rate today-PO intake is quite poor  Hypertension -Continue amlodipine  Hypothyroidism -Continue Synthroid and check thyroid functions as mentioned above  DIASTOLIC HEART FAILURE, CHRONIC This is only grade 1 per echo in 3/16- follow while giving hydration via IV    Anemia -Likely anemia of chronic disease-stable    Malnutrition of moderate degree -Continue supplements  Dementia? -Oriented only to person at this time  Gout Continue allopurinol   Code Status:     Code Status Orders        Start     Ordered   09/16/15 2143  Do not attempt resuscitation (DNR)   Continuous    Question Answer Comment  In the event of  cardiac or respiratory ARREST Do not call a "code blue"   In the event of cardiac or respiratory ARREST Do not perform Intubation, CPR, defibrillation or ACLS   In the event of cardiac or respiratory ARREST Use medication by any route, position, wound care, and other measures to relive pain and suffering. May use oxygen, suction and manual treatment of airway obstruction as needed for comfort.      09/16/15 2142    Advance Directive Documentation        Most Recent Value   Type of Advance Directive  Healthcare Power of [place], Living will [Sarah Deziyah Arvin POA]   Pre-existing out of facility DNR order (yellow form or pink MOST form)     "MOST" Form in Place?       Family Communication:  Disposition Plan: Continue to follow in step down unit DVT prophylaxis: Heparin Consultants: Gen. surgery Procedures: Incision and drainage  Antibiotics: Anti-infectives    Start     Dose/Rate Route Frequency Ordered Stop   09/17/15 1400  vancomycin (VANCOCIN) IVPB 1000 mg/200 mL premix     1,000 mg 200 mL/hr over 60 Minutes Intravenous Every 24 hours 09/16/15 1325     09/16/15 2200  piperacillin-tazobactam (ZOSYN) IVPB 3.375 g     3.375 g 12.5 mL/hr over 240 Minutes Intravenous 3 times per day 09/16/15 1325     09/16/15 1300  piperacillin-tazobactam (ZOSYN) IVPB 3.375 g     3.375 g 100 mL/hr over 30 Minutes Intravenous  Once 09/16/15 1252 09/16/15 1359   09/16/15 1300  vancomycin (VANCOCIN) IVPB 1000 mg/200 mL premix     1,000 mg  200 mL/hr over 60 Minutes Intravenous  Once 09/16/15 1252 09/16/15 1430      Objective: Filed Weights   09/16/15 1305 09/16/15 2130 09/18/15 0413  Weight: 59.421 kg (131 lb) 60.918 kg (134 lb 4.8 oz) 65.227 kg (143 lb 12.8 oz)    Intake/Output Summary (Last 24 hours) at 09/18/15 1438 Last data filed at 09/18/15 1431  Gross per 24 hour  Intake   2075 ml  Output   1251 ml  Net    824 ml     Vitals Filed Vitals:   09/18/15 1050 09/18/15 1107 09/18/15  1333 09/18/15 1400  BP: 157/60   145/54  Pulse: 64     Temp: 94.1 F (34.5 C) 94.1 F (34.5 C) 94.3 F (34.6 C)   TempSrc: Rectal Rectal Rectal   Resp: 21   24  Height:      Weight:      SpO2: 100%       Exam:  General:  Pt is alert and agitated-not oriented to time or place, not in acute distress  HEENT: No icterus, No thrush, oral mucosa moist  Cardiovascular: regular rate and rhythm, S1/S2 No murmur  Respiratory: clear to auscultation bilaterally   Abdomen: Soft, +Bowel sounds, non tender, non distended, no guarding  MSK: No LE edema, cyanosis or clubbing  Skin: Penrose drains present in right buttock mild blood-tinged discharge on dressing  Data Reviewed: Basic Metabolic Panel:  Recent Labs Lab 09/16/15 1232 09/16/15 1535 09/17/15 0756  NA 130* 130* 134*  K 4.5 4.2 4.5  CL 99* 105 107  CO2 22 20* 20*  GLUCOSE 107* 125* 94  BUN 32* 28* 22*  CREATININE 1.54* 1.22* 1.08*  CALCIUM 9.9 8.7* 8.6*   Liver Function Tests:  Recent Labs Lab 09/16/15 1232 09/17/15 0756  AST 40 32  ALT 35 28  ALKPHOS 144* 132*  BILITOT 0.4 0.9  PROT 5.3* 4.2*  ALBUMIN 1.8* 1.4*    Recent Labs Lab 09/16/15 1232  LIPASE 13*   No results for input(s): AMMONIA in the last 168 hours. CBC:  Recent Labs Lab 09/16/15 1232 09/17/15 0756 09/18/15 0200  WBC 30.5* 28.9* 23.5*  HGB 9.3* 8.8* 9.0*  HCT 27.5* 25.3* 25.3*  MCV 99.3 98.1 96.2  PLT 351 326 323   Cardiac Enzymes: No results for input(s): CKTOTAL, CKMB, CKMBINDEX, TROPONINI in the last 168 hours. BNP (last 3 results) No results for input(s): BNP in the last 8760 hours.  ProBNP (last 3 results) No results for input(s): PROBNP in the last 8760 hours.  CBG:  Recent Labs Lab 09/16/15 1720 09/16/15 2138 09/16/15 2139  GLUCAP 97 112* 128*    Recent Results (from the past 240 hour(s))  Blood Culture (routine x 2)     Status: None (Preliminary result)   Collection Time: 09/16/15  1:10 PM  Result Value  Ref Range Status   Specimen Description BLOOD LEFT ARM  Final   Special Requests BOTTLES DRAWN AEROBIC ONLY 5CC  Final   Culture NO GROWTH < 24 HOURS  Final   Report Status PENDING  Incomplete  Blood Culture (routine x 2)     Status: None (Preliminary result)   Collection Time: 09/16/15  1:25 PM  Result Value Ref Range Status   Specimen Description BLOOD RIGHT ARM  Final   Special Requests BOTTLES DRAWN AEROBIC ONLY 3CC  Final   Culture NO GROWTH < 24 HOURS  Final   Report Status PENDING  Incomplete  Urine culture  Status: None   Collection Time: 09/16/15  1:50 PM  Result Value Ref Range Status   Specimen Description URINE, RANDOM  Final   Special Requests NONE  Final   Culture NO GROWTH 1 DAY  Final   Report Status 09/17/2015 FINAL  Final  Anaerobic culture     Status: None (Preliminary result)   Collection Time: 09/16/15  6:28 PM  Result Value Ref Range Status   Specimen Description ABSCESS RIGHT BUTTOCKS  Final   Special Requests PATIENT ON FOLLOWING VANC  Final   Gram Stain   Final    ABUNDANT WBC PRESENT,BOTH PMN AND MONONUCLEAR NO SQUAMOUS EPITHELIAL CELLS SEEN RARE GRAM POSITIVE COCCI IN PAIRS Performed at [facility]    Culture PENDING  Incomplete   Report Status PENDING  Incomplete  Fungus Culture with Smear     Status: None (Preliminary result)   Collection Time: 09/16/15  6:28 PM  Result Value Ref Range Status   Specimen Description ABSCESS RIGHT BUTTOCKS  Final   Special Requests PATIENT ON FOLLOWING VANC  Final   Fungal Smear   Final    NO YEAST OR FUNGAL ELEMENTS SEEN Performed at [facility]    Culture   Final    CULTURE IN PROGRESS FOR FOUR WEEKS Performed at [facility]    Report Status PENDING  Incomplete  AFB culture with smear     Status: None (Preliminary result)   Collection Time: 09/16/15  6:28 PM  Result Value Ref Range Status   Specimen Description ABSCESS RIGHT BUTTOCKS  Final   Special Requests PATIENT ON  FOLLOWING VANC  Final   Acid Fast Smear   Final    NO ACID FAST BACILLI SEEN Performed at [facility]    Culture   Final    CULTURE WILL BE EXAMINED FOR 6 WEEKS BEFORE ISSUING A FINAL REPORT Performed at [facility]    Report Status PENDING  Incomplete  Culture, routine-abscess     Status: None (Preliminary result)   Collection Time: 09/16/15  6:28 PM  Result Value Ref Range Status   Specimen Description ABSCESS RIGHT BUTTOCKS  Final   Special Requests PATIENT ON FOLLOWING VANC  Final   Gram Stain   Final    ABUNDANT WBC PRESENT,BOTH PMN AND MONONUCLEAR NO SQUAMOUS EPITHELIAL CELLS SEEN RARE GRAM POSITIVE COCCI IN PAIRS Performed at [facility]    Culture   Final    FEW STAPHYLOCOCCUS AUREUS Note: RIFAMPIN AND GENTAMICIN SHOULD NOT BE USED AS SINGLE DRUGS FOR TREATMENT OF STAPH INFECTIONS. Performed at [facility]    Report Status PENDING  Incomplete  Surgical pcr screen     Status: Abnormal   Collection Time: 09/16/15 10:39 PM  Result Value Ref Range Status   MRSA, PCR POSITIVE (A) NEGATIVE Final   Staphylococcus aureus POSITIVE (A) NEGATIVE Final    Comment:        The Xpert SA Assay (FDA approved for NASAL specimens in patients over 81 years of age), is one component of a comprehensive surveillance program.  Test performance has been validated by [facility] for patients greater than or equal to 26 year old. It is not intended to diagnose infection nor to guide or monitor treatment.      Studies: No results found.  Scheduled Meds:  Scheduled Meds: . allopurinol  100 mg Oral Daily  . amLODipine  5 mg Oral Daily  . antiseptic oral rinse  7 mL Mouth  Rinse BID  . Chlorhexidine Gluconate Cloth  6 each Topical Q0600  . clonazePAM  1 mg Oral QHS  . feeding supplement (ENSURE ENLIVE)  237 mL Oral BID BM  . heparin  5,000 Units Subcutaneous 3 times per day  . levothyroxine  44 mcg Intravenous QAC breakfast  . mupirocin  ointment  1 application Nasal BID  . pantoprazole  40 mg Oral Q1200  . piperacillin-tazobactam (ZOSYN)  IV  3.375 g Intravenous 3 times per day  . polyethylene glycol  17 g Oral Daily  . sodium chloride  10-40 mL Intracatheter Q12H  . sodium chloride  3 mL Intravenous Q12H  . vancomycin  1,000 mg Intravenous Q24H   Continuous Infusions: . sodium chloride 75 mL/hr at 09/18/15 0200    Time spent on care of this patient: 35 min   Marcellius Montagna, MD 09/18/2015, 2:38 PM  LOS: 2 days   [facility] Office  [phone] Pager - Text Page per [email or web address] If 7PM-7AM, please contact night-coverage [email or web address]

## 2015-09-18 NOTE — Progress Notes (Signed)
RT Note: Pt & son refusing CPAP at this time. He states" she has dementia and pulls everything off and she has never worn one at home since her sleep study years ago". I told them to call if they change their minds

## 2015-09-19 DIAGNOSIS — I5032 Chronic diastolic (congestive) heart failure: Secondary | ICD-10-CM

## 2015-09-19 LAB — CBC
HCT: 26.1 % — ABNORMAL LOW (ref 36.0–46.0)
HEMATOCRIT: 23.3 % — AB (ref 36.0–46.0)
Hemoglobin: 8.4 g/dL — ABNORMAL LOW (ref 12.0–15.0)
Hemoglobin: 9.2 g/dL — ABNORMAL LOW (ref 12.0–15.0)
MCH: 35.1 pg — ABNORMAL HIGH (ref 26.0–34.0)
MCH: 34.1 pg — AB (ref 26.0–34.0)
MCHC: 36.1 g/dL — AB (ref 30.0–36.0)
MCHC: 35.2 g/dL (ref 30.0–36.0)
MCV: 97.5 fL (ref 78.0–100.0)
MCV: 96.7 fL (ref 78.0–100.0)
PLATELETS: 282 10*3/uL (ref 150–400)
Platelets: 275 10*3/uL (ref 150–400)
RBC: 2.39 MIL/uL — ABNORMAL LOW (ref 3.87–5.11)
RBC: 2.7 MIL/uL — ABNORMAL LOW (ref 3.87–5.11)
RDW: 13.7 % (ref 11.5–15.5)
RDW: 13.6 % (ref 11.5–15.5)
WBC: 13.3 10*3/uL — ABNORMAL HIGH (ref 4.0–10.5)
WBC: 14 10*3/uL — ABNORMAL HIGH (ref 4.0–10.5)

## 2015-09-19 LAB — CULTURE, ROUTINE-ABSCESS

## 2015-09-19 LAB — BASIC METABOLIC PANEL
Anion gap: 8 (ref 5–15)
Anion gap: 7 (ref 5–15)
BUN: 14 mg/dL (ref 6–20)
BUN: 13 mg/dL (ref 6–20)
CALCIUM: 7.8 mg/dL — AB (ref 8.9–10.3)
CO2: 22 mmol/L (ref 22–32)
CO2: 21 mmol/L — AB (ref 22–32)
CREATININE: 0.83 mg/dL (ref 0.44–1.00)
CREATININE: 0.73 mg/dL (ref 0.44–1.00)
Calcium: 7.5 mg/dL — ABNORMAL LOW (ref 8.9–10.3)
Chloride: 99 mmol/L — ABNORMAL LOW (ref 101–111)
Chloride: 104 mmol/L (ref 101–111)
GFR calc Af Amer: >60 mL/min (ref >60)
GFR calc Af Amer: >60 mL/min (ref >60)
GFR calc non Af Amer: >60 mL/min (ref >60)
GLUCOSE: 100 mg/dL — AB (ref 65–99)
GLUCOSE: 87 mg/dL (ref 65–99)
POTASSIUM: 3.3 mmol/L — AB (ref 3.5–5.1)
Potassium: 3.1 mmol/L — ABNORMAL LOW (ref 3.5–5.1)
Sodium: 129 mmol/L — ABNORMAL LOW (ref 135–145)
Sodium: 132 mmol/L — ABNORMAL LOW (ref 135–145)

## 2015-09-19 LAB — VANCOMYCIN, TROUGH: VANCOMYCIN TR: 19 ug/mL (ref 10.0–20.0)

## 2015-09-19 MED ORDER — LEVOTHYROXINE SODIUM 88 MCG PO TABS
88.0000 ug | ORAL_TABLET | Freq: Every day | ORAL | Status: DC
  Administered 2015-09-19 – 2015-09-21 (×3): 88 ug via ORAL
  Filled 2015-09-19 (×3): qty 1

## 2015-09-19 MED ORDER — ENSURE ENLIVE PO LIQD
237.0000 mL | Freq: Four times a day (QID) | ORAL | Status: DC
  Administered 2015-09-19 (×4): 237 mL via ORAL

## 2015-09-19 MED ORDER — ATORVASTATIN CALCIUM 20 MG PO TABS
20.0000 mg | ORAL_TABLET | Freq: Every day | ORAL | Status: DC
  Administered 2015-09-19 – 2015-09-20 (×2): 20 mg via ORAL
  Filled 2015-09-19 (×2): qty 1

## 2015-09-19 MED ORDER — POTASSIUM CHLORIDE CRYS ER 20 MEQ PO TBCR
40.0000 meq | EXTENDED_RELEASE_TABLET | Freq: Once | ORAL | Status: AC
  Administered 2015-09-19: 40 meq via ORAL
  Filled 2015-09-19: qty 2

## 2015-09-19 MED ORDER — MUSCLE RUB 10-15 % EX CREA
TOPICAL_CREAM | CUTANEOUS | Status: DC | PRN
  Filled 2015-09-19: qty 85

## 2015-09-19 NOTE — Progress Notes (Signed)
[facility] Progress Note   Jillian Wheeler  ZOX:[id number]  DOB: 12-08-1925  DOA: 09/16/2015 PCP: Hoyle Sauer, MD  Brief narrative: Jillian Wheeler is a 81 y.o. female from assisted living facility with obstructive sleep apnea, dementia, chronic diastolic heart failure, recurrent hyponatremia who was admitted with a right gluteal abscess. She was noted to have leukocytosis as outpatient and had received 2 days of oral antibiotics for presumed urinary tract infection. She had also received intramuscular ciprofloxacin. She was sent to the hospital due to a complaint of abdominal pain and was found to have drainage from a right gluteal abscess in the ER. She underwent incision and drainage on 9/29.    Subjective: Complains of mid upper and lower back pain. No cough, shortness of breath, nausea, vomiting or abdominal pain.  Assessment/Plan: Principal Problem:   Abscess, gluteal, right/ sepsis - With leukocytosis & hypothermia -wound culture reveals MRSA- continue vancomycin for now-will transition to doxycycline when she is ready to discharge -blood cultures 2 sets negative so far  Active Problems: Hypothermia -Resolved - likely secondary to sepsis - TSH and free T4 within normal limits  Acute renal failure, dehydration, hyponatremia --Continue to hydrate--PO intake is quite poor  Hypokalemia - replace and recheck in a.m.  Hypertension -Continue amlodipine  Hypothyroidism -Continue Synthroid -thyroid function tests within normal limits  DIASTOLIC HEART FAILURE, CHRONIC This is grade 1 per echo on 3/16 - follow I and O, daily weights and oxygen saturation while giving IV hydration     Anemia -Likely anemia of chronic disease-stable    Malnutrition of moderate degree -Continue supplements  Dementia? Versus acute encephalopathy due to infection -According to daughter, her mentation has improved over the course of the past few days  Gout Continue  allopurinol   Code Status:     Code Status Orders        Start     Ordered   09/16/15 2143  Do not attempt resuscitation (DNR)   Continuous    Question Answer Comment  In the event of cardiac or respiratory ARREST Do not call a "code blue"   In the event of cardiac or respiratory ARREST Do not perform Intubation, CPR, defibrillation or ACLS   In the event of cardiac or respiratory ARREST Use medication by any route, position, wound care, and other measures to relive pain and suffering. May use oxygen, suction and manual treatment of airway obstruction as needed for comfort.      09/16/15 2142    Advance Directive Documentation        Most Recent Value   Type of Advance Directive  Healthcare Power of [place], Living will [Sarah Maralee Higuchi POA]   Pre-existing out of facility DNR order (yellow form or pink MOST form)     "MOST" Form in Place?       Family Communication: Daughter-Sarah Disposition Plan: Transitioned to MedSurg bed DVT prophylaxis: Heparin Consultants: Gen. surgery Procedures: Incision and drainage  Antibiotics: Anti-infectives    Start     Dose/Rate Route Frequency Ordered Stop   09/17/15 1400  vancomycin (VANCOCIN) IVPB 1000 mg/200 mL premix     1,000 mg 200 mL/hr over 60 Minutes Intravenous Every 24 hours 09/16/15 1325     09/16/15 2200  piperacillin-tazobactam (ZOSYN) IVPB 3.375 g  Status:  Discontinued     3.375 g 12.5 mL/hr over 240 Minutes Intravenous 3 times per day 09/16/15 1325 09/18/15 1455   09/16/15 1300  piperacillin-tazobactam (ZOSYN) IVPB 3.375 g  3.375 g 100 mL/hr over 30 Minutes Intravenous  Once 09/16/15 1252 09/16/15 1359   09/16/15 1300  vancomycin (VANCOCIN) IVPB 1000 mg/200 mL premix     1,000 mg 200 mL/hr over 60 Minutes Intravenous  Once 09/16/15 1252 09/16/15 1430      Objective: Filed Weights   09/16/15 2130 09/18/15 0413 09/19/15 0353  Weight: 60.918 kg (134 lb 4.8 oz) 65.227 kg (143 lb 12.8 oz) 61.598 kg (135 lb 12.8  oz)    Intake/Output Summary (Last 24 hours) at 09/19/15 1033 Last data filed at 09/19/15 0600  Gross per 24 hour  Intake   2600 ml  Output   1480 ml  Net   1120 ml     Vitals Filed Vitals:   09/19/15 0353 09/19/15 0400 09/19/15 0600 09/19/15 0800  BP: 153/55 153/55 167/53 159/52  Pulse:    63  Temp: 97.5 F (36.4 C)   97.7 F (36.5 C)  TempSrc: Oral   Oral  Resp: 32 28 31 32  Height:      Weight: 61.598 kg (135 lb 12.8 oz)     SpO2: 98%   98%    Exam:  General:  Pt is alert -not oriented to time or place, not in acute distress  HEENT: No icterus, No thrush, oral mucosa moist  Cardiovascular: regular rate and rhythm, S1/S2 No murmur  Respiratory: clear to auscultation bilaterally   Abdomen: Soft, +Bowel sounds, non tender, non distended, no guarding  MSK: No LE edema, cyanosis or clubbing  Skin: Penrose drains present in right buttock mild yellow discharge on dressing  Data Reviewed: Basic Metabolic Panel:  Recent Labs Lab 09/16/15 1232 09/16/15 1535 09/17/15 0756 09/19/15 0230 09/19/15 0750  NA 130* 130* 134* 129* 132*  K 4.5 4.2 4.5 3.3* 3.1*  CL 99* 105 107 99* 104  CO2 22 20* 20* 22 21*  GLUCOSE 107* 125* 94 100* 87  BUN 32* 28* 22* 14 13  CREATININE 1.54* 1.22* 1.08* 0.83 0.73  CALCIUM 9.9 8.7* 8.6* 7.5* 7.8*   Liver Function Tests:  Recent Labs Lab 09/16/15 1232 09/17/15 0756  AST 40 32  ALT 35 28  ALKPHOS 144* 132*  BILITOT 0.4 0.9  PROT 5.3* 4.2*  ALBUMIN 1.8* 1.4*    Recent Labs Lab 09/16/15 1232  LIPASE 13*   No results for input(s): AMMONIA in the last 168 hours. CBC:  Recent Labs Lab 09/16/15 1232 09/17/15 0756 09/18/15 0200 09/19/15 0230 09/19/15 0750  WBC 30.5* 28.9* 23.5* 13.3* 14.0*  HGB 9.3* 8.8* 9.0* 8.4* 9.2*  HCT 27.5* 25.3* 25.3* 23.3* 26.1*  MCV 99.3 98.1 96.2 97.5 96.7  PLT 351 326 323 275 282   Cardiac Enzymes: No results for input(s): CKTOTAL, CKMB, CKMBINDEX, TROPONINI in the last 168  hours. BNP (last 3 results) No results for input(s): BNP in the last 8760 hours.  ProBNP (last 3 results) No results for input(s): PROBNP in the last 8760 hours.  CBG:  Recent Labs Lab 09/16/15 1720 09/16/15 2138 09/16/15 2139  GLUCAP 97 112* 128*    Recent Results (from the past 240 hour(s))  Blood Culture (routine x 2)     Status: None (Preliminary result)   Collection Time: 09/16/15  1:10 PM  Result Value Ref Range Status   Specimen Description BLOOD LEFT ARM  Final   Special Requests BOTTLES DRAWN AEROBIC ONLY 5CC  Final   Culture NO GROWTH 2 DAYS  Final   Report Status PENDING  Incomplete  Blood  Culture (routine x 2)     Status: None (Preliminary result)   Collection Time: 09/16/15  1:25 PM  Result Value Ref Range Status   Specimen Description BLOOD RIGHT ARM  Final   Special Requests BOTTLES DRAWN AEROBIC ONLY 3CC  Final   Culture NO GROWTH 2 DAYS  Final   Report Status PENDING  Incomplete  Urine culture     Status: None   Collection Time: 09/16/15  1:50 PM  Result Value Ref Range Status   Specimen Description URINE, RANDOM  Final   Special Requests NONE  Final   Culture NO GROWTH 1 DAY  Final   Report Status 09/17/2015 FINAL  Final  Anaerobic culture     Status: None (Preliminary result)   Collection Time: 09/16/15  6:28 PM  Result Value Ref Range Status   Specimen Description ABSCESS RIGHT BUTTOCKS  Final   Special Requests PATIENT ON FOLLOWING VANC  Final   Gram Stain   Final    ABUNDANT WBC PRESENT,BOTH PMN AND MONONUCLEAR NO SQUAMOUS EPITHELIAL CELLS SEEN RARE GRAM POSITIVE COCCI IN PAIRS Performed at [facility]    Culture PENDING  Incomplete   Report Status PENDING  Incomplete  Fungus Culture with Smear     Status: None (Preliminary result)   Collection Time: 09/16/15  6:28 PM  Result Value Ref Range Status   Specimen Description ABSCESS RIGHT BUTTOCKS  Final   Special Requests PATIENT ON FOLLOWING VANC  Final   Fungal Smear   Final     NO YEAST OR FUNGAL ELEMENTS SEEN Performed at [facility]    Culture   Final    CULTURE IN PROGRESS FOR FOUR WEEKS Performed at [facility]    Report Status PENDING  Incomplete  AFB culture with smear     Status: None (Preliminary result)   Collection Time: 09/16/15  6:28 PM  Result Value Ref Range Status   Specimen Description ABSCESS RIGHT BUTTOCKS  Final   Special Requests PATIENT ON FOLLOWING VANC  Final   Acid Fast Smear   Final    NO ACID FAST BACILLI SEEN Performed at [facility]    Culture   Final    CULTURE WILL BE EXAMINED FOR 6 WEEKS BEFORE ISSUING A FINAL REPORT Performed at [facility]    Report Status PENDING  Incomplete  Culture, routine-abscess     Status: None   Collection Time: 09/16/15  6:28 PM  Result Value Ref Range Status   Specimen Description ABSCESS RIGHT BUTTOCKS  Final   Special Requests PATIENT ON FOLLOWING VANC  Final   Gram Stain   Final    ABUNDANT WBC PRESENT,BOTH PMN AND MONONUCLEAR NO SQUAMOUS EPITHELIAL CELLS SEEN RARE GRAM POSITIVE COCCI IN PAIRS Performed at [facility]    Culture   Final    FEW METHICILLIN RESISTANT STAPHYLOCOCCUS AUREUS Note: RIFAMPIN AND GENTAMICIN SHOULD NOT BE USED AS SINGLE DRUGS FOR TREATMENT OF STAPH INFECTIONS. CRITICAL RESULT CALLED TO, READ BACK BY AND VERIFIED WITH: TAMMY P. AT 9:30AM ON 09/19/2015 HAJAM Performed at [facility]    Report Status 09/19/2015 FINAL  Final   Organism ID, Bacteria METHICILLIN RESISTANT STAPHYLOCOCCUS AUREUS  Final      Susceptibility   Methicillin resistant staphylococcus aureus - MIC*    CLINDAMYCIN <=0.25 SENSITIVE Sensitive     ERYTHROMYCIN 0.5 SENSITIVE Sensitive     GENTAMICIN <=0.5 SENSITIVE Sensitive     LEVOFLOXACIN 4 INTERMEDIATE Intermediate  OXACILLIN >=4 RESISTANT Resistant     RIFAMPIN <=0.5 SENSITIVE Sensitive     TRIMETH/SULFA <=10 SENSITIVE Sensitive     VANCOMYCIN <=0.5 SENSITIVE Sensitive      TETRACYCLINE <=1 SENSITIVE Sensitive     * FEW METHICILLIN RESISTANT STAPHYLOCOCCUS AUREUS  Surgical pcr screen     Status: Abnormal   Collection Time: 09/16/15 10:39 PM  Result Value Ref Range Status   MRSA, PCR POSITIVE (A) NEGATIVE Final   Staphylococcus aureus POSITIVE (A) NEGATIVE Final    Comment:        The Xpert SA Assay (FDA approved for NASAL specimens in patients over 74 years of age), is one component of a comprehensive surveillance program.  Test performance has been validated by [facility] for patients greater than or equal to 55 year old. It is not intended to diagnose infection nor to guide or monitor treatment.      Studies: No results found.  Scheduled Meds:  Scheduled Meds: . allopurinol  100 mg Oral Daily  . amLODipine  5 mg Oral Daily  . antiseptic oral rinse  7 mL Mouth Rinse BID  . atorvastatin  20 mg Oral q1800  . Chlorhexidine Gluconate Cloth  6 each Topical Q0600  . clonazePAM  1 mg Oral QHS  . feeding supplement (ENSURE ENLIVE)  237 mL Oral QID  . heparin  5,000 Units Subcutaneous 3 times per day  . levothyroxine  88 mcg Oral QAC breakfast  . mupirocin ointment  1 application Nasal BID  . pantoprazole  40 mg Oral Q1200  . polyethylene glycol  17 g Oral Daily  . sodium chloride  10-40 mL Intracatheter Q12H  . sodium chloride  3 mL Intravenous Q12H  . vancomycin  1,000 mg Intravenous Q24H   Continuous Infusions: . sodium chloride 125 mL/hr at 09/19/15 0926    Time spent on care of this patient: 35 min   Madailein Londo, MD 09/19/2015, 10:33 AM  LOS: 3 days   [facility] Office  [phone] Pager - Text Page per [email or web address] If 7PM-7AM, please contact night-coverage [email or web address]

## 2015-09-19 NOTE — Clinical Social Work Note (Signed)
Clinical Social Work Assessment  Patient Details  Name: Jillian Wheeler MRN: [id number] Date of Birth: 01/30/1925  Date of referral:  09/19/15               Reason for consult:  Facility Placement ([facility], possible SNF at DC)                Permission sought to share information with:  Case Production designer, theatre/television/film, Magazine features editor, Family Supports Permission granted to share information::  Yes, Verbal Permission Granted  Name::        Agency::  [employer] as well as referral for SNF if higher level of care needed  Relationship::  Son and daughter  Solicitor Information:     Housing/Transportation Living arrangements for the past 2 months:  Assisted Dealer of Information:  Medical Team, Adult Children Patient Interpreter Needed:  None Criminal Activity/Legal Involvement Pertinent to Current Situation/Hospitalization:  No - Comment as needed Significant Relationships:  Adult Children, Merchandiser, retail, Network engineer, Other Family Members, Friend Lives with:  Facility Resident Do you feel safe going back to the place where you live?  Yes (family wanting her to go back if at all possible, open to SNF if ALF cannot manage patient) Need for family participation in patient care:  Yes (Comment) (by request and POA is daughter)  Care giving concerns:  Received call back from daughter and son who requested a family meeting this afternoon. Completed meeting at 3:15pm.  Family reports patient has decompensated over the last 8 months. Has been recently residing moderately independent at ALF [facility].  Reports patient was managing care in facility and family is paying out of pocket for ALF.  Patient has been to SNF and another ALF this past year and family would really want her to return to ALF if at all possible.  Patient needing more care at this time per PT notes and SNF being recommended.  Had long discussion with family regarding insurance authorization, SNF  benefits, and ultimate outcomes.  Family feels patient should return to ALF, but open to beginning the SNF search if this is what is needed even for a short time.   Social Worker assessment / plan:  Completed family meeting and assessment of patient needs per new and old baseline with family information.  Beginning process for patient with SNF search and [employer] Medicare ([employer]).  Family is agreeable to send referrals out with hopes of not needing SNF at DC and returning to ALF, but if needed will comply with best course of treatment for patient. Patient has been to [facility] in past and this is not an option for patient to return per family. They are more open to [facility] and other options that [employer] is in network with. LCSW will begin the SNF process and have unit CSW to follow up in AM with bed offers and [facility] authorization. Per family patient should be ready for DC middle of week.  Employment status:  Retired Database administrator PT Recommendations:  Radio producer, Skilled Teacher, early years/pre / Referral to community resources:  Skilled Holiday representative, Other (Comment Required) (home health agencies for ALF)  Patient/Family's Response to care:  Family is very negative when speaking about [facility] and treatment. They are upset with previous visits to [facility] and are wanting full engagement with staff and call placed to family routinely.  Family is agreeable with above plan mentioned specifically with SNF being started so they do  not feel like they are rushed to make a decision.  Patient/Family's Understanding of and Emotional Response to Diagnosis, Current Treatment, and Prognosis:  Patient unable to participate, thus meeting completed with family per there request. They are understanding and documented every bit of the resources given and are hyper anxious to have a plan and action today.  They are understanding it is the weekend and limited can be done to  answer all insurance authorization questions and bed offers.  Aware of her treatment and appear to be very supportive of patient and overly involved.  Emotional Assessment Appearance:  Appears stated age Attitude/Demeanor/Rapport:  Unresponsive, Other (patient resting in bed, assessment with family) Affect (typically observed):  Unable to Assess Orientation:  Oriented to Self Alcohol / Substance use:  Not Applicable Psych involvement (Current and /or in the community):  No (Comment)  Discharge Needs  Concerns to be addressed:  Adjustment to Illness, Care Coordination, Financial / Insurance Concerns Readmission within the last 30 days:  No Current discharge risk:  Chronically ill Barriers to Discharge:  English as a second language teacher, Continued Medical Work up   Raye Sorrow, LCSW 09/19/2015, 4:04 PM

## 2015-09-19 NOTE — Progress Notes (Signed)
Pt transferred from [facility] to 6E04 via bed. Moved onto air mattress bed. MRSA precautions in place. Cardiac monitor on, and central telemetry notified. Family at bedside during transfer. R hand PIV intact and infusing with vancomycin infusing. Patient rating R hip pain 9/10. Pink foam dressing intact.  VSS. Will contnue to monitor. Bess Kinds, RN

## 2015-09-19 NOTE — Progress Notes (Signed)
3 Days Post-Op  Subjective: Awake this morning  Objective: Vital signs in last 24 hours: Temp:  [93.8 F (34.3 C)-97.5 F (36.4 C)] 97.5 F (36.4 C) (10/02 0353) Pulse Rate:  [64-67] 67 (10/01 1919) Resp:  [17-33] 31 (10/02 0600) BP: (138-167)/(42-100) 167/53 mmHg (10/02 0600) SpO2:  [90 %-100 %] 98 % (10/02 0353) Weight:  [61.598 kg (135 lb 12.8 oz)] 61.598 kg (135 lb 12.8 oz) (10/02 0353) Last BM Date: 09/17/15  Intake/Output from previous day: 10/01 0701 - 10/02 0700 In: 2925 [P.O.:400; I.V.:2325; IV Piggyback:200] Out: 1780 [Urine:1780] Intake/Output this shift:    No erythema but some purulence can be expressed from upper wound.  Penrose is intact  Lab Results:   Recent Labs  09/18/15 0200 09/19/15 0230  WBC 23.5* 13.3*  HGB 9.0* 8.4*  HCT 25.3* 23.3*  PLT 323 275   BMET  Recent Labs  09/17/15 0756 09/19/15 0230  NA 134* 129*  K 4.5 3.3*  CL 107 99*  CO2 20* 22  GLUCOSE 94 100*  BUN 22* 14  CREATININE 1.08* 0.83  CALCIUM 8.6* 7.5*   PT/INR No results for input(s): LABPROT, INR in the last 72 hours. ABG No results for input(s): PHART, HCO3 in the last 72 hours.  Invalid input(s): PCO2, PO2  Studies/Results: No results found.  Anti-infectives: Anti-infectives    Start     Dose/Rate Route Frequency Ordered Stop   09/17/15 1400  vancomycin (VANCOCIN) IVPB 1000 mg/200 mL premix     1,000 mg 200 mL/hr over 60 Minutes Intravenous Every 24 hours 09/16/15 1325     09/16/15 2200  piperacillin-tazobactam (ZOSYN) IVPB 3.375 g  Status:  Discontinued     3.375 g 12.5 mL/hr over 240 Minutes Intravenous 3 times per day 09/16/15 1325 09/18/15 1455   09/16/15 1300  piperacillin-tazobactam (ZOSYN) IVPB 3.375 g     3.375 g 100 mL/hr over 30 Minutes Intravenous  Once 09/16/15 1252 09/16/15 1359   09/16/15 1300  vancomycin (VANCOCIN) IVPB 1000 mg/200 mL premix     1,000 mg 200 mL/hr over 60 Minutes Intravenous  Once 09/16/15 1252 09/16/15 1430       Assessment/Plan: s/p Procedure(s): IRRIGATION AND DEBRIDEMENT AND DRAINAGE OR RIGHT BUTTOCK WOUND  (N/A)  WBC down but may be an error.  Repeat labs pending To start hydrotherapy on the wounds.  Will leave penrose in place.  From a wound standpoint, she may be ready for discharge in a day or so.  LOS: 3 days    Jonise Weightman A 09/19/2015

## 2015-09-19 NOTE — Progress Notes (Signed)
LCSW aware of consult and family wanting information about disposition and discharge planning. Patient current resident at [facility]. Only family in room was a granddaughter in law who could not provide much information.   Call placed to daughter and message left with information to call back.  Will await call back to advise about discharge planning and information for resources. Will follow up once call returned.  Deretha Emory, MSW Clinical Social Work: Emergency Room [phone]

## 2015-09-19 NOTE — Progress Notes (Signed)
Report given to RN Toniann Fail on unit 6 [place], pt to move to 6E04 non telem bed.  Pt and family member at bedside already made aware of new room number.

## 2015-09-19 NOTE — Progress Notes (Signed)
Pt transferred via bed with belongings to unit 6E04, family with pt at time of transfer.

## 2015-09-19 NOTE — Clinical Social Work Placement (Signed)
   CLINICAL SOCIAL WORK PLACEMENT  NOTE  Date:  09/19/2015  Patient Details  Name: PASCUALA KLUTTS MRN: [id number] Date of Birth: 1925-10-10  Clinical Social Work is seeking post-discharge placement for this patient at the Skilled  Nursing Facility level of care (*CSW will initial, date and re-position this form in  chart as items are completed):  Yes   Patient/family provided with [place] Clinical Social Work Department's list of facilities offering this level of care within the geographic area requested by the patient (or if unable, by the patient's family).  Yes   Patient/family informed of their freedom to choose among providers that offer the needed level of care, that participate in Medicare, Medicaid or managed care program needed by the patient, have an available bed and are willing to accept the patient.  Yes   Patient/family informed of [facility]'s ownership interest in [facility] and [facility], as well as of the fact that they are under no obligation to receive care at these facilities.  PASRR submitted to EDS on       PASRR number received on       Existing PASRR number confirmed on 09/19/15     FL2 transmitted to all facilities in geographic area requested by pt/family on 09/19/15     FL2 transmitted to all facilities within larger geographic area on       Patient informed that his/her managed care company has contracts with or will negotiate with certain facilities, including the following:            Patient/family informed of bed offers received.  Patient chooses bed at       Physician recommends and patient chooses bed at      Patient to be transferred to   on  .  Patient to be transferred to facility by       Patient family notified on   of transfer.  Name of family member notified:        PHYSICIAN Please prepare priority discharge summary, including medications, Please prepare prescriptions, Please sign DNR, Please sign FL2      Additional Comment:    _______________________________________________ Raye Sorrow, LCSW 09/19/2015, 4:34 PM

## 2015-09-19 NOTE — Progress Notes (Addendum)
ANTIBIOTIC CONSULT NOTE - Follow Up Consult  Pharmacy Consult for vancomycin Indication: wound abscess  Allergies  Allergen Reactions  . Aspirin Other (See Comments)    Upset stomach  . Celecoxib Other (See Comments)    Upset stomach  . Diclofenac Sodium Other (See Comments)    Pt does not remember   . Ibuprofen Other (See Comments)    Upset stomach   . Naproxen Other (See Comments)    Upset stomach   . Sulindac Other (See Comments)    Pt does not remember     Patient Measurements: Height:  (162.6 cm) Weight: 135 lb 12.8 oz (61.598 kg) IBW/kg (Calculated) : 54.7   Vital Signs: Temp: 97.6 F (36.4 C) (10/02 1155) Temp Source: Oral (10/02 1155) BP: 135/86 mmHg (10/02 1155) Pulse Rate: 99 (10/02 1155) Intake/Output from previous day: 10/01 0701 - 10/02 0700 In: 2925 [P.O.:400; I.V.:2325; IV Piggyback:200] Out: 1780 [Urine:1780] Intake/Output from this shift: Total I/O In: 360 [P.O.:360] Out: -   Labs:  Recent Labs  09/17/15 0756 09/18/15 0200 09/19/15 0230 09/19/15 0750  WBC 28.9* 23.5* 13.3* 14.0*  HGB 8.8* 9.0* 8.4* 9.2*  PLT 326 323 275 282  CREATININE 1.08*  --  0.83 0.73   Estimated Creatinine Clearance: 42 mL/min (by C-G formula based on Cr of 0.73).  Recent Labs  09/19/15 1305  VANCOTROUGH 19     Microbiology: Recent Results (from the past 720 hour(s))  Blood Culture (routine x 2)     Status: None (Preliminary result)   Collection Time: 09/16/15  1:10 PM  Result Value Ref Range Status   Specimen Description BLOOD LEFT ARM  Final   Special Requests BOTTLES DRAWN AEROBIC ONLY 5CC  Final   Culture NO GROWTH 2 DAYS  Final   Report Status PENDING  Incomplete  Blood Culture (routine x 2)     Status: None (Preliminary result)   Collection Time: 09/16/15  1:25 PM  Result Value Ref Range Status   Specimen Description BLOOD RIGHT ARM  Final   Special Requests BOTTLES DRAWN AEROBIC ONLY 3CC  Final   Culture NO GROWTH 2 DAYS  Final   Report Status PENDING  Incomplete  Urine culture     Status: None   Collection Time: 09/16/15  1:50 PM  Result Value Ref Range Status   Specimen Description URINE, RANDOM  Final   Special Requests NONE  Final   Culture NO GROWTH 1 DAY  Final   Report Status 09/17/2015 FINAL  Final  Anaerobic culture     Status: None (Preliminary result)   Collection Time: 09/16/15  6:28 PM  Result Value Ref Range Status   Specimen Description ABSCESS RIGHT BUTTOCKS  Final   Special Requests PATIENT ON FOLLOWING VANC  Final   Gram Stain   Final    ABUNDANT WBC PRESENT,BOTH PMN AND MONONUCLEAR NO SQUAMOUS EPITHELIAL CELLS SEEN RARE GRAM POSITIVE COCCI IN PAIRS Performed at [facility]    Culture   Final    NO ANAEROBES ISOLATED; CULTURE IN PROGRESS FOR 5 DAYS Performed at [facility]    Report Status PENDING  Incomplete  Fungus Culture with Smear     Status: None (Preliminary result)   Collection Time: 09/16/15  6:28 PM  Result Value Ref Range Status   Specimen Description ABSCESS RIGHT BUTTOCKS  Final   Special Requests PATIENT ON FOLLOWING VANC  Final   Fungal Smear   Final    NO YEAST OR FUNGAL ELEMENTS  SEEN Performed at [facility]   Final    CULTURE IN PROGRESS FOR FOUR WEEKS Performed at [facility]    Report Status PENDING  Incomplete  AFB culture with smear     Status: None (Preliminary result)   Collection Time: 09/16/15  6:28 PM  Result Value Ref Range Status   Specimen Description ABSCESS RIGHT BUTTOCKS  Final   Special Requests PATIENT ON FOLLOWING VANC  Final   Acid Fast Smear   Final    NO ACID FAST BACILLI SEEN Performed at [facility]    Culture   Final    CULTURE WILL BE EXAMINED FOR 6 WEEKS BEFORE ISSUING A FINAL REPORT Performed at [facility]    Report Status PENDING  Incomplete  Culture, routine-abscess     Status: None   Collection Time: 09/16/15  6:28 PM  Result Value Ref Range Status    Specimen Description ABSCESS RIGHT BUTTOCKS  Final   Special Requests PATIENT ON FOLLOWING VANC  Final   Gram Stain   Final    ABUNDANT WBC PRESENT,BOTH PMN AND MONONUCLEAR NO SQUAMOUS EPITHELIAL CELLS SEEN RARE GRAM POSITIVE COCCI IN PAIRS Performed at [facility]    Culture   Final    FEW METHICILLIN RESISTANT STAPHYLOCOCCUS AUREUS Note: RIFAMPIN AND GENTAMICIN SHOULD NOT BE USED AS SINGLE DRUGS FOR TREATMENT OF STAPH INFECTIONS. CRITICAL RESULT CALLED TO, READ BACK BY AND VERIFIED WITH: TAMMY P. AT 9:30AM ON 09/19/2015 HAJAM Performed at [facility]    Report Status 09/19/2015 FINAL  Final   Organism ID, Bacteria METHICILLIN RESISTANT STAPHYLOCOCCUS AUREUS  Final      Susceptibility   Methicillin resistant staphylococcus aureus - MIC*    CLINDAMYCIN <=0.25 SENSITIVE Sensitive     ERYTHROMYCIN 0.5 SENSITIVE Sensitive     GENTAMICIN <=0.5 SENSITIVE Sensitive     LEVOFLOXACIN 4 INTERMEDIATE Intermediate     OXACILLIN >=4 RESISTANT Resistant     RIFAMPIN <=0.5 SENSITIVE Sensitive     TRIMETH/SULFA <=10 SENSITIVE Sensitive     VANCOMYCIN <=0.5 SENSITIVE Sensitive     TETRACYCLINE <=1 SENSITIVE Sensitive     * FEW METHICILLIN RESISTANT STAPHYLOCOCCUS AUREUS  Surgical pcr screen     Status: Abnormal   Collection Time: 09/16/15 10:39 PM  Result Value Ref Range Status   MRSA, PCR POSITIVE (A) NEGATIVE Final   Staphylococcus aureus POSITIVE (A) NEGATIVE Final    Comment:        The Xpert SA Assay (FDA approved for NASAL specimens in patients over 54 years of age), is one component of a comprehensive surveillance program.  Test performance has been validated by [facility] for patients greater than or equal to 31 year old. It is not intended to diagnose infection nor to guide or monitor treatment.     Medical History: Past Medical History  Diagnosis Date  . Palpitations   . Unspecified fall   . Altered mental status   . Other nonspecific finding on  examination of urine   . Symptomatic menopausal or female climacteric states   . Insomnia, unspecified   . Contact dermatitis and other eczema, due to unspecified cause   . Type II or unspecified type diabetes mellitus with neurological manifestations, not stated as uncontrolled   . Dermatophytosis of nail   . Mastodynia   . Contact dermatitis and other eczema due to solvents   . Chronic kidney disease, stage III (moderate)   . Acute  bronchitis   . Wheezing   . Pain in joint, pelvic region and thigh   . Other speech disturbance(784.59)   . Unspecified acute reaction to stress   . Other malaise and fatigue   . Spasm of muscle   . Blood in stool   . Other specified noninflammatory disorder of vagina   . Osteoarthrosis, unspecified whether generalized or localized, unspecified site   . Gout, unspecified   . Adjustment disorder with mixed anxiety and depressed mood   . Other vitamin B12 deficiency anemia   . Obstructive sleep apnea (adult) (pediatric)   . Other dyspnea and respiratory abnormality   . Type II or unspecified type diabetes mellitus without mention of complication, not stated as uncontrolled   . Unspecified asthma(493.90)   . Esophageal reflux   . Other and unspecified hyperlipidemia   . Thyrotoxicosis without mention of goiter or other cause, without mention of thyrotoxic crisis or storm   . Unspecified essential hypertension   . 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 SIADH (syndrome of inappropriate ADH production) 05/09/2015    Medications:  Anti-infectives    Start     Dose/Rate Route Frequency Ordered Stop   09/17/15 1400  vancomycin (VANCOCIN) IVPB 1000 mg/200 mL premix     1,000 mg 200 mL/hr over 60 Minutes Intravenous Every 24 hours 09/16/15 1325     09/16/15 2200  piperacillin-tazobactam (ZOSYN) IVPB 3.375 g  Status:  Discontinued     3.375 g 12.5 mL/hr over 240 Minutes Intravenous 3 times per day 09/16/15 1325 09/18/15 1455   09/16/15 1300  piperacillin-tazobactam  (ZOSYN) IVPB 3.375 g     3.375 g 100 mL/hr over 30 Minutes Intravenous  Once 09/16/15 1252 09/16/15 1359   09/16/15 1300  vancomycin (VANCOCIN) IVPB 1000 mg/200 mL premix     1,000 mg 200 mL/hr over 60 Minutes Intravenous  Once 09/16/15 1252 09/16/15 1430     Assessment: 81 yo female admitted with sepsis 2/2 R gluteal abscess growing MRSA.   ID Day #4 abx. Afebrile, WBC 30-->14, LA 2.48>>1, PCT 0.33  9/29 vanc > 9/29 zosyn >10/02  9/29 blood>> ngtd 9/29 urine>> negative, final 9/29 gluteal abscess>> MRSA sens to vancomycin  Vancomycin trough 19 mcg/ml therapeutic. Drawn at steady state.   Goal of Therapy:  Vancomycin trough level 15-20 mcg/ml  Plan:  -Vancomycin 1 g IV q24h -Monitor renal fx -Check trough in 2-3 days to check for accumulation   Hillery Aldo, Pharm.D. PGY2  Pharmacy Resident Pager: [phone] 3:21 PM, 09/19/2015

## 2015-09-19 NOTE — Progress Notes (Signed)
Attempted to given report to [facility] nurse, nurse not available at moment to receive report, will return call

## 2015-09-20 LAB — CBC
HEMATOCRIT: 27 % — AB (ref 36.0–46.0)
Hemoglobin: 9.3 g/dL — ABNORMAL LOW (ref 12.0–15.0)
MCH: 33.7 pg (ref 26.0–34.0)
MCHC: 34.4 g/dL (ref 30.0–36.0)
MCV: 97.8 fL (ref 78.0–100.0)
Platelets: 286 10*3/uL (ref 150–400)
RBC: 2.76 MIL/uL — ABNORMAL LOW (ref 3.87–5.11)
RDW: 14.2 % (ref 11.5–15.5)
WBC: 14.4 10*3/uL — ABNORMAL HIGH (ref 4.0–10.5)

## 2015-09-20 LAB — BASIC METABOLIC PANEL
Anion gap: 5 (ref 5–15)
BUN: 15 mg/dL (ref 6–20)
CHLORIDE: 104 mmol/L (ref 101–111)
CO2: 21 mmol/L — ABNORMAL LOW (ref 22–32)
Calcium: 7.5 mg/dL — ABNORMAL LOW (ref 8.9–10.3)
Creatinine, Ser: 0.79 mg/dL (ref 0.44–1.00)
GFR calc Af Amer: >60 mL/min (ref >60)
GLUCOSE: 88 mg/dL (ref 65–99)
POTASSIUM: 3.7 mmol/L (ref 3.5–5.1)
Sodium: 130 mmol/L — ABNORMAL LOW (ref 135–145)

## 2015-09-20 LAB — C DIFFICILE QUICK SCREEN W PCR REFLEX
C Diff antigen: POSITIVE — AB
C Diff toxin: NEGATIVE

## 2015-09-20 LAB — PROCALCITONIN: PROCALCITONIN: 0.12 ng/mL

## 2015-09-20 MED ORDER — SODIUM CHLORIDE 0.9 % IV SOLN: INTRAVENOUS | Status: DC

## 2015-09-20 NOTE — Care Management Important Message (Signed)
Important Message  Patient Details  Name: [name] MRN: [id number] Date of Birth: 09-12-1925   Medicare Important Message Given:  Yes-second notification given    RoyalAnnamarie Major, RN 09/20/2015, 10:51 AM

## 2015-09-20 NOTE — Clinical Social Work Placement (Signed)
   CLINICAL SOCIAL WORK PLACEMENT  NOTE  Date:  09/20/2015  Patient Details  Name: Jillian Wheeler MRN: [id number] Date of Birth: 25-Sep-1925  Clinical Social Work is seeking post-discharge placement for this patient at the Skilled  Nursing Facility level of care (*CSW will initial, date and re-position this form in  chart as items are completed):  Yes   Patient/family provided with [facility] Department's list of facilities offering this level of care within the geographic area requested by the patient (or if unable, by the patient's family).  Yes   Patient/family informed of their freedom to choose among providers that offer the needed level of care, that participate in Medicare, Medicaid or managed care program needed by the patient, have an available bed and are willing to accept the patient.  Yes   Patient/family informed of 's ownership interest in [facility] and [facility], as well as of the fact that they are under no obligation to receive care at these facilities.  PASRR submitted to EDS on       PASRR number received on       Existing PASRR number confirmed on 09/19/15     FL2 transmitted to all facilities in geographic area requested by pt/family on 09/19/15     FL2 transmitted to all facilities within larger geographic area on       Patient informed that his/her managed care company has contracts with or will negotiate with certain facilities, including the following:         09/20/15 - Patient/family informed of bed offers received.  Patient chooses bed at       Physician recommends and patient chooses bed at      Patient to be transferred to   on  .  Patient to be transferred to facility by       Patient family notified on   of transfer.  Name of family member notified:        PHYSICIAN Please prepare priority discharge summary, including medications, Please prepare prescriptions, Please sign DNR, Please sign FL2      Additional Comment:  09/20/15 - CSW talked with patient's son Enda Santo at the patient's room and provided him with the SNF list with responses. Mr. Mcbain informed CSW that he will talk with family and get back with their decision.   _______________________________________________ Cristobal Goldmann, LCSW 09/20/2015, 5:06 PM

## 2015-09-20 NOTE — Progress Notes (Addendum)
[facility] Progress Note   Jillian Wheeler  ZOX:[id number]  DOB: 08-24-1925  DOA: 09/16/2015 PCP: Jillian Sauer, MD  Brief narrative: Jillian Wheeler is a 81 y.o. female from assisted living facility with obstructive sleep apnea, dementia, chronic diastolic heart failure, recurrent hyponatremia who was admitted with a right gluteal abscess. She was noted to have leukocytosis as outpatient and had received 2 days of oral antibiotics for presumed urinary tract infection. She had also received intramuscular ciprofloxacin. She was sent to the hospital due to a complaint of abdominal pain and was found to have drainage from a right gluteal abscess in the ER. She underwent incision and drainage on 9/29.    Subjective: No complaints today. No nausea vomiting abdominal pain or diarrhea. Per RN the patient started having diarrhea yesterday but after holding the Ensure, it appears to be improving.   Assessment/Plan: Principal Problem:   Abscess, gluteal, right/ sepsis - With leukocytosis & hypothermia - wound culture reveals MRSA- continue vancomycin for now - will transition to doxycycline when she is ready to discharge - blood cultures 2 sets negative so far - continues to have leukocytosis- cont to follow for improvement  Active Problems:  Diarrhea -C. difficile antigen positive and toxin negative which usually means she is colonized- diarrhea may have been from Ensure? -abdomen non-tender-eating and drinking without pain or vomiting-  will monitor-  - have asked RN to document all stools -  if loose stools continue, start Oral Vanc    Hypothermia -Resolved - likely secondary to sepsis - TSH and free T4 within normal limits  Acute renal failure, hyponatremia --Continue to hydrate--PO intake remains quite poor  Hypokalemia - replaced - due to ongoing diarrhea, recheck in a.m.  Hypertension -Continue amlodipine  Hypothyroidism -Continue Synthroid -thyroid function tests  within normal limits  DIASTOLIC HEART FAILURE, CHRONIC This is grade 1 per echo on 3/16 - follow I and O, daily weights and oxygen saturation while giving IV hydration     Anemia -Likely anemia of chronic disease-stable    Malnutrition of moderate degree -Continue supplements  Dementia versus acute encephalopathy due to infection -According to daughter, her mentation has improved over the course of the past few days -I agree that she does appear to be mentating quite well today and is oriented to place and person  Gout Continue allopurinol   Code Status:     Code Status Orders        Start     Ordered   09/16/15 2143  Do not attempt resuscitation (DNR)   Continuous    Question Answer Comment  In the event of cardiac or respiratory ARREST Do not call a "code blue"   In the event of cardiac or respiratory ARREST Do not perform Intubation, CPR, defibrillation or ACLS   In the event of cardiac or respiratory ARREST Use medication by any route, position, wound care, and other measures to relive pain and suffering. May use oxygen, suction and manual treatment of airway obstruction as needed for comfort.      09/16/15 2142    Advance Directive Documentation        Most Recent Value   Type of Advance Directive  Healthcare Power of [place], Living will [Jillian Wheeler POA]   Pre-existing out of facility DNR order (yellow form or pink MOST form)     "MOST" Form in Place?       Family Communication: Daughter-Jillian and son Jillian Wheeler  Disposition Plan: Transitioned to [facility]  bed- SNF in 1-2 days DVT prophylaxis: Heparin Consultants: Gen. surgery Procedures: Incision and drainage  Antibiotics: Anti-infectives    Start     Dose/Rate Route Frequency Ordered Stop   09/17/15 1400  vancomycin (VANCOCIN) IVPB 1000 mg/200 mL premix     1,000 mg 200 mL/hr over 60 Minutes Intravenous Every 24 hours 09/16/15 1325     09/16/15 2200  piperacillin-tazobactam (ZOSYN) IVPB 3.375 g   Status:  Discontinued     3.375 g 12.5 mL/hr over 240 Minutes Intravenous 3 times per day 09/16/15 1325 09/18/15 1455   09/16/15 1300  piperacillin-tazobactam (ZOSYN) IVPB 3.375 g     3.375 g 100 mL/hr over 30 Minutes Intravenous  Once 09/16/15 1252 09/16/15 1359   09/16/15 1300  vancomycin (VANCOCIN) IVPB 1000 mg/200 mL premix     1,000 mg 200 mL/hr over 60 Minutes Intravenous  Once 09/16/15 1252 09/16/15 1430      Objective: Filed Weights   09/16/15 2130 09/18/15 0413 09/19/15 0353  Weight: 60.918 kg (134 lb 4.8 oz) 65.227 kg (143 lb 12.8 oz) 61.598 kg (135 lb 12.8 oz)    Intake/Output Summary (Last 24 hours) at 09/20/15 1519 Last data filed at 09/20/15 1439  Gross per 24 hour  Intake 1560.83 ml  Output    511 ml  Net 1049.83 ml     Vitals Filed Vitals:   09/19/15 1729 09/19/15 2115 09/20/15 0607 09/20/15 0826  BP: 135/76 123/56 147/54 139/64  Pulse: 90 77 65 65  Temp: 97.4 F (36.3 C) 97.5 F (36.4 C) 97.9 F (36.6 C) 98 F (36.7 C)  TempSrc: Oral Oral Oral Oral  Resp: 20 20 23 18   Height:      Weight:      SpO2: 96% 99% 99% 100%    Exam:  General:  Pt is alert and oriented to place and person but not time, not in acute distress  HEENT: No icterus, No thrush, oral mucosa moist  Cardiovascular: regular rate and rhythm, S1/S2 No murmur  Respiratory: clear to auscultation bilaterally   Abdomen: Soft, +Bowel sounds, non tender, mildly distended, no guarding  MSK: No LE edema, cyanosis or clubbing  Skin: Penrose drains present in right buttock mild yellow discharge on dressing  Data Reviewed: Basic Metabolic Panel:  Recent Labs Lab 09/16/15 1535 09/17/15 0756 09/19/15 0230 09/19/15 0750 09/20/15 0417  NA 130* 134* 129* 132* 130*  K 4.2 4.5 3.3* 3.1* 3.7  CL 105 107 99* 104 104  CO2 20* 20* 22 21* 21*  GLUCOSE 125* 94 100* 87 88  BUN 28* 22* 14 13 15   CREATININE 1.22* 1.08* 0.83 0.73 0.79  CALCIUM 8.7* 8.6* 7.5* 7.8* 7.5*   Liver Function  Tests:  Recent Labs Lab 09/16/15 1232 09/17/15 0756  AST 40 32  ALT 35 28  ALKPHOS 144* 132*  BILITOT 0.4 0.9  PROT 5.3* 4.2*  ALBUMIN 1.8* 1.4*    Recent Labs Lab 09/16/15 1232  LIPASE 13*   No results for input(s): AMMONIA in the last 168 hours. CBC:  Recent Labs Lab 09/17/15 0756 09/18/15 0200 09/19/15 0230 09/19/15 0750 09/20/15 0417  WBC 28.9* 23.5* 13.3* 14.0* 14.4*  HGB 8.8* 9.0* 8.4* 9.2* 9.3*  HCT 25.3* 25.3* 23.3* 26.1* 27.0*  MCV 98.1 96.2 97.5 96.7 97.8  PLT 326 323 275 282 286   Cardiac Enzymes: No results for input(s): CKTOTAL, CKMB, CKMBINDEX, TROPONINI in the last 168 hours. BNP (last 3 results) No results for input(s): BNP in the  last 8760 hours.  ProBNP (last 3 results) No results for input(s): PROBNP in the last 8760 hours.  CBG:  Recent Labs Lab 09/16/15 1720 09/16/15 2138 09/16/15 2139  GLUCAP 97 112* 128*    Recent Results (from the past 240 hour(s))  Blood Culture (routine x 2)     Status: None (Preliminary result)   Collection Time: 09/16/15  1:10 PM  Result Value Ref Range Status   Specimen Description BLOOD LEFT ARM  Final   Special Requests BOTTLES DRAWN AEROBIC ONLY 5CC  Final   Culture NO GROWTH 4 DAYS  Final   Report Status PENDING  Incomplete  Blood Culture (routine x 2)     Status: None (Preliminary result)   Collection Time: 09/16/15  1:25 PM  Result Value Ref Range Status   Specimen Description BLOOD RIGHT ARM  Final   Special Requests BOTTLES DRAWN AEROBIC ONLY 3CC  Final   Culture NO GROWTH 4 DAYS  Final   Report Status PENDING  Incomplete  Urine culture     Status: None   Collection Time: 09/16/15  1:50 PM  Result Value Ref Range Status   Specimen Description URINE, RANDOM  Final   Special Requests NONE  Final   Culture NO GROWTH 1 DAY  Final   Report Status 09/17/2015 FINAL  Final  Anaerobic culture     Status: None (Preliminary result)   Collection Time: 09/16/15  6:28 PM  Result Value Ref Range Status    Specimen Description ABSCESS RIGHT BUTTOCKS  Final   Special Requests PATIENT ON FOLLOWING VANC  Final   Gram Stain   Final    ABUNDANT WBC PRESENT,BOTH PMN AND MONONUCLEAR NO SQUAMOUS EPITHELIAL CELLS SEEN RARE GRAM POSITIVE COCCI IN PAIRS Performed at [facility]    Culture   Final    NO ANAEROBES ISOLATED; CULTURE IN PROGRESS FOR 5 DAYS Performed at [facility]    Report Status PENDING  Incomplete  Fungus Culture with Smear     Status: None (Preliminary result)   Collection Time: 09/16/15  6:28 PM  Result Value Ref Range Status   Specimen Description ABSCESS RIGHT BUTTOCKS  Final   Special Requests PATIENT ON FOLLOWING VANC  Final   Fungal Smear   Final    NO YEAST OR FUNGAL ELEMENTS SEEN Performed at [facility]    Culture   Final    CULTURE IN PROGRESS FOR FOUR WEEKS Performed at [facility]    Report Status PENDING  Incomplete  AFB culture with smear     Status: None (Preliminary result)   Collection Time: 09/16/15  6:28 PM  Result Value Ref Range Status   Specimen Description ABSCESS RIGHT BUTTOCKS  Final   Special Requests PATIENT ON FOLLOWING VANC  Final   Acid Fast Smear   Final    NO ACID FAST BACILLI SEEN Performed at [facility]    Culture   Final    CULTURE WILL BE EXAMINED FOR 6 WEEKS BEFORE ISSUING A FINAL REPORT Performed at [facility]    Report Status PENDING  Incomplete  Culture, routine-abscess     Status: None   Collection Time: 09/16/15  6:28 PM  Result Value Ref Range Status   Specimen Description ABSCESS RIGHT BUTTOCKS  Final   Special Requests PATIENT ON FOLLOWING VANC  Final   Gram Stain   Final    ABUNDANT WBC PRESENT,BOTH PMN AND MONONUCLEAR NO SQUAMOUS EPITHELIAL CELLS SEEN RARE Romie Minus  POSITIVE COCCI IN PAIRS Performed at [facility]    Culture   Final    FEW METHICILLIN RESISTANT STAPHYLOCOCCUS AUREUS Note: RIFAMPIN AND GENTAMICIN SHOULD NOT BE USED AS SINGLE DRUGS  FOR TREATMENT OF STAPH INFECTIONS. CRITICAL RESULT CALLED TO, READ BACK BY AND VERIFIED WITH: TAMMY P. AT 9:30AM ON 09/19/2015 HAJAM Performed at [facility]    Report Status 09/19/2015 FINAL  Final   Organism ID, Bacteria METHICILLIN RESISTANT STAPHYLOCOCCUS AUREUS  Final      Susceptibility   Methicillin resistant staphylococcus aureus - MIC*    CLINDAMYCIN <=0.25 SENSITIVE Sensitive     ERYTHROMYCIN 0.5 SENSITIVE Sensitive     GENTAMICIN <=0.5 SENSITIVE Sensitive     LEVOFLOXACIN 4 INTERMEDIATE Intermediate     OXACILLIN >=4 RESISTANT Resistant     RIFAMPIN <=0.5 SENSITIVE Sensitive     TRIMETH/SULFA <=10 SENSITIVE Sensitive     VANCOMYCIN <=0.5 SENSITIVE Sensitive     TETRACYCLINE <=1 SENSITIVE Sensitive     * FEW METHICILLIN RESISTANT STAPHYLOCOCCUS AUREUS  Surgical pcr screen     Status: Abnormal   Collection Time: 09/16/15 10:39 PM  Result Value Ref Range Status   MRSA, PCR POSITIVE (A) NEGATIVE Final   Staphylococcus aureus POSITIVE (A) NEGATIVE Final    Comment:        The Xpert SA Assay (FDA approved for NASAL specimens in patients over 73 years of age), is one component of a comprehensive surveillance program.  Test performance has been validated by [facility] for patients greater than or equal to 49 year old. It is not intended to diagnose infection nor to guide or monitor treatment.   C difficile quick scan w PCR reflex     Status: Abnormal   Collection Time: 09/20/15 12:02 AM  Result Value Ref Range Status   C Diff antigen POSITIVE (A) NEGATIVE Final   C Diff toxin NEGATIVE NEGATIVE Final   C Diff interpretation   Final    C. difficile present, but toxin not detected. This indicates colonization. In most cases, this does not require treatment. If patient has signs and symptoms consistent with colitis, consider treatment. Requires ENTERIC precautions.     Studies: No results found.  Scheduled Meds:  Scheduled Meds: . allopurinol  100 mg Oral  Daily  . amLODipine  5 mg Oral Daily  . antiseptic oral rinse  7 mL Mouth Rinse BID  . atorvastatin  20 mg Oral q1800  . Chlorhexidine Gluconate Cloth  6 each Topical Q0600  . clonazePAM  1 mg Oral QHS  . feeding supplement (ENSURE ENLIVE)  237 mL Oral QID  . heparin  5,000 Units Subcutaneous 3 times per day  . levothyroxine  88 mcg Oral QAC breakfast  . mupirocin ointment  1 application Nasal BID  . pantoprazole  40 mg Oral Q1200  . polyethylene glycol  17 g Oral Daily  . sodium chloride  10-40 mL Intracatheter Q12H  . sodium chloride  3 mL Intravenous Q12H  . vancomycin  1,000 mg Intravenous Q24H   Continuous Infusions: . sodium chloride 125 mL/hr at 09/19/15 1808    Time spent on care of this patient: 35 min   Fowler Antos, MD 09/20/2015, 3:19 PM  LOS: 4 days   [facility] Office  [phone] Pager - Text Page per [email or web address] If 7PM-7AM, please contact night-coverage [email or web address]

## 2015-09-20 NOTE — Progress Notes (Signed)
Patient ID: Jillian Wheeler, female   DOB: Nov 17, 1925, 81 y.o.   MRN: [id number]     [facility]      [address] [place], [place] [address]    Phone: [phone] FAX: [phone]     Subjective: Having diarrhea.  Afebrile.   Objective:  Vital signs:  Filed Vitals:   09/19/15 1729 09/19/15 2115 09/20/15 0607 09/20/15 0826  BP: 135/76 123/56 147/54 139/64  Pulse: 90 77 65 65  Temp: 97.4 F (36.3 C) 97.5 F (36.4 C) 97.9 F (36.6 C) 98 F (36.7 C)  TempSrc: Oral Oral Oral Oral  Resp: _0 Height:      Weight:      SpO2: 96% 99% 99% 100%    Last BM Date: 09/19/15  Intake/Output   Yesterday:  10/02 0701 - 10/03 0700 In: 2860.8 [P.O.:1060; I.V.:1520.8; IV Piggyback:200] Out: 510 [Urine:500; Stool:10] This shift:    I/O last 3 completed shifts: In: 4235.8 [P.O.:1060; I.V.:2895.8; Other:80; IV Piggyback:200] Out: 960 [Urine:950; Stool:10]   Physical Exam: General: Pt awake/alert/oriented x4 in no acute distress  Skin: penrose in place, no surrounding erythema or induration.    Problem List:   Principal Problem:   Abscess, gluteal, right Active Problems:   Hyperlipidemia   OSA (obstructive sleep apnea)   DIASTOLIC HEART FAILURE, CHRONIC   GERD   Anemia   Dehydration with hyponatremia   Sepsis affecting skin   Acute renal failure (HCC)   Constipation   Malnutrition of moderate degree ([place])    Results:   Labs: Results for orders placed or performed during the hospital encounter of 09/16/15 (from the past 48 hour(s))  TSH     Status: None   Collection Time: 09/18/15  5:35 PM  Result Value Ref Range   TSH 2.604 0.350 - 4.500 uIU/mL  T4, free     Status: None   Collection Time: 09/18/15  5:35 PM  Result Value Ref Range   Free T4 1.06 0.61 - 1.12 ng/dL  Lactic acid, plasma     Status: None   Collection Time: 09/18/15  5:35 PM  Result Value Ref Range   Lactic Acid, Venous 1.3 0.5 - 2.0 mmol/L   Basic metabolic panel     Status: Abnormal   Collection Time: 09/19/15  2:30 AM  Result Value Ref Range   Sodium 129 (L) 135 - 145 mmol/L   Potassium 3.3 (L) 3.5 - 5.1 mmol/L    Comment: DELTA CHECK NOTED   Chloride 99 (L) 101 - 111 mmol/L   CO2 22 22 - 32 mmol/L   Glucose, Bld 100 (H) 65 - 99 mg/dL   BUN 14 6 - 20 mg/dL   Creatinine, Ser 0.83 0.44 - 1.00 mg/dL   Calcium 7.5 (L) 8.9 - 10.3 mg/dL   GFR calc non Af Amer >60 >60 mL/min   GFR calc Af Amer >60 >60 mL/min    Comment: (NOTE) The eGFR has been calculated using the CKD EPI equation. This calculation has not been validated in all clinical situations. eGFR's persistently <60 mL/min signify possible Chronic Kidney Disease.    Anion gap 8 5 - 15  CBC     Status: Abnormal   Collection Time: 09/19/15  2:30 AM  Result Value Ref Range   WBC 13.3 (H) 4.0 - 10.5 K/uL   RBC 2.39 (L) 3.87 - 5.11 MIL/uL   Hemoglobin 8.4 (L) 12.0 - 15.0 g/dL  HCT 23.3 (L) 36.0 - 46.0 %   MCV 97.5 78.0 - 100.0 fL   MCH 35.1 (H) 26.0 - 34.0 pg   MCHC 36.1 (H) 30.0 - 36.0 g/dL   RDW 13.7 11.5 - 15.5 %   Platelets 275 150 - 400 K/uL  Basic metabolic panel     Status: Abnormal   Collection Time: 09/19/15  7:50 AM  Result Value Ref Range   Sodium 132 (L) 135 - 145 mmol/L   Potassium 3.1 (L) 3.5 - 5.1 mmol/L   Chloride 104 101 - 111 mmol/L   CO2 21 (L) 22 - 32 mmol/L   Glucose, Bld 87 65 - 99 mg/dL   BUN 13 6 - 20 mg/dL   Creatinine, Ser 0.73 0.44 - 1.00 mg/dL   Calcium 7.8 (L) 8.9 - 10.3 mg/dL   GFR calc non Af Amer >60 >60 mL/min   GFR calc Af Amer >60 >60 mL/min    Comment: (NOTE) The eGFR has been calculated using the CKD EPI equation. This calculation has not been validated in all clinical situations. eGFR's persistently <60 mL/min signify possible Chronic Kidney Disease.    Anion gap 7 5 - 15  CBC     Status: Abnormal   Collection Time: 09/19/15  7:50 AM  Result Value Ref Range   WBC 14.0 (H) 4.0 - 10.5 K/uL   RBC 2.70 (L) 3.87 -  5.11 MIL/uL   Hemoglobin 9.2 (L) 12.0 - 15.0 g/dL   HCT 26.1 (L) 36.0 - 46.0 %   MCV 96.7 78.0 - 100.0 fL   MCH 34.1 (H) 26.0 - 34.0 pg   MCHC 35.2 30.0 - 36.0 g/dL   RDW 13.6 11.5 - 15.5 %   Platelets 282 150 - 400 K/uL  Vancomycin, trough     Status: None   Collection Time: 09/19/15  1:05 PM  Result Value Ref Range   Vancomycin Tr 19 10.0 - 20.0 ug/mL  C difficile quick scan w PCR reflex     Status: Abnormal   Collection Time: 09/20/15 12:02 AM  Result Value Ref Range   C Diff antigen POSITIVE (A) NEGATIVE   C Diff toxin NEGATIVE NEGATIVE   C Diff interpretation      C. difficile present, but toxin not detected. This indicates colonization. In most cases, this does not require treatment. If patient has signs and symptoms consistent with colitis, consider treatment. Requires ENTERIC precautions.  Procalcitonin     Status: None   Collection Time: 09/20/15  4:17 AM  Result Value Ref Range   Procalcitonin 0.12 ng/mL    Comment:        Interpretation: PCT (Procalcitonin) <= 0.5 ng/mL: Systemic infection (sepsis) is not likely. Local bacterial infection is possible. (NOTE)         ICU PCT Algorithm               Non ICU PCT Algorithm    ----------------------------     ------------------------------         PCT < 0.25 ng/mL                 PCT < 0.1 ng/mL     Stopping of antibiotics            Stopping of antibiotics       strongly encouraged.               strongly encouraged.    ----------------------------     ------------------------------  PCT level decrease by               PCT < 0.25 ng/mL       >= 80% from peak PCT       OR PCT 0.25 - 0.5 ng/mL          Stopping of antibiotics                                             encouraged.     Stopping of antibiotics           encouraged.    ----------------------------     ------------------------------       PCT level decrease by              PCT >= 0.25 ng/mL       < 80% from peak PCT        AND PCT >= 0.5 ng/mL             Continuin g antibiotics                                              encouraged.       Continuing antibiotics            encouraged.    ----------------------------     ------------------------------     PCT level increase compared          PCT > 0.5 ng/mL         with peak PCT AND          PCT >= 0.5 ng/mL             Escalation of antibiotics                                          strongly encouraged.      Escalation of antibiotics        strongly encouraged.   Basic metabolic panel     Status: Abnormal   Collection Time: 09/20/15  4:17 AM  Result Value Ref Range   Sodium 130 (L) 135 - 145 mmol/L   Potassium 3.7 3.5 - 5.1 mmol/L   Chloride 104 101 - 111 mmol/L   CO2 21 (L) 22 - 32 mmol/L   Glucose, Bld 88 65 - 99 mg/dL   BUN 15 6 - 20 mg/dL   Creatinine, Ser 0.79 0.44 - 1.00 mg/dL   Calcium 7.5 (L) 8.9 - 10.3 mg/dL   GFR calc non Af Amer >60 >60 mL/min   GFR calc Af Amer >60 >60 mL/min    Comment: (NOTE) The eGFR has been calculated using the CKD EPI equation. This calculation has not been validated in all clinical situations. eGFR's persistently <60 mL/min signify possible Chronic Kidney Disease.    Anion gap 5 5 - 15  CBC     Status: Abnormal   Collection Time: 09/20/15  4:17 AM  Result Value Ref Range   WBC 14.4 (H) 4.0 - 10.5 K/uL   RBC 2.76 (L) 3.87 - 5.11 MIL/uL   Hemoglobin 9.3 (L) 12.0 - 15.0 g/dL   HCT 27.0 (L) 36.0 -  46.0 %   MCV 97.8 78.0 - 100.0 fL   MCH 33.7 26.0 - 34.0 pg   MCHC 34.4 30.0 - 36.0 g/dL   RDW 14.2 11.5 - 15.5 %   Platelets 286 150 - 400 K/uL    Imaging / Studies: No results found.  Medications / Allergies:  Scheduled Meds: . allopurinol  100 mg Oral Daily  . amLODipine  5 mg Oral Daily  . antiseptic oral rinse  7 mL Mouth Rinse BID  . atorvastatin  20 mg Oral q1800  . Chlorhexidine Gluconate Cloth  6 each Topical Q0600  . clonazePAM  1 mg Oral QHS  . feeding supplement (ENSURE ENLIVE)  237 mL Oral QID  . heparin  5,000  Units Subcutaneous 3 times per day  . levothyroxine  88 mcg Oral QAC breakfast  . mupirocin ointment  1 application Nasal BID  . pantoprazole  40 mg Oral Q1200  . polyethylene glycol  17 g Oral Daily  . sodium chloride  10-40 mL Intracatheter Q12H  . sodium chloride  3 mL Intravenous Q12H  . vancomycin  1,000 mg Intravenous Q24H   Continuous Infusions: . sodium chloride 125 mL/hr at 09/19/15 1808   PRN Meds:.acetaminophen **OR** acetaminophen, albuterol, fluticasone, HYDROcodone-acetaminophen, iohexol, morphine injection, MUSCLE RUB, sodium chloride  Antibiotics: Anti-infectives    Start     Dose/Rate Route Frequency Ordered Stop   09/17/15 1400  vancomycin (VANCOCIN) IVPB 1000 mg/200 mL premix     1,000 mg 200 mL/hr over 60 Minutes Intravenous Every 24 hours 09/16/15 1325     09/16/15 2200  piperacillin-tazobactam (ZOSYN) IVPB 3.375 g  Status:  Discontinued     3.375 g 12.5 mL/hr over 240 Minutes Intravenous 3 times per day 09/16/15 1325 09/18/15 1455   09/16/15 1300  piperacillin-tazobactam (ZOSYN) IVPB 3.375 g     3.375 g 100 mL/hr over 30 Minutes Intravenous  Once 09/16/15 1252 09/16/15 1359   09/16/15 1300  vancomycin (VANCOCIN) IVPB 1000 mg/200 mL premix     1,000 mg 200 mL/hr over 60 Minutes Intravenous  Once 09/16/15 1252 09/16/15 1430        Assessment/Plan Right buttock abscess, sepsisPOD#4 I&D of right buttock wound---Dr. Redmond Pulling  -leave penrose in place, not sure if hydrotherapy can be performed as the opening is fairly small.  No surrounding erythema or drainage.  Pt having diarrhea, need to cleanse wound/sitz baths ID- wound culture +MRSA, on Vanc.  +c diff.  From a surgical standpoint, a total of 7 days of therapy for the wound.   FEN-no issues VTE prophylaxis-scd/heparin  Erby Pian, [facility] Pager [phone]) For consults and floor pages call [phone])  09/20/2015  8:53 AM

## 2015-09-21 DIAGNOSIS — E440 Moderate protein-calorie malnutrition: Secondary | ICD-10-CM

## 2015-09-21 LAB — BASIC METABOLIC PANEL
ANION GAP: 3 — AB (ref 5–15)
BUN: 12 mg/dL (ref 6–20)
CO2: 20 mmol/L — ABNORMAL LOW (ref 22–32)
Calcium: 7.6 mg/dL — ABNORMAL LOW (ref 8.9–10.3)
Chloride: 107 mmol/L (ref 101–111)
Creatinine, Ser: 0.66 mg/dL (ref 0.44–1.00)
Glucose, Bld: 86 mg/dL (ref 65–99)
POTASSIUM: 3.5 mmol/L (ref 3.5–5.1)
SODIUM: 130 mmol/L — AB (ref 135–145)

## 2015-09-21 LAB — CULTURE, BLOOD (ROUTINE X 2)
Culture: NO GROWTH
Culture: NO GROWTH

## 2015-09-21 LAB — CBC
HEMATOCRIT: 26 % — AB (ref 36.0–46.0)
Hemoglobin: 9.1 g/dL — ABNORMAL LOW (ref 12.0–15.0)
MCH: 34.2 pg — ABNORMAL HIGH (ref 26.0–34.0)
MCHC: 35 g/dL (ref 30.0–36.0)
MCV: 97.7 fL (ref 78.0–100.0)
Platelets: 287 10*3/uL (ref 150–400)
RBC: 2.66 MIL/uL — AB (ref 3.87–5.11)
RDW: 13.9 % (ref 11.5–15.5)
WBC: 14.4 10*3/uL — AB (ref 4.0–10.5)

## 2015-09-21 MED ORDER — GERHARDT'S BUTT CREAM
TOPICAL_CREAM | Freq: Three times a day (TID) | CUTANEOUS | Status: DC
  Administered 2015-09-21: 11:00:00 via TOPICAL
  Filled 2015-09-21: qty 1

## 2015-09-21 MED ORDER — DOXYCYCLINE HYCLATE 50 MG PO CAPS: 100.0000 mg | ORAL_CAPSULE | Freq: Two times a day (BID) | ORAL | Status: DC

## 2015-09-21 MED ORDER — HYDROCODONE-ACETAMINOPHEN 5-325 MG PO TABS: 1.0000 | ORAL_TABLET | Freq: Four times a day (QID) | ORAL | Status: AC | PRN

## 2015-09-21 MED ORDER — CLONAZEPAM 1 MG PO TABS: 1.0000 mg | ORAL_TABLET | Freq: Every day | ORAL | Status: AC

## 2015-09-21 MED ORDER — GERHARDT'S BUTT CREAM: 1.0000 "application " | TOPICAL_CREAM | Freq: Three times a day (TID) | CUTANEOUS | Status: AC

## 2015-09-21 NOTE — Progress Notes (Signed)
Patient Discharge: Disposition: patient discharged to [facility]. Given report to nurse Sherry Ruffing) and answered all her questions.  IV: Discontinued IV before discharge. Telemetry: N/A  Transportation: Patient transported via EMS. Belongings: Patient took all her belongings with her.

## 2015-09-21 NOTE — Discharge Summary (Signed)
PATIENT DETAILS Name: Jillian Wheeler Age: 81 y.o. Sex: female Date of Birth: 1925-03-15 MRN: [id number]. Admitting Physician: Richarda Overlie, MD WUJ:WJXB,JYNWGNFAOZ R, MD  Admit Date: 09/16/2015 Discharge date: 09/21/2015  Recommendations for Outpatient Follow-up:  1. Please repeat CBC/BMET in 1 week 2. Please ensure follow-up with general surgery   PRIMARY DISCHARGE DIAGNOSIS:  Principal Problem:   Abscess, gluteal, right Active Problems:   Hyperlipidemia   OSA (obstructive sleep apnea)   DIASTOLIC HEART FAILURE, CHRONIC   GERD   Anemia   Dehydration with hyponatremia   Sepsis affecting skin   Acute renal failure (HCC)   Constipation   Malnutrition of moderate degree (HCC)      PAST MEDICAL HISTORY: Past Medical History  Diagnosis Date  . Palpitations   . Unspecified fall   . Altered mental status   . Other nonspecific finding on examination of urine   . Symptomatic menopausal or female climacteric states   . Insomnia, unspecified   . Contact dermatitis and other eczema, due to unspecified cause   . Type II or unspecified type diabetes mellitus with neurological manifestations, not stated as uncontrolled   . Dermatophytosis of nail   . Mastodynia   . Contact dermatitis and other eczema due to solvents   . Chronic kidney disease, stage III (moderate)   . Acute bronchitis   . Wheezing   . Pain in joint, pelvic region and thigh   . Other speech disturbance(784.59)   . Unspecified acute reaction to stress   . Other malaise and fatigue   . Spasm of muscle   . Blood in stool   . Other specified noninflammatory disorder of vagina   . Osteoarthrosis, unspecified whether generalized or localized, unspecified site   . Gout, unspecified   . Adjustment disorder with mixed anxiety and depressed mood   . Other vitamin B12 deficiency anemia   . Obstructive sleep apnea (adult) (pediatric)   . Other dyspnea and respiratory abnormality   . Type II or unspecified type  diabetes mellitus without mention of complication, not stated as uncontrolled   . Unspecified asthma(493.90)   . Esophageal reflux   . Other and unspecified hyperlipidemia   . Thyrotoxicosis without mention of goiter or other cause, without mention of thyrotoxic crisis or storm   . Unspecified essential hypertension   . 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 SIADH (syndrome of inappropriate ADH production) 05/09/2015    DISCHARGE MEDICATIONS: Current Discharge Medication List    START taking these medications   Details  doxycycline (VIBRAMYCIN) 50 MG capsule Take 2 capsules (100 mg total) by mouth 2 (two) times daily. For 3 more days from 10/4 and then discotinue    HYDROcodone-acetaminophen (NORCO/VICODIN) 5-325 MG tablet Take 1 tablet by mouth every 6 (six) hours as needed for moderate pain. Qty: 20 tablet, Refills: 0    Hydrocortisone (GERHARDT'S BUTT CREAM) CREA Apply 1 application topically 3 (three) times daily.      CONTINUE these medications which have CHANGED   Details  clonazePAM (KLONOPIN) 1 MG tablet Take 1 tablet (1 mg total) by mouth at bedtime. Qty: 15 tablet, Refills: 0      CONTINUE these medications which have NOT CHANGED   Details  acetaminophen (TYLENOL) 325 MG tablet Take 325 mg by mouth at bedtime. To be taken with Tramadol    albuterol (PROVENTIL) (2.5 MG/3ML) 0.083% nebulizer solution Take 3 mLs (2.5 mg total) by nebulization every 2 (two) hours as needed for wheezing. Qty: 75 mL, Refills:  12    allopurinol (ZYLOPRIM) 100 MG tablet Take 100 mg by mouth daily.     amLODipine (NORVASC) 5 MG tablet Take 1 tablet (5 mg total) by mouth daily. Qty: 30 tablet, Refills: 1    atorvastatin (LIPITOR) 20 MG tablet Take 20 mg by mouth daily.     Calcium Carbonate-Vitamin D (CALTRATE 600+D) 600-400 MG-UNIT per tablet Take 2 tablets by mouth daily. At bedtime    cyanocobalamin (,VITAMIN B-12,) 1000 MCG/ML injection Inject 1,000 mcg into the muscle every 30 (thirty) days. On  28th    fluticasone (FLONASE) 50 MCG/ACT nasal spray Place 2 sprays into both nostrils daily as needed for allergies or rhinitis.     levothyroxine (SYNTHROID, LEVOTHROID) 88 MCG tablet Take 88 mcg by mouth daily before breakfast.    Melatonin 5 MG CAPS Take 5 mg by mouth at bedtime.     Menthol, Topical Analgesic, (BIOFREEZE) 4 % GEL Apply topically at bedtime.    mirtazapine (REMERON) 15 MG tablet Take 15 mg by mouth at bedtime.    Multiple Vitamins-Minerals (PRESERVISION AREDS 2) CAPS Take 2 capsules by mouth daily at 12 noon.    Omega-3-acid Ethyl Esters (LOVAZA PO) Take 1,200 mg by mouth daily.    pantoprazole (PROTONIX) 40 MG tablet Take 40 mg by mouth daily.    Probiotic Product (PHILLIPS COLON HEALTH PO) Take 1 capsule by mouth daily as needed (constipation).    triamcinolone cream (KENALOG) 0.1 % Apply 1 application topically 2 (two) times daily as needed (irritation).      STOP taking these medications     bisacodyl (DULCOLAX) 10 MG suppository      bisacodyl (DULCOLAX) 5 MG EC tablet      ciprofloxacin (CIPRO) 500 MG tablet      Multiple Vitamins-Minerals (MULTIVITAMIN ADULTS 50+ PO)      traMADol (ULTRAM) 50 MG tablet      sodium chloride 1 G tablet      traMADol-acetaminophen (ULTRACET) 37.5-325 MG per tablet         ALLERGIES:   Allergies  Allergen Reactions  . Aspirin Other (See Comments)    Upset stomach  . Celecoxib Other (See Comments)    Upset stomach  . Diclofenac Sodium Other (See Comments)    Pt does not remember   . Ibuprofen Other (See Comments)    Upset stomach   . Naproxen Other (See Comments)    Upset stomach   . Sulindac Other (See Comments)    Pt does not remember     BRIEF HPI:  See H&P, Labs, Consult and Test reports for all details in brief, patient was admitted for evaluation of leukocytosis and lower abdominal pain, further evaluation revealed a right gluteal abscess. Patient was then admitted for further evaluation  and treatment  CONSULTATIONS:   general surgery  PERTINENT RADIOLOGIC STUDIES: Ct Abdomen Pelvis Wo Contrast  09/16/2015   CLINICAL DATA:  Increasing weakness over the past 14 days. Intermittent lower abdominal pain since February, 2016. Constipation. Urinary tract infection. Elevated white blood cell count. Subsequent encounter.  EXAM: CT ABDOMEN AND PELVIS WITHOUT CONTRAST  TECHNIQUE: Multidetector CT imaging of the abdomen and pelvis was performed following the standard protocol without IV contrast.  COMPARISON:  CT abdomen and pelvis 05/06/2015 and 12/23/2014.  FINDINGS: Hiatal hernia is noted. No pleural or pericardial effusion. Heart size is upper normal. Dependent atelectasis is seen lung bases.  A few small stones are seen in the gallbladder. The liver, spleen, adrenal glands  and pancreas are unremarkable. There is cortical atrophy and kidneys. Small cyst in the upper pole of the left kidney is again seen, unchanged.  There a new area of increased attenuation in subcutaneous fat along the right cleft of the buttock. Area of involvement measures 4.4 cm transverse by 6.0 cm AP by approximately 6.5 cm craniocaudal. Gas in subcutaneous fat is seen the left buttock possibly from prior injection.  The patient is status post hysterectomy. Patient's sigmoid diverticulosis in an otherwise unremarkable: The patient appears to be status post appendectomy. The small bowel is unremarkable. No lymphadenopathy, free air or intra-abdominal focal fluid collection is identified. Fat containing umbilical hernia is unchanged.  Bones demonstrate a fracture of L5 where the patient is status post vertebral augmentation. Methylmethacrylate extends into the right lateral recess and foramen and the L4-5 disc interspace, unchanged. No acute fracture is seen. The patient has convex left lumbar scoliosis and multilevel spondylosis.  IMPRESSION: Infiltration of fat along the right cleft of the buttock is nonspecific but could be  due to contusion or phlegmon and cellulitis. This area should be amenable to direct visual inspection.  No acute finding in the abdomen or pelvis.  Gallstones without evidence of cholecystitis.  Hiatal hernia.  Atherosclerosis.  Lumbar spondylosis. Status post L5 vertebral augmentation, unchanged in appearance.   Electronically Signed   By: Drusilla Kanner M.D.   On: 09/16/2015 14:38   Dg Chest Port 1 View  09/16/2015   CLINICAL DATA:  Sepsis.  Weakness.  Initial encounter.  EXAM: PORTABLE CHEST 1 VIEW  COMPARISON:  03/03/2015.  FINDINGS: Cardiopericardial silhouette within normal limits. Mediastinal contours normal. Trachea midline. No airspace disease or effusion. Lung volumes are slightly low. Monitoring leads project over the chest.  IMPRESSION: No active disease.   Electronically Signed   By: Andreas Newport M.D.   On: 09/16/2015 13:33     PERTINENT LAB RESULTS: CBC:  Recent Labs  09/20/15 0417 09/21/15 0722  WBC 14.4* 14.4*  HGB 9.3* 9.1*  HCT 27.0* 26.0*  PLT 286 287   CMET CMP     Component Value Date/Time   NA 130* 09/21/2015 0722   K 3.5 09/21/2015 0722   CL 107 09/21/2015 0722   CO2 20* 09/21/2015 0722   GLUCOSE 86 09/21/2015 0722   BUN 12 09/21/2015 0722   CREATININE 0.66 09/21/2015 0722   CALCIUM 7.6* 09/21/2015 0722   PROT 4.2* 09/17/2015 0756   ALBUMIN 1.4* 09/17/2015 0756   AST 32 09/17/2015 0756   ALT 28 09/17/2015 0756   ALKPHOS 132* 09/17/2015 0756   BILITOT 0.9 09/17/2015 0756   GFRNONAA >60 09/21/2015 0722   GFRAA >60 09/21/2015 0722    GFR Estimated Creatinine Clearance: 42 mL/min (by C-G formula based on Cr of 0.66). No results for input(s): LIPASE, AMYLASE in the last 72 hours. No results for input(s): CKTOTAL, CKMB, CKMBINDEX, TROPONINI in the last 72 hours. Invalid input(s): POCBNP No results for input(s): DDIMER in the last 72 hours. No results for input(s): HGBA1C in the last 72 hours. No results for input(s): CHOL, HDL, LDLCALC, TRIG,  CHOLHDL, LDLDIRECT in the last 72 hours.  Recent Labs  09/18/15 1735  TSH 2.604   No results for input(s): VITAMINB12, FOLATE, FERRITIN, TIBC, IRON, RETICCTPCT in the last 72 hours. Coags: No results for input(s): INR in the last 72 hours.  Invalid input(s): PT Microbiology: Recent Results (from the past 240 hour(s))  Blood Culture (routine x 2)     Status: None (  Preliminary result)   Collection Time: 09/16/15  1:10 PM  Result Value Ref Range Status   Specimen Description BLOOD LEFT ARM  Final   Special Requests BOTTLES DRAWN AEROBIC ONLY 5CC  Final   Culture NO GROWTH 4 DAYS  Final   Report Status PENDING  Incomplete  Blood Culture (routine x 2)     Status: None (Preliminary result)   Collection Time: 09/16/15  1:25 PM  Result Value Ref Range Status   Specimen Description BLOOD RIGHT ARM  Final   Special Requests BOTTLES DRAWN AEROBIC ONLY 3CC  Final   Culture NO GROWTH 4 DAYS  Final   Report Status PENDING  Incomplete  Urine culture     Status: None   Collection Time: 09/16/15  1:50 PM  Result Value Ref Range Status   Specimen Description URINE, RANDOM  Final   Special Requests NONE  Final   Culture NO GROWTH 1 DAY  Final   Report Status 09/17/2015 FINAL  Final  Anaerobic culture     Status: None (Preliminary result)   Collection Time: 09/16/15  6:28 PM  Result Value Ref Range Status   Specimen Description ABSCESS RIGHT BUTTOCKS  Final   Special Requests PATIENT ON FOLLOWING VANC  Final   Gram Stain   Final    ABUNDANT WBC PRESENT,BOTH PMN AND MONONUCLEAR NO SQUAMOUS EPITHELIAL CELLS SEEN RARE GRAM POSITIVE COCCI IN PAIRS Performed at [facility]    Culture   Final    NO ANAEROBES ISOLATED; CULTURE IN PROGRESS FOR 5 DAYS Performed at [facility]    Report Status PENDING  Incomplete  Fungus Culture with Smear     Status: None (Preliminary result)   Collection Time: 09/16/15  6:28 PM  Result Value Ref Range Status   Specimen Description ABSCESS  RIGHT BUTTOCKS  Final   Special Requests PATIENT ON FOLLOWING VANC  Final   Fungal Smear   Final    NO YEAST OR FUNGAL ELEMENTS SEEN Performed at [facility]    Culture   Final    CULTURE IN PROGRESS FOR FOUR WEEKS Performed at [facility]    Report Status PENDING  Incomplete  AFB culture with smear     Status: None (Preliminary result)   Collection Time: 09/16/15  6:28 PM  Result Value Ref Range Status   Specimen Description ABSCESS RIGHT BUTTOCKS  Final   Special Requests PATIENT ON FOLLOWING VANC  Final   Acid Fast Smear   Final    NO ACID FAST BACILLI SEEN Performed at [facility]    Culture   Final    CULTURE WILL BE EXAMINED FOR 6 WEEKS BEFORE ISSUING A FINAL REPORT Performed at [facility]    Report Status PENDING  Incomplete  Culture, routine-abscess     Status: None   Collection Time: 09/16/15  6:28 PM  Result Value Ref Range Status   Specimen Description ABSCESS RIGHT BUTTOCKS  Final   Special Requests PATIENT ON FOLLOWING VANC  Final   Gram Stain   Final    ABUNDANT WBC PRESENT,BOTH PMN AND MONONUCLEAR NO SQUAMOUS EPITHELIAL CELLS SEEN RARE GRAM POSITIVE COCCI IN PAIRS Performed at [facility]    Culture   Final    FEW METHICILLIN RESISTANT STAPHYLOCOCCUS AUREUS Note: RIFAMPIN AND GENTAMICIN SHOULD NOT BE USED AS SINGLE DRUGS FOR TREATMENT OF STAPH INFECTIONS. CRITICAL RESULT CALLED TO, READ BACK BY AND VERIFIED WITH: TAMMY P. AT 9:30AM ON 09/19/2015 HAJAM  Performed at [facility]    Report Status 09/19/2015 FINAL  Final   Organism ID, Bacteria METHICILLIN RESISTANT STAPHYLOCOCCUS AUREUS  Final      Susceptibility   Methicillin resistant staphylococcus aureus - MIC*    CLINDAMYCIN <=0.25 SENSITIVE Sensitive     ERYTHROMYCIN 0.5 SENSITIVE Sensitive     GENTAMICIN <=0.5 SENSITIVE Sensitive     LEVOFLOXACIN 4 INTERMEDIATE Intermediate     OXACILLIN >=4 RESISTANT Resistant     RIFAMPIN <=0.5 SENSITIVE  Sensitive     TRIMETH/SULFA <=10 SENSITIVE Sensitive     VANCOMYCIN <=0.5 SENSITIVE Sensitive     TETRACYCLINE <=1 SENSITIVE Sensitive     * FEW METHICILLIN RESISTANT STAPHYLOCOCCUS AUREUS  Surgical pcr screen     Status: Abnormal   Collection Time: 09/16/15 10:39 PM  Result Value Ref Range Status   MRSA, PCR POSITIVE (A) NEGATIVE Final   Staphylococcus aureus POSITIVE (A) NEGATIVE Final    Comment:        The Xpert SA Assay (FDA approved for NASAL specimens in patients over 16 years of age), is one component of a comprehensive surveillance program.  Test performance has been validated by [facility] for patients greater than or equal to 109 year old. It is not intended to diagnose infection nor to guide or monitor treatment.   C difficile quick scan w PCR reflex     Status: Abnormal   Collection Time: 09/20/15 12:02 AM  Result Value Ref Range Status   C Diff antigen POSITIVE (A) NEGATIVE Final   C Diff toxin NEGATIVE NEGATIVE Final   C Diff interpretation   Final    C. difficile present, but toxin not detected. This indicates colonization. In most cases, this does not require treatment. If patient has signs and symptoms consistent with colitis, consider treatment. Requires ENTERIC precautions.     BRIEF HOSPITAL COURSE:   Principal Problem: Sepsis secondary to Abscess, gluteal, right: Much improved, sepsis pathophysiology has resolved. Initially with profound leukocytosis, hypothermia and monitored in the stepdown unit. Treated aggressively with broad-spectrum antibiotics and IV fluids. General surgery consulted, underwent incision and drainage on 9/29, cultures positive for MRSA. By day of discharge, leukocytosis downtrending, down to 14,000 (30.5 thousand on admission). Blood cultures 2 continue to be negative. Seen by his general surgery today, okay for discharge. Will transitioned to doxycycline for a few more days. Per general surgery, to leave Penrose drain in place, this  will be removed upon follow-up with general surgery.  Active Problems: Diarrhea: Some diarrhea on 10/3, per RN no diarrhea today. C. difficile antigen positive, but toxin negative-suspect this is a colonization rather than a true infection. She was monitored off antibiotics.  Hypothermia: Secondary to sepsis, resolved. TSH within normal limits.  Acute renal failure: Resolved.  Hyponatremia: Suspect chronic SIADH. Will resume fluid restriction 1500 mL a day. Please check electrolytes in one week.  Acute encephalopathy: Secondary sepsis, much improved. Appears to be awake and alert at the time of discharge. Answering all my questions appropriately.  Anemia: Likely secondary to acute on chronic illness. Hemoglobin stable at 9.1. Please recheck CBC in one week, and if needed further workup to be done as an outpatient  Chronic diastolic heart failure: Clinically compensated.  Essential hypertension: Initially antihypertensives held as the pressure was soft, once sepsis pathophysiology resolved-amlodipine resumed.  Hypokalemia: Repleted, normalized at time of discharge.  Malnutrition of moderate degree: Supplements discontinued because of diarrhea. Reassess in the outpatient setting  Gout: Continue allopurinol  Hypothyroidism:  Continue Synthroid.  History of chronic constipation: Given recent diarrhea-stop all laxatives and stool softeners for the next few days-resume when able.  TODAY-DAY OF DISCHARGE:  Subjective:   Skilynn Durney today has no headache,no chest abdominal pain,no new weakness tingling or numbness, feels much better  Objective:   Blood pressure 140/62, pulse 61, temperature 97.2 F (36.2 C), temperature source Oral, resp. rate 19, height 5\' 4"  (1.626 m), weight 61.689 kg (136 lb), SpO2 99 %.  Intake/Output Summary (Last 24 hours) at 09/21/15 0956 Last data filed at 09/21/15 0932  Gross per 24 hour  Intake    960 ml  Output    801 ml  Net    159 ml   Filed Weights    09/18/15 0413 09/19/15 0353 09/20/15 2025  Weight: 65.227 kg (143 lb 12.8 oz) 61.598 kg (135 lb 12.8 oz) 61.689 kg (136 lb)    Exam Awake Alert, Oriented *3, No new F.N deficits, Normal affect [place].AT,PERRAL Supple Neck,No JVD, No cervical lymphadenopathy appriciated.  Symmetrical Chest wall movement, Good air movement bilaterally, CTAB RRR,No Gallops,Rubs or new Murmurs, No Parasternal Heave +ve B.Sounds, Abd Soft, Non tender, No organomegaly appriciated, No rebound -guarding or rigidity. No Cyanosis, Clubbing or edema, No new Rash or bruise  DISCHARGE CONDITION: Stable  DISPOSITION: SNF  DISCHARGE INSTRUCTIONS:    Activity:  As tolerated with Full fall precautions use walker/cane & assistance as needed  PENROSE DRAIN INSTRUCTIONS---ON HER BUTTOCKS -Leave the drain in place.  If it is unintentionally removed, it's okay. -Watch for swelling, increased pain or increased redness/induration/fluctuance -she has contact dermatitis from diarrhea in this area, apply a barrier cream -may apply an abdominal pad if needed for drainage, otherwise leave open to air  Get Medicines reviewed and adjusted: Please take all your medications with you for your next visit with your Primary MD  Please request your Primary MD to go over all hospital tests and procedure/radiological results at the follow up, please ask your Primary MD to get all Hospital records sent to his/her office.  If you experience worsening of your admission symptoms, develop shortness of breath, life threatening emergency, suicidal or homicidal thoughts you must seek medical attention immediately by calling 911 or calling your MD immediately  if symptoms less severe.  You must read complete instructions/literature along with all the possible adverse reactions/side effects for all the Medicines you take and that have been prescribed to you. Take any new Medicines after you have completely understood and accpet all the possible  adverse reactions/side effects.   Do not drive when taking Pain medications.   Do not take more than prescribed Pain, Sleep and Anxiety Medications  Special Instructions: If you have smoked or chewed Tobacco  in the last 2 yrs please stop smoking, stop any regular Alcohol  and or any Recreational drug use.  Wear Seat belts while driving.  Please note  You were cared for by a hospitalist during your hospital stay. Once you are discharged, your primary care physician will handle any further medical issues. Please note that NO REFILLS for any discharge medications will be authorized once you are discharged, as it is imperative that you return to your primary care physician (or establish a relationship with a primary care physician if you do not have one) for your aftercare needs so that they can reassess your need for medications and monitor your lab values.   Diet recommendation: Heart Healthy diet-with 1500 cc fluid restriction  Discharge Instructions  Call MD for:  redness, tenderness, or signs of infection (pain, swelling, redness, odor or green/yellow discharge around incision site)    Complete by:  As directed      Diet - low sodium heart healthy    Complete by:  As directed   1500 cc fluid restriction     Increase activity slowly    Complete by:  As directed            Follow-up Information    Follow up with Atilano Ina, MD On 10/07/2015.   Specialty:  General Surgery   Why:  ARRIVE BY 10:15AM FOR A 1045AM POST OPERATIVE CHECK UP FOR BUTT ABSCESS AND DRAIN   Contact information:   [address] [place] [address] [phone]       Follow up with Hoyle Sauer, MD. Schedule an appointment as soon as possible for a visit in 2 weeks.   Specialty:  Internal Medicine   Contact information:   [address] [place] [address] [phone]       Total Time spent on discharge equals 45 minutes.  SignedJeoffrey Massed 09/21/2015 9:56  AM

## 2015-09-21 NOTE — Progress Notes (Signed)
Physical Therapy Treatment Patient Details Name: Jillian Wheeler MRN: [id number] DOB: 06-Apr-1925 Today's Date: 09/21/2015    History of Present Illness 81 year old female patient resident of assisted living facility with a history of recurrent hyponatremia, obstructive sleep apnea, chronic diastolic heart failure, dementia, significant hearing loss dyslipidemia, GERD, anemia, asthma and recurrent UTIs.. Pt admitted with sepsis from right gluteal abscess s/p I&D 9/29    PT Comments    Noting better participation with mobility, including able to stand with stable LEs; Declined amb today -- will plan to push for progressive ambulation next session;   I continue to favor SNF for dc plan; with current progress, I believe Jillian Wheeler with progress better with more consistent daily therapies at SNF (versus a few times a week with HHPT)   Follow Up Recommendations  SNF;Supervision/Assistance - 24 hour     Equipment Recommendations  None recommended by PT    Recommendations for Other Services       Precautions / Restrictions Precautions Precautions: Fall Precaution Comments: moderate dementia Restrictions Weight Bearing Restrictions: No    Mobility  Bed Mobility Overal bed mobility: Needs Assistance Bed Mobility: Supine to Sit     Supine to sit: Min assist     General bed mobility comments: Cues to initiate, and min assist to push up to sitting; Spontaneously shifted weight ot scoot and square off hips   Transfers Overall transfer level: Needs assistance Equipment used: 2 person hand held assist (and gait belt hold bilaterally) Transfers: Sit to/from Stand;Stand Pivot Transfers Sit to Stand: +2 safety/equipment;Mod assist Stand pivot transfers: +2 safety/equipment;+2 physical assistance;Mod assist       General transfer comment: Noted good push through LEs to acheive standing; a few pivot steps to chair with bilateral UE support  Ambulation/Gait             General  Gait Details: Declined amb   Stairs            Wheelchair Mobility    Modified Rankin (Stroke Patients Only)       Balance     Sitting balance-Jillian Wheeler Scale: Fair       Standing balance-Jillian Wheeler Scale: Poor                      Cognition Arousal/Alertness: Awake/alert Behavior During Therapy: WFL for tasks assessed/performed Overall Cognitive Status: No family/caregiver present to determine baseline cognitive functioning (Oriented to person, much less agitated today)       Memory: Decreased short-term memory              Exercises      General Comments General comments (skin integrity, edema, etc.): Made our best effort to position pillows/pad chair to increase sitting tolerance      Pertinent Vitals/Pain Pain Assessment: No/denies pain    Home Living                      Prior Function            PT Goals (current goals can now be found in the care plan section) Acute Rehab PT Goals Patient Stated Goal: Agreeable to OOB PT Goal Formulation: With patient Time For Goal Achievement: 10/02/15 Potential to Achieve Goals: Fair Progress towards PT goals: Progressing toward goals    Frequency  Min 2X/week    PT Plan Current plan remains appropriate    Co-evaluation             End of  Session Equipment Utilized During Treatment: Gait belt Activity Tolerance: Patient tolerated treatment well Patient left: in chair;with call bell/phone within reach;with chair alarm set     Time: [phone] PT Time Calculation (min) (ACUTE ONLY): 26 min  Charges:  $Therapeutic Activity: 23-37 mins                    G Codes:      Van Clines Hamff 09/21/2015, 11:23 AM  Van Clines, PT  Acute Rehabilitation Services Pager [phone] Office [phone]

## 2015-09-21 NOTE — Discharge Instructions (Signed)
PENROSE DRAIN INSTRUCTIONS---ON HER BUTTOCKS -Leave the drain in place.  If it is unintentionally removed, it's okay. -Watch for swelling, increased pain or increased redness/induration/fluctuance -she has contact dermatitis from diarrhea in this area, apply a barrier cream -may apply an abdominal pad if needed for drainage, otherwise leave open to air

## 2015-09-21 NOTE — Clinical Social Work Placement (Signed)
   CLINICAL SOCIAL WORK PLACEMENT  NOTE  Date:  09/21/2015  Patient Details  Name: Jillian Wheeler MRN: [id number] Date of Birth: 11/27/1925  Clinical Social Work is seeking post-discharge placement for this patient at the Skilled  Nursing Facility level of care (*CSW will initial, date and re-position this form in  chart as items are completed):  Yes   Patient/family provided with [facility] Clinical Social Work Department's list of facilities offering this level of care within the geographic area requested by the patient (or if unable, by the patient's family).  Yes   Patient/family informed of their freedom to choose among providers that offer the needed level of care, that participate in Medicare, Medicaid or managed care program needed by the patient, have an available bed and are willing to accept the patient.  Yes   Patient/family informed of [facility]'s ownership interest in [facility] and [facility], as well as of the fact that they are under no obligation to receive care at these facilities.  PASRR submitted to EDS on       PASRR number received on       Existing PASRR number confirmed on 09/19/15     FL2 transmitted to all facilities in geographic area requested by pt/family on 09/19/15     FL2 transmitted to all facilities within larger geographic area on       Patient informed that his/her managed care company has contracts with or will negotiate with certain facilities, including the following:         09/20/15 - Patient/family informed of bed offers received.  Patient chooses bed at  [facility]     Physician recommends and patient chooses bed at      Patient to be transferred to  [facility] on  09/21/15.  Patient to be transferred to facility by  ambulance     Patient family notified on  09/21/15 of transfer.  Name of family member notified:   Son, Anaira Seay and daughter, Ms. Rauda.     PHYSICIAN Please prepare priority  discharge summary, including medications, Please prepare prescriptions, Please sign DNR, Please sign FL2     Additional Comment:    _______________________________________________ Cristobal Goldmann, LCSW 09/21/2015, 3:04 PM

## 2015-09-21 NOTE — Progress Notes (Signed)
Patient ID: Jillian Wheeler, female   DOB: [birth year], 81 y.o.   MRN: [id number]     [facility]      [address] [place], [place] [address]    Phone: [phone] FAX: [phone]     Subjective: Diarrhea.  Afebrile.  WBC unchanged at 14.4k.    Objective:  Vital signs:  Filed Vitals:   09/20/15 0826 09/20/15 1541 09/20/15 2025 09/21/15 0508  BP: 139/64 127/64 141/71 135/58  Pulse: 65 66 71 64  Temp: 98 F (36.7 C) 98.2 F (36.8 C) 98.4 F (36.9 C) 97.9 F (36.6 C)  TempSrc: Oral Oral Oral Axillary  Resp: _0 Height:      Weight:   61.689 kg (136 lb)   SpO2: 100% 100% 100% 100%    Last BM Date: 09/19/15  Intake/Output   Yesterday:  10/03 0701 - 10/04 0700 In: 920 [P.O.:920] Out: 801 [Urine:800; Stool:1] This shift:    I/O last 3 completed shifts: In: 64 [P.O.:920] Out: 1009 [Urine:1000; Stool:9]     Physical Exam: General: Pt awake/alert/oriented x4 in no acute distress  Skin: penrose drain in place, wound is clean, there is surrounding dermatitis/erythema, but looks contact, not cellulitis.  Perineal dermatitis and intertrigo.    Problem List:   Principal Problem:   Abscess, gluteal, right Active Problems:   Hyperlipidemia   OSA (obstructive sleep apnea)   DIASTOLIC HEART FAILURE, CHRONIC   GERD   Anemia   Dehydration with hyponatremia   Sepsis affecting skin   Acute renal failure (HCC)   Constipation   Malnutrition of moderate degree ([place])    Results:   Labs: Results for orders placed or performed during the hospital encounter of 09/16/15 (from the past 48 hour(s))  Vancomycin, trough     Status: None   Collection Time: 09/19/15  1:05 PM  Result Value Ref Range   Vancomycin Tr 19 10.0 - 20.0 ug/mL  C difficile quick scan w PCR reflex     Status: Abnormal   Collection Time: 09/20/15 12:02 AM  Result Value Ref Range   C Diff antigen POSITIVE (A) NEGATIVE   C Diff toxin NEGATIVE  NEGATIVE   C Diff interpretation      C. difficile present, but toxin not detected. This indicates colonization. In most cases, this does not require treatment. If patient has signs and symptoms consistent with colitis, consider treatment. Requires ENTERIC precautions.  Procalcitonin     Status: None   Collection Time: 09/20/15  4:17 AM  Result Value Ref Range   Procalcitonin 0.12 ng/mL    Comment:        Interpretation: PCT (Procalcitonin) <= 0.5 ng/mL: Systemic infection (sepsis) is not likely. Local bacterial infection is possible. (NOTE)         ICU PCT Algorithm               Non ICU PCT Algorithm    ----------------------------     ------------------------------         PCT < 0.25 ng/mL                 PCT < 0.1 ng/mL     Stopping of antibiotics            Stopping of antibiotics       strongly encouraged.               strongly encouraged.    ----------------------------     ------------------------------  PCT level decrease by               PCT < 0.25 ng/mL       >= 80% from peak PCT       OR PCT 0.25 - 0.5 ng/mL          Stopping of antibiotics                                             encouraged.     Stopping of antibiotics           encouraged.    ----------------------------     ------------------------------       PCT level decrease by              PCT >= 0.25 ng/mL       < 80% from peak PCT        AND PCT >= 0.5 ng/mL            Continuin g antibiotics                                              encouraged.       Continuing antibiotics            encouraged.    ----------------------------     ------------------------------     PCT level increase compared          PCT > 0.5 ng/mL         with peak PCT AND          PCT >= 0.5 ng/mL             Escalation of antibiotics                                          strongly encouraged.      Escalation of antibiotics        strongly encouraged.   Basic metabolic panel     Status: Abnormal   Collection Time:  09/20/15  4:17 AM  Result Value Ref Range   Sodium 130 (L) 135 - 145 mmol/L   Potassium 3.7 3.5 - 5.1 mmol/L   Chloride 104 101 - 111 mmol/L   CO2 21 (L) 22 - 32 mmol/L   Glucose, Bld 88 65 - 99 mg/dL   BUN 15 6 - 20 mg/dL   Creatinine, Ser 0.79 0.44 - 1.00 mg/dL   Calcium 7.5 (L) 8.9 - 10.3 mg/dL   GFR calc non Af Amer >60 >60 mL/min   GFR calc Af Amer >60 >60 mL/min    Comment: (NOTE) The eGFR has been calculated using the CKD EPI equation. This calculation has not been validated in all clinical situations. eGFR's persistently <60 mL/min signify possible Chronic Kidney Disease.    Anion gap 5 5 - 15  CBC     Status: Abnormal   Collection Time: 09/20/15  4:17 AM  Result Value Ref Range   WBC 14.4 (H) 4.0 - 10.5 K/uL   RBC 2.76 (L) 3.87 - 5.11 MIL/uL   Hemoglobin 9.3 (L) 12.0 - 15.0 g/dL   HCT 27.0 (L) 36.0 -  46.0 %   MCV 97.8 78.0 - 100.0 fL   MCH 33.7 26.0 - 34.0 pg   MCHC 34.4 30.0 - 36.0 g/dL   RDW 14.2 11.5 - 15.5 %   Platelets 286 150 - 400 K/uL  CBC     Status: Abnormal   Collection Time: 09/21/15  7:22 AM  Result Value Ref Range   WBC 14.4 (H) 4.0 - 10.5 K/uL   RBC 2.66 (L) 3.87 - 5.11 MIL/uL   Hemoglobin 9.1 (L) 12.0 - 15.0 g/dL   HCT 26.0 (L) 36.0 - 46.0 %   MCV 97.7 78.0 - 100.0 fL   MCH 34.2 (H) 26.0 - 34.0 pg   MCHC 35.0 30.0 - 36.0 g/dL   RDW 13.9 11.5 - 15.5 %   Platelets 287 150 - 400 K/uL    Imaging / Studies: No results found.  Medications / Allergies:  Scheduled Meds: . allopurinol  100 mg Oral Daily  . amLODipine  5 mg Oral Daily  . antiseptic oral rinse  7 mL Mouth Rinse BID  . atorvastatin  20 mg Oral q1800  . Chlorhexidine Gluconate Cloth  6 each Topical Q0600  . clonazePAM  1 mg Oral QHS  . heparin  5,000 Units Subcutaneous 3 times per day  . levothyroxine  88 mcg Oral QAC breakfast  . mupirocin ointment  1 application Nasal BID  . pantoprazole  40 mg Oral Q1200  . polyethylene glycol  17 g Oral Daily  . sodium chloride  10-40 mL  Intracatheter Q12H  . sodium chloride  3 mL Intravenous Q12H  . vancomycin  1,000 mg Intravenous Q24H   Continuous Infusions: . sodium chloride 75 mL/hr at 09/20/15 1700   PRN Meds:.acetaminophen **OR** acetaminophen, albuterol, fluticasone, HYDROcodone-acetaminophen, iohexol, morphine injection, MUSCLE RUB, sodium chloride  Antibiotics: Anti-infectives    Start     Dose/Rate Route Frequency Ordered Stop   09/17/15 1400  vancomycin (VANCOCIN) IVPB 1000 mg/200 mL premix     1,000 mg 200 mL/hr over 60 Minutes Intravenous Every 24 hours 09/16/15 1325     09/16/15 2200  piperacillin-tazobactam (ZOSYN) IVPB 3.375 g  Status:  Discontinued     3.375 g 12.5 mL/hr over 240 Minutes Intravenous 3 times per day 09/16/15 1325 09/18/15 1455   09/16/15 1300  piperacillin-tazobactam (ZOSYN) IVPB 3.375 g     3.375 g 100 mL/hr over 30 Minutes Intravenous  Once 09/16/15 1252 09/16/15 1359   09/16/15 1300  vancomycin (VANCOCIN) IVPB 1000 mg/200 mL premix     1,000 mg 200 mL/hr over 60 Minutes Intravenous  Once 09/16/15 1252 09/16/15 1430       Assessment/Plan Right buttock abscess, sepsis POD#5 I&D of right buttock wound---Dr. Redmond Pulling  -leave penrose in place, 1-2 weeks -has candida in the folds and incontinence dermatitis in the perineal region, needs nystatin and barrier cream ID- wound culture +MRSA, on Vanc. may transition to doxycycline for a total of 7 days of therapy. WBC is unchanged, afebrile, having diarrhea.  c diff antigen is positive.  Do no think source of leukocytosis is the abscess.   FEN-no issues VTE prophylaxis-scd/heparin Dispo-per primary team  Erby Pian, [facility] Pager [phone]) For consults and floor pages call [phone]([id number])  09/21/2015 8:42 AM

## 2015-09-22 LAB — ANAEROBIC CULTURE

## 2015-09-26 ENCOUNTER — Encounter (HOSPITAL_COMMUNITY): Payer: Self-pay | Admitting: *Deleted

## 2015-09-26 ENCOUNTER — Inpatient Hospital Stay (HOSPITAL_COMMUNITY)
Admission: EM | Admit: 2015-09-26 | Discharge: 2015-10-19 | DRG: 291 | Disposition: E | Payer: Commercial Managed Care - HMO | Attending: Internal Medicine | Admitting: Internal Medicine

## 2015-09-26 ENCOUNTER — Emergency Department (HOSPITAL_COMMUNITY): Payer: Commercial Managed Care - HMO

## 2015-09-26 DIAGNOSIS — K449 Diaphragmatic hernia without obstruction or gangrene: Secondary | ICD-10-CM | POA: Diagnosis present

## 2015-09-26 DIAGNOSIS — Z515 Encounter for palliative care: Secondary | ICD-10-CM

## 2015-09-26 DIAGNOSIS — Z66 Do not resuscitate: Secondary | ICD-10-CM | POA: Diagnosis present

## 2015-09-26 DIAGNOSIS — E0590 Thyrotoxicosis, unspecified without thyrotoxic crisis or storm: Secondary | ICD-10-CM | POA: Diagnosis present

## 2015-09-26 DIAGNOSIS — J45909 Unspecified asthma, uncomplicated: Secondary | ICD-10-CM | POA: Diagnosis present

## 2015-09-26 DIAGNOSIS — D631 Anemia in chronic kidney disease: Secondary | ICD-10-CM

## 2015-09-26 DIAGNOSIS — I130 Hypertensive heart and chronic kidney disease with heart failure and stage 1 through stage 4 chronic kidney disease, or unspecified chronic kidney disease: Principal | ICD-10-CM | POA: Diagnosis present

## 2015-09-26 DIAGNOSIS — E871 Hypo-osmolality and hyponatremia: Secondary | ICD-10-CM | POA: Diagnosis present

## 2015-09-26 DIAGNOSIS — N183 Chronic kidney disease, stage 3 (moderate): Secondary | ICD-10-CM | POA: Diagnosis present

## 2015-09-26 DIAGNOSIS — D519 Vitamin B12 deficiency anemia, unspecified: Secondary | ICD-10-CM | POA: Diagnosis present

## 2015-09-26 DIAGNOSIS — N179 Acute kidney failure, unspecified: Secondary | ICD-10-CM | POA: Diagnosis present

## 2015-09-26 DIAGNOSIS — I5043 Acute on chronic combined systolic (congestive) and diastolic (congestive) heart failure: Secondary | ICD-10-CM | POA: Diagnosis present

## 2015-09-26 DIAGNOSIS — Z96651 Presence of right artificial knee joint: Secondary | ICD-10-CM | POA: Diagnosis present

## 2015-09-26 DIAGNOSIS — Z886 Allergy status to analgesic agent status: Secondary | ICD-10-CM | POA: Diagnosis not present

## 2015-09-26 DIAGNOSIS — G4733 Obstructive sleep apnea (adult) (pediatric): Secondary | ICD-10-CM | POA: Diagnosis present

## 2015-09-26 DIAGNOSIS — Z79891 Long term (current) use of opiate analgesic: Secondary | ICD-10-CM

## 2015-09-26 DIAGNOSIS — Z79899 Other long term (current) drug therapy: Secondary | ICD-10-CM

## 2015-09-26 DIAGNOSIS — J9600 Acute respiratory failure, unspecified whether with hypoxia or hypercapnia: Secondary | ICD-10-CM | POA: Diagnosis present

## 2015-09-26 DIAGNOSIS — R0602 Shortness of breath: Secondary | ICD-10-CM | POA: Diagnosis present

## 2015-09-26 DIAGNOSIS — E8809 Other disorders of plasma-protein metabolism, not elsewhere classified: Secondary | ICD-10-CM | POA: Diagnosis present

## 2015-09-26 DIAGNOSIS — E872 Acidosis: Secondary | ICD-10-CM | POA: Diagnosis present

## 2015-09-26 DIAGNOSIS — R001 Bradycardia, unspecified: Secondary | ICD-10-CM

## 2015-09-26 DIAGNOSIS — Z888 Allergy status to other drugs, medicaments and biological substances status: Secondary | ICD-10-CM | POA: Diagnosis not present

## 2015-09-26 DIAGNOSIS — E1122 Type 2 diabetes mellitus with diabetic chronic kidney disease: Secondary | ICD-10-CM | POA: Diagnosis present

## 2015-09-26 DIAGNOSIS — Z7189 Other specified counseling: Secondary | ICD-10-CM | POA: Diagnosis not present

## 2015-09-26 DIAGNOSIS — D72829 Elevated white blood cell count, unspecified: Secondary | ICD-10-CM | POA: Diagnosis present

## 2015-09-26 DIAGNOSIS — F4323 Adjustment disorder with mixed anxiety and depressed mood: Secondary | ICD-10-CM | POA: Diagnosis present

## 2015-09-26 DIAGNOSIS — K219 Gastro-esophageal reflux disease without esophagitis: Secondary | ICD-10-CM | POA: Diagnosis present

## 2015-09-26 DIAGNOSIS — I5021 Acute systolic (congestive) heart failure: Secondary | ICD-10-CM

## 2015-09-26 DIAGNOSIS — Z22322 Carrier or suspected carrier of Methicillin resistant Staphylococcus aureus: Secondary | ICD-10-CM | POA: Diagnosis not present

## 2015-09-26 DIAGNOSIS — I959 Hypotension, unspecified: Secondary | ICD-10-CM | POA: Diagnosis present

## 2015-09-26 DIAGNOSIS — E222 Syndrome of inappropriate secretion of antidiuretic hormone: Secondary | ICD-10-CM | POA: Diagnosis present

## 2015-09-26 DIAGNOSIS — Z8614 Personal history of Methicillin resistant Staphylococcus aureus infection: Secondary | ICD-10-CM | POA: Diagnosis not present

## 2015-09-26 DIAGNOSIS — N189 Chronic kidney disease, unspecified: Secondary | ICD-10-CM | POA: Diagnosis not present

## 2015-09-26 DIAGNOSIS — E785 Hyperlipidemia, unspecified: Secondary | ICD-10-CM | POA: Diagnosis present

## 2015-09-26 DIAGNOSIS — D649 Anemia, unspecified: Secondary | ICD-10-CM | POA: Diagnosis present

## 2015-09-26 DIAGNOSIS — Z981 Arthrodesis status: Secondary | ICD-10-CM

## 2015-09-26 DIAGNOSIS — Z7722 Contact with and (suspected) exposure to environmental tobacco smoke (acute) (chronic): Secondary | ICD-10-CM | POA: Diagnosis present

## 2015-09-26 DIAGNOSIS — E440 Moderate protein-calorie malnutrition: Secondary | ICD-10-CM

## 2015-09-26 DIAGNOSIS — L0231 Cutaneous abscess of buttock: Secondary | ICD-10-CM | POA: Diagnosis present

## 2015-09-26 DIAGNOSIS — E1149 Type 2 diabetes mellitus with other diabetic neurological complication: Secondary | ICD-10-CM | POA: Diagnosis present

## 2015-09-26 DIAGNOSIS — D500 Iron deficiency anemia secondary to blood loss (chronic): Secondary | ICD-10-CM | POA: Diagnosis not present

## 2015-09-26 LAB — I-STAT VENOUS BLOOD GAS, ED
ACID-BASE DEFICIT: 7 mmol/L — AB (ref 0.0–2.0)
Bicarbonate: 19.3 mEq/L — ABNORMAL LOW (ref 20.0–24.0)
O2 Saturation: 78 %
PH VEN: 7.274 (ref 7.250–7.300)
TCO2: 21 mmol/L (ref 0–100)
pCO2, Ven: 41.6 mmHg — ABNORMAL LOW (ref 45.0–50.0)
pO2, Ven: 48 mmHg — ABNORMAL HIGH (ref 30.0–45.0)

## 2015-09-26 LAB — URINALYSIS, ROUTINE W REFLEX MICROSCOPIC
BILIRUBIN URINE: NEGATIVE
GLUCOSE, UA: NEGATIVE mg/dL
HGB URINE DIPSTICK: NEGATIVE
Ketones, ur: NEGATIVE mg/dL
Leukocytes, UA: NEGATIVE
Nitrite: NEGATIVE
PROTEIN: NEGATIVE mg/dL
Specific Gravity, Urine: 1.02 (ref 1.005–1.030)
UROBILINOGEN UA: 0.2 mg/dL (ref 0.0–1.0)
pH: 5 (ref 5.0–8.0)

## 2015-09-26 LAB — HEPATIC FUNCTION PANEL
ALBUMIN: 1.7 g/dL — AB (ref 3.5–5.0)
ALT: 36 U/L (ref 14–54)
AST: 36 U/L (ref 15–41)
Alkaline Phosphatase: 70 U/L (ref 38–126)
BILIRUBIN TOTAL: 0.2 mg/dL — AB (ref 0.3–1.2)
Bilirubin, Direct: <0.1 mg/dL — ABNORMAL LOW (ref 0.1–0.5)
TOTAL PROTEIN: 4.2 g/dL — AB (ref 6.5–8.1)

## 2015-09-26 LAB — CBC WITH DIFFERENTIAL/PLATELET
BASOS ABS: 0 10*3/uL (ref 0.0–0.1)
BASOS PCT: 0 %
EOS ABS: 0 10*3/uL (ref 0.0–0.7)
Eosinophils Relative: 0 %
HCT: 24.9 % — ABNORMAL LOW (ref 36.0–46.0)
HEMOGLOBIN: 8.8 g/dL — AB (ref 12.0–15.0)
LYMPHS ABS: 1.8 10*3/uL (ref 0.7–4.0)
Lymphocytes Relative: 10 %
MCH: 33.8 pg (ref 26.0–34.0)
MCHC: 35.3 g/dL (ref 30.0–36.0)
MCV: 95.8 fL (ref 78.0–100.0)
Monocytes Absolute: 0.4 10*3/uL (ref 0.1–1.0)
Monocytes Relative: 2 %
NEUTROS PCT: 88 %
Neutro Abs: 14.8 10*3/uL — ABNORMAL HIGH (ref 1.7–7.7)
Platelets: 295 10*3/uL (ref 150–400)
RBC: 2.6 MIL/uL — AB (ref 3.87–5.11)
RDW: 13.8 % (ref 11.5–15.5)
WBC: 17 10*3/uL — AB (ref 4.0–10.5)

## 2015-09-26 LAB — BASIC METABOLIC PANEL
ANION GAP: 6 (ref 5–15)
BUN: 31 mg/dL — ABNORMAL HIGH (ref 6–20)
CHLORIDE: 101 mmol/L (ref 101–111)
CO2: 21 mmol/L — ABNORMAL LOW (ref 22–32)
Calcium: 8.6 mg/dL — ABNORMAL LOW (ref 8.9–10.3)
Creatinine, Ser: 2.06 mg/dL — ABNORMAL HIGH (ref 0.44–1.00)
GFR calc non Af Amer: 20 mL/min — ABNORMAL LOW (ref >60)
GFR, EST AFRICAN AMERICAN: 24 mL/min — AB (ref >60)
Glucose, Bld: 93 mg/dL (ref 65–99)
POTASSIUM: 4.3 mmol/L (ref 3.5–5.1)
SODIUM: 128 mmol/L — AB (ref 135–145)

## 2015-09-26 LAB — I-STAT CG4 LACTIC ACID, ED: Lactic Acid, Venous: 0.91 mmol/L (ref 0.5–2.0)

## 2015-09-26 LAB — I-STAT TROPONIN, ED: TROPONIN I, POC: 0.01 ng/mL (ref 0.00–0.08)

## 2015-09-26 LAB — BRAIN NATRIURETIC PEPTIDE: B Natriuretic Peptide: 438.5 pg/mL — ABNORMAL HIGH (ref 0.0–100.0)

## 2015-09-26 MED ORDER — SODIUM CHLORIDE 0.9 % IJ SOLN
3.0000 mL | Freq: Two times a day (BID) | INTRAMUSCULAR | Status: DC
  Administered 2015-09-26 – 2015-09-28 (×7): 3 mL via INTRAVENOUS

## 2015-09-26 MED ORDER — MIRTAZAPINE 15 MG PO TABS
15.0000 mg | ORAL_TABLET | Freq: Every day | ORAL | Status: DC
Start: 2015-09-26 — End: 2015-09-29

## 2015-09-26 MED ORDER — GLYCOPYRROLATE 0.2 MG/ML IJ SOLN
0.2000 mg | INTRAMUSCULAR | Status: DC | PRN
  Filled 2015-09-26: qty 1

## 2015-09-26 MED ORDER — FUROSEMIDE 10 MG/ML IJ SOLN
60.0000 mg | Freq: Once | INTRAMUSCULAR | Status: AC
  Administered 2015-09-26: 60 mg via INTRAVENOUS
  Filled 2015-09-26: qty 6

## 2015-09-26 MED ORDER — GLYCOPYRROLATE 0.2 MG/ML IJ SOLN
0.1000 mg | Freq: Two times a day (BID) | INTRAMUSCULAR | Status: DC
  Filled 2015-09-26: qty 0.5

## 2015-09-26 MED ORDER — ALBUMIN HUMAN 25 % IV SOLN
25.0000 g | Freq: Once | INTRAVENOUS | Status: AC
  Administered 2015-09-26: 25 g via INTRAVENOUS
  Filled 2015-09-26: qty 100

## 2015-09-26 MED ORDER — MIDAZOLAM HCL 2 MG/2ML IJ SOLN
1.0000 mg | INTRAMUSCULAR | Status: DC | PRN
  Administered 2015-09-28: 2 mg via INTRAVENOUS
  Filled 2015-09-26: qty 2

## 2015-09-26 MED ORDER — MORPHINE SULFATE (PF) 2 MG/ML IV SOLN
1.0000 mg | INTRAVENOUS | Status: DC | PRN
  Administered 2015-09-26 – 2015-09-28 (×11): 1 mg via INTRAVENOUS
  Filled 2015-09-26 (×11): qty 1

## 2015-09-26 NOTE — ED Provider Notes (Signed)
CSN: [id number]     Arrival date & time Oct 06, 2015  1014 History   First MD Initiated Contact with Patient October 06, 2015 1018     Chief Complaint  Patient presents with  . Shortness of Breath  . Congestive Heart Failure     HPI   Jillian Wheeler is a 81 y.o. female with a PMH of DM, CKD, SIADH, asthma who presents to the ED with shortness of breath. Her daughter is present at bedside, who reports she had been doing well on discharge from the hospital, however was found to have worsening shortness of breath this morning at her nursing facility, and EMS was called. Patient was evaluated in the ED 09/29 and admitted with concern for sepsis, thought to be due to right gluteal abscess. Patient was treated with antibiotics and underwent incision and drainage on 09/29, culture were positive or MRSA.    Past Medical History  Diagnosis Date  . Palpitations   . Unspecified fall   . Altered mental status   . Other nonspecific finding on examination of urine   . Symptomatic menopausal or female climacteric states   . Insomnia, unspecified   . Contact dermatitis and other eczema, due to unspecified cause   . Type II or unspecified type diabetes mellitus with neurological manifestations, not stated as uncontrolled   . Dermatophytosis of nail   . Mastodynia   . Contact dermatitis and other eczema due to solvents   . Chronic kidney disease, stage III (moderate)   . Acute bronchitis   . Wheezing   . Pain in joint, pelvic region and thigh   . Other speech disturbance(784.59)   . Unspecified acute reaction to stress   . Other malaise and fatigue   . Spasm of muscle   . Blood in stool   . Other specified noninflammatory disorder of vagina   . Osteoarthrosis, unspecified whether generalized or localized, unspecified site   . Gout, unspecified   . Adjustment disorder with mixed anxiety and depressed mood   . Other vitamin B12 deficiency anemia   . Obstructive sleep apnea (adult) (pediatric)   . Other  dyspnea and respiratory abnormality   . Type II or unspecified type diabetes mellitus without mention of complication, not stated as uncontrolled   . Unspecified asthma(493.90)   . Esophageal reflux   . Other and unspecified hyperlipidemia   . Thyrotoxicosis without mention of goiter or other cause, without mention of thyrotoxic crisis or storm   . Unspecified essential hypertension   . 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 SIADH (syndrome of inappropriate ADH production) (HCC) 05/09/2015   Past Surgical History  Procedure Laterality Date  . Total knee arthroplasty Right   . Abdominal hysterectomy      still has ovaries  . Appendectomy    . Cervical fusion    . Lumbar spine surgery    . Incision and drainage perirectal abscess N/A 09/16/2015    Procedure: IRRIGATION AND DEBRIDEMENT AND DRAINAGE OR RIGHT BUTTOCK WOUND ;  Surgeon: Gaynelle Adu, MD;  Location: [facility] OR;  Service: General;  Laterality: N/A;   Family History  Problem Relation Age of Onset  . Cancer Sister   . Osteoarthritis Mother   . Heart disease    . Stroke Sister    Social History  Substance Use Topics  . Smoking status: Passive Smoke Exposure - Never Smoker  . Smokeless tobacco: None  . Alcohol Use: Yes     Comment: very rare   OB History  No data available       Review of Systems  Unable to perform ROS: Acuity of condition      Allergies  Aspirin; Celecoxib; Diclofenac sodium; Ibuprofen; Naproxen; and Sulindac  Home Medications   Prior to Admission medications   Medication Sig Start Date End Date Taking? Authorizing Provider  acetaminophen (TYLENOL) 325 MG tablet Take 325 mg by mouth at bedtime. To be taken with Tramadol   Yes Historical Provider, MD  albuterol (PROVENTIL) (2.5 MG/3ML) 0.083% nebulizer solution Take 3 mLs (2.5 mg total) by nebulization every 2 (two) hours as needed for wheezing. 05/13/15  Yes Kathlen Mody, MD  allopurinol (ZYLOPRIM) 100 MG tablet Take 100 mg by mouth daily.  02/19/14  Yes  Historical Provider, MD  amLODipine (NORVASC) 5 MG tablet Take 1 tablet (5 mg total) by mouth daily. 03/09/15  Yes Catarina Hartshorn, MD  atorvastatin (LIPITOR) 20 MG tablet Take 20 mg by mouth daily.  11/25/13  Yes Historical Provider, MD  Calcium Carbonate-Vitamin D (CALTRATE 600+D) 600-400 MG-UNIT per tablet Take 2 tablets by mouth daily. At bedtime   Yes Historical Provider, MD  clonazePAM (KLONOPIN) 1 MG tablet Take 1 tablet (1 mg total) by mouth at bedtime. 09/21/15  Yes Shanker Levora Dredge, MD  cyanocobalamin (,VITAMIN B-12,) 1000 MCG/ML injection Inject 1,000 mcg into the muscle every 30 (thirty) days. Patient gets on the 8th of every month. Per MAR   Yes Historical Provider, MD  fluticasone (FLONASE) 50 MCG/ACT nasal spray Place 2 sprays into both nostrils daily as needed for allergies or rhinitis.    Yes Historical Provider, MD  HYDROcodone-acetaminophen (NORCO/VICODIN) 5-325 MG tablet Take 1 tablet by mouth every 6 (six) hours as needed for moderate pain. 09/21/15  Yes Shanker Levora Dredge, MD  Hydrocortisone (GERHARDT'S BUTT CREAM) CREA Apply 1 application topically 3 (three) times daily. 09/21/15  Yes Shanker Levora Dredge, MD  levofloxacin (LEVAQUIN) 500 MG tablet Take 500 mg by mouth daily. For 7 days. Started on 09-22-15   Yes Historical Provider, MD  levothyroxine (SYNTHROID, LEVOTHROID) 88 MCG tablet Take 88 mcg by mouth daily before breakfast.   Yes Historical Provider, MD  magnesium hydroxide (MILK OF MAGNESIA) 400 MG/5ML suspension Take 30 mLs by mouth daily as needed for mild constipation.   Yes Historical Provider, MD  Melatonin 5 MG CAPS Take 5 mg by mouth at bedtime.    Yes Historical Provider, MD  mirtazapine (REMERON) 15 MG tablet Take 15 mg by mouth at bedtime.   Yes Historical Provider, MD  Multiple Vitamins-Minerals (DECUBI-VITE PO) Take 1 tablet by mouth daily.   Yes Historical Provider, MD  Multiple Vitamins-Minerals (PRESERVISION AREDS 2) CAPS Take 2 capsules by mouth daily at 12 noon.  09/18/15  Yes Historical Provider, MD  Omega-3 Fatty Acids (FISH OIL) 1200 MG CAPS Take 1,200 mg by mouth daily.   Yes Historical Provider, MD  Omega-3-acid Ethyl Esters (LOVAZA PO) Take 1,200 mg by mouth daily.   Yes Historical Provider, MD  pantoprazole (PROTONIX) 40 MG tablet Take 40 mg by mouth daily.   Yes Historical Provider, MD  Probiotic Product (PHILLIPS COLON HEALTH PO) Take 1 capsule by mouth daily as needed (constipation).   Yes Historical Provider, MD  doxycycline (VIBRAMYCIN) 50 MG capsule Take 2 capsules (100 mg total) by mouth 2 (two) times daily. For 3 more days from 10/4 and then discotinue 09/21/15   Maretta Bees, MD    BP 81/42 mmHg  Pulse 48  Resp 12  SpO2  98% Physical Exam  Constitutional: She appears distressed.  Chronically ill appearing female, in respiratory distress, on CPAP.  HENT:  Head: Normocephalic and atraumatic.  Right Ear: External ear normal.  Left Ear: External ear normal.  Nose: Nose normal.  Eyes: Conjunctivae and EOM are normal. Pupils are equal, round, and reactive to light. Right eye exhibits no discharge. Left eye exhibits no discharge. No scleral icterus.  Neck: Normal range of motion. Neck supple. JVD present.  Cardiovascular: Regular rhythm and intact distal pulses.  Bradycardia present.   Pulmonary/Chest: She is in respiratory distress. She has rales.  Diffuse rales to bilateral lung fields.  Abdominal: Soft. She exhibits no distension and no mass. There is no tenderness. There is no rebound and no guarding.  Musculoskeletal: She exhibits edema.  2+ pitting edema to upper and lower extremities bilaterally.  Neurological: She is alert.  Skin: Skin is warm and dry. She is not diaphoretic.  Nursing note and vitals reviewed.   ED Course  Procedures (including critical care time)  Labs Review Labs Reviewed  CBC WITH DIFFERENTIAL/PLATELET - Abnormal; Notable for the following:    WBC 17.0 (*)    RBC 2.60 (*)    Hemoglobin 8.8 (*)     HCT 24.9 (*)    Neutro Abs 14.8 (*)    All other components within normal limits  BASIC METABOLIC PANEL - Abnormal; Notable for the following:    Sodium 128 (*)    CO2 21 (*)    BUN 31 (*)    Creatinine, Ser 2.06 (*)    Calcium 8.6 (*)    GFR calc non Af Amer 20 (*)    GFR calc Af Amer 24 (*)    All other components within normal limits  I-STAT VENOUS BLOOD GAS, ED - Abnormal; Notable for the following:    pCO2, Ven 41.6 (*)    pO2, Ven 48.0 (*)    Bicarbonate 19.3 (*)    Acid-base deficit 7.0 (*)    All other components within normal limits  BRAIN NATRIURETIC PEPTIDE  URINALYSIS, ROUTINE W REFLEX MICROSCOPIC (NOT AT [facility])  HEPATIC FUNCTION PANEL  I-STAT TROPOININ, ED  I-STAT CG4 LACTIC ACID, ED    Imaging Review Dg Chest Port 1 View  October 14, 2015   CLINICAL DATA:  Shortness of breath.  Asthma.  EXAM: PORTABLE CHEST 1 VIEW  COMPARISON:  09/16/2015  FINDINGS: Cardiomediastinal silhouette is normal. Mediastinal contours appear intact.  There is no evidence of pneumothorax. There are bilateral pleural effusions, right larger than left, with bilateral lower lobe atelectasis.  Osseous structures are without acute abnormality. Soft tissues are grossly normal.  IMPRESSION: Bilateral pleural effusions, right larger than left, with bilateral lower lobe atelectasis.   Electronically Signed   By: Ted Mcalpine M.D.   On: 10-14-2015 11:11     I have personally reviewed and evaluated these images and lab results as part of my medical decision-making.   EKG Interpretation   Date/Time:  Sunday 14-Oct-2015 10:44:23 EDT Ventricular Rate:  79 PR Interval:    QRS Duration: 118 QT Interval:  632 QTC Calculation: 725 R Axis:   -9 Text Interpretation:  Probable sinus bradycardia, verify A-V conduction  Nonspecific intraventricular conduction delay Repolarization abnormality,  prob rate related Abnormal ekg Confirmed by BEATON  MD, ROBERT ([id number]) on  10/14/15 10:55:43 AM       MDM   Final diagnoses:  Shortness of breath    81 year old female presents with acute onset  shortness of breath, which was noted this morning at her nursing facility. She was recently hospitalized for sepsis due to right gluteal abscess, which was drained. She was treated with antibiotics and discharged to her nursing facility on 10/04. Her family members reports she has been doing well overall until this morning.  Patient is afebrile. HR bradycardic to 30s-40s. BP initially 70s systolic. O2 sat stable on CPAP. Diffuse rales to lung fields bilaterally. Abdomen soft, non-tender, non-distended. 2+ pitting edema to upper and lower extremities bilaterally. JVD elevated.   Given 60 mg lasix given concern for CHF exacerbation. CXR with bilateral pleural effusions R>L with bilateral lower lobe atelectasis. EKG no acute ischemia. Troponin negative x 1.  CBC remarkable for leukocytosis with WBC 17; WBC 14 prior to discharge from hospital. Hemoglobin 8.8, which appears stable. Lactic acid within normal limits. BMP remarkable for creatinine 2.06, elevated from baseline, sodium 128. BNP elevated at 438. UA negative for infection, hemoglobin, protein. Hepatic function panel remarkable for low albumin at 1.7. VBG demonstrates metabolic acidosis.  Findings discussed with daughter Melina Modena) and son. Throughout the patient's course in the ED, her daughter has expressed that the patient is DNR and would not want any extreme measures taken.  Hospitalist consulted regarding admission. Spoke with Dr. Maryfrances Bunnell, who requested blood and urine cultures and will see the patient in the ED. Patient to be admitted for further management.   BP 111/62 mmHg  Pulse 37  Resp 16  SpO2 99%       Mady Gemma, PA-C 10/11/2015 1429  Nelva Nay, MD 10/11/2015 [phone]

## 2015-09-26 NOTE — Consult Note (Signed)
Consultation Note Date: 2015/10/21   Patient Name: Jillian Wheeler  DOB: 24-Aug-1925  MRN: [id number]  Age / Sex: 81 y.o., female   PCP: Chilton Greathouse, MD Referring Physician: No att. providers found  Reason for Consultation: Establishing goals of care  Palliative Care Assessment and Plan Summary of Established Goals of Care and Medical Treatment Preferences  Jillian Wheeler is a 81 y.o. female with a past medical history significant for chronic diastolic CHF, DM, asthma, hiatal hernia, hx MRSA and hx SIADH who presents from NH with dyspnea.  History is collected from the patient's daughter who is at the bedside as well as chart review. The patient was recently discharged from the hospital 4 days ago after sepsis from an MRSA gluteal cleft abscess that was drained surgicalland discharged on oral antibiotics. The patient had been recovering in rehabilitation until this morning when family were called because of acute dyspnea. Family felt that the patient appeared swollen and her arms and legs, short of breath and having difficulty speaking.  In the ED, The patient had acute renal failure, metabolic acidosis, increase in WBC, hypotension, and bradycardia to the 30s. Because of concern for fluid overload the patient was given Lasix by ED personnel.  The patient has been admitted to the hospitalist service with acute diastolic CHF, bradycardia, acute renal failure. She appears to be actively dying. Palliative consult has been obtained for additional supportive care and for medication management.   The patient is unresponsive, she is on BIPAP, she is on IVF and IV Albumin. Her family is holding vigil at the bedside, family voices concerns about her recent hospitalization and discharge,they are requesting autopsy if she dies in the hospital.   Discussed briefly, the patient's age and her co morbidities. The family states that prior to the gluteal abscess, the patient was in relatively good health.  Voiced understanding of their concern, offered support and active listening. Discussed and agreed upon, the patient's current need for PRN Morphine Versed and Robinul.   Thank you for the consult, we will follow along.   Contacts/Participants in Discussion: Primary Decision Maker:  Patient's son and daughter present at the bedside.    HCPOA: yes     Code Status/Advance Care Planning:  DNR DNI  Symptom Management:   Agree with Morphine Versed and Robinul PRN.   Palliative Prophylaxis: yes  Additional Recommendations (Limitations, Scope, Preferences):   family wants autopsy if the patient dies in the hospital Psycho-social/Spiritual:   Support System: strong, son daughter, sister  Desire for further Chaplaincy support:no  Prognosis: Hours - Days  Discharge Planning:  likely hospital death    Values: comfort care, how ever, patient's son and daughter wish for continuation of IVF with IV Albumin as well as BIPAP for now. They also desire autopsy if the patient dies in the hospital.  Life limiting illness: acute renal failure, acute metabolic acidosis,recent gluteal abscess, acute on chronic diastolic CHF,? Aspiration.       Chief Complaint/History of Present Illness: dyspnea   Primary Diagnoses  Present on Admission:  . Shortness of breath . OSA (obstructive sleep apnea) . Hyponatremia . Anemia . Abscess, gluteal, right . Malnutrition of moderate degree (HCC) . Leukocytosis . Bradycardia  Palliative Review of Systems:  completed I have reviewed the medical record, interviewed the patient and family, and examined the patient. The following aspects are pertinent.  Past Medical History  Diagnosis Date  . Palpitations   . Unspecified fall   . Altered mental status   .  Other nonspecific finding on examination of urine   . Symptomatic menopausal or female climacteric states   . Insomnia, unspecified   . Contact dermatitis and other eczema, due to unspecified  cause   . Type II or unspecified type diabetes mellitus with neurological manifestations, not stated as uncontrolled   . Dermatophytosis of nail   . Mastodynia   . Contact dermatitis and other eczema due to solvents   . Chronic kidney disease, stage III (moderate)   . Acute bronchitis   . Wheezing   . Pain in joint, pelvic region and thigh   . Other speech disturbance(784.59)   . Unspecified acute reaction to stress   . Other malaise and fatigue   . Spasm of muscle   . Blood in stool   . Other specified noninflammatory disorder of vagina   . Osteoarthrosis, unspecified whether generalized or localized, unspecified site   . Gout, unspecified   . Adjustment disorder with mixed anxiety and depressed mood   . Other vitamin B12 deficiency anemia   . Obstructive sleep apnea (adult) (pediatric)   . Other dyspnea and respiratory abnormality   . Type II or unspecified type diabetes mellitus without mention of complication, not stated as uncontrolled   . Unspecified asthma(493.90)   . Esophageal reflux   . Other and unspecified hyperlipidemia   . Thyrotoxicosis without mention of goiter or other cause, without mention of thyrotoxic crisis or storm   . Unspecified essential hypertension   . 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 SIADH (syndrome of inappropriate ADH production) (HCC) 05/09/2015   Social History   Social History  . Marital Status: Widowed    Spouse Name: N/A  . Number of Children: N/A  . Years of Education: N/A   Social History Main Topics  . Smoking status: Passive Smoke Exposure - Never Smoker  . Smokeless tobacco: None  . Alcohol Use: Yes     Comment: very rare  . Drug Use: No  . Sexual Activity: Not Asked   Other Topics Concern  . None   Social History Narrative   Family History  Problem Relation Age of Onset  . Cancer Sister   . Osteoarthritis Mother   . Heart disease    . Stroke Sister    Scheduled Meds: . mirtazapine  15 mg Oral QHS  . sodium chloride  3 mL  Intravenous Q12H   Continuous Infusions:  PRN Meds:.glycopyrrolate, midazolam, morphine injection Medications Prior to Admission:  Prior to Admission medications   Medication Sig Start Date End Date Taking? Authorizing Provider  acetaminophen (TYLENOL) 325 MG tablet Take 325 mg by mouth at bedtime. To be taken with Tramadol   Yes Historical Provider, MD  albuterol (PROVENTIL) (2.5 MG/3ML) 0.083% nebulizer solution Take 3 mLs (2.5 mg total) by nebulization every 2 (two) hours as needed for wheezing. 05/13/15  Yes Kathlen Mody, MD  allopurinol (ZYLOPRIM) 100 MG tablet Take 100 mg by mouth daily.  02/19/14  Yes Historical Provider, MD  amLODipine (NORVASC) 5 MG tablet Take 1 tablet (5 mg total) by mouth daily. 03/09/15  Yes Catarina Hartshorn, MD  atorvastatin (LIPITOR) 20 MG tablet Take 20 mg by mouth daily.  11/25/13  Yes Historical Provider, MD  Calcium Carbonate-Vitamin D (CALTRATE 600+D) 600-400 MG-UNIT per tablet Take 2 tablets by mouth daily. At bedtime   Yes Historical Provider, MD  clonazePAM (KLONOPIN) 1 MG tablet Take 1 tablet (1 mg total) by mouth at bedtime. 09/21/15  Yes Shanker Levora Dredge, MD  cyanocobalamin (,  VITAMIN B-12,) 1000 MCG/ML injection Inject 1,000 mcg into the muscle every 30 (thirty) days. Patient gets on the 8th of every month. Per MAR   Yes Historical Provider, MD  fluticasone (FLONASE) 50 MCG/ACT nasal spray Place 2 sprays into both nostrils daily as needed for allergies or rhinitis.    Yes Historical Provider, MD  HYDROcodone-acetaminophen (NORCO/VICODIN) 5-325 MG tablet Take 1 tablet by mouth every 6 (six) hours as needed for moderate pain. 09/21/15  Yes Shanker Levora Dredge, MD  Hydrocortisone (GERHARDT'S BUTT CREAM) CREA Apply 1 application topically 3 (three) times daily. 09/21/15  Yes Shanker Levora Dredge, MD  levofloxacin (LEVAQUIN) 500 MG tablet Take 500 mg by mouth daily. For 7 days. Started on 09-22-15   Yes Historical Provider, MD  levothyroxine (SYNTHROID, LEVOTHROID) 88 MCG tablet  Take 88 mcg by mouth daily before breakfast.   Yes Historical Provider, MD  magnesium hydroxide (MILK OF MAGNESIA) 400 MG/5ML suspension Take 30 mLs by mouth daily as needed for mild constipation.   Yes Historical Provider, MD  Melatonin 5 MG CAPS Take 5 mg by mouth at bedtime.    Yes Historical Provider, MD  mirtazapine (REMERON) 15 MG tablet Take 15 mg by mouth at bedtime.   Yes Historical Provider, MD  Multiple Vitamins-Minerals (DECUBI-VITE PO) Take 1 tablet by mouth daily.   Yes Historical Provider, MD  Multiple Vitamins-Minerals (PRESERVISION AREDS 2) CAPS Take 2 capsules by mouth daily at 12 noon. 09/18/15  Yes Historical Provider, MD  Omega-3 Fatty Acids (FISH OIL) 1200 MG CAPS Take 1,200 mg by mouth daily.   Yes Historical Provider, MD  Omega-3-acid Ethyl Esters (LOVAZA PO) Take 1,200 mg by mouth daily.   Yes Historical Provider, MD  pantoprazole (PROTONIX) 40 MG tablet Take 40 mg by mouth daily.   Yes Historical Provider, MD  Probiotic Product (PHILLIPS COLON HEALTH PO) Take 1 capsule by mouth daily as needed (constipation).   Yes Historical Provider, MD   Allergies  Allergen Reactions  . Aspirin Other (See Comments)    Upset stomach  . Celecoxib Other (See Comments)    Upset stomach  . Diclofenac Sodium Other (See Comments)    Pt does not remember   . Ibuprofen Other (See Comments)    Upset stomach   . Naproxen Other (See Comments)    Upset stomach   . Sulindac Other (See Comments)    Pt does not remember    CBC:    Component Value Date/Time   WBC 17.0* 09/30/15 1045   HGB 8.8* Sep 30, 2015 1045   HCT 24.9* 2015-09-30 1045   PLT 295 September 30, 2015 1045   MCV 95.8 2015/09/30 1045   NEUTROABS 14.8* 09-30-2015 1045   LYMPHSABS 1.8 09-30-15 1045   MONOABS 0.4 09/30/15 1045   EOSABS 0.0 2015-09-30 1045   BASOSABS 0.0 09-30-2015 1045   Comprehensive Metabolic Panel:    Component Value Date/Time   NA 128* 2015/09/30 1045   K 4.3 09-30-15 1045   CL 101 09-30-15  1045   CO2 21* 09-30-15 1045   BUN 31* 09/30/15 1045   CREATININE 2.06* 2015/09/30 1045   GLUCOSE 93 09-30-2015 1045   CALCIUM 8.6* 09/30/2015 1045   AST 36 September 30, 2015 1045   ALT 36 09/30/2015 1045   ALKPHOS 70 09/30/15 1045   BILITOT 0.2* Sep 30, 2015 1045   PROT 4.2* Sep 30, 2015 1045   ALBUMIN 1.7* 30-Sep-2015 1045    Physical Exam: Vital Signs: BP 111/62 mmHg  Pulse 37  Resp 16  SpO2 99% SpO2:  SpO2: 99 % O2 Device: O2 Device: Bi-PAP O2 Flow Rate: O2 Flow Rate (L/min): 0 L/min Intake/output summary: No intake or output data in the 24 hours ending 10-14-15 1722 LBM:   Baseline Weight:   Most recent weight:    Exam Findings:  Pale lady unresponsive on BiPap In no distress Shallow breathing S1 S2 Abdomen soft Extremities warm to touch, no edema Unresponsive                        Palliative Performance Scale: 10  Additional Data Reviewed: Recent Labs     10/14/2015  1045  WBC  17.0*  HGB  8.8*  PLT  295  NA  128*  BUN  31*  CREATININE  2.06*     Time In: 1615 Time Out: 1715 Time Total: 60 Greater than 50%  of this time was spent counseling and coordinating care related to the above assessment and plan.  Signed by: Rosalin Hawking, MD [id number] Rosalin Hawking, MD  2015/10/14, 5:22 PM  Please contact Palliative Medicine Team phone at [phone] for questions and concerns.

## 2015-09-26 NOTE — ED Notes (Signed)
PT transported via EMS to ED on CPAP. Report that Pt normally is talkative but staff reports this morning PT was short of breath. Pt is a DNR with limited treatment.SEE DNR papers.Onarrival Pt placed on Hosp CPAP per RT. Pt 's is edematous all over.

## 2015-09-26 NOTE — H&P (Signed)
History and Physical  Jillian Wheeler  ZOX:[id number]  DOB: 1925/03/10  DOA: 10/10/2015  Referring physician: Nelva Nay, MD PCP: Hoyle Sauer, MD   Chief Complaint: Dyspnea  HPI: Jillian Wheeler is a 81 y.o. female with a past medical history significant for chronic diastolic CHF, DM, asthma, hiatal hernia, hx MRSA and hx SIADH who presents from NH with dyspnea.  History is collected from the patient's daughter who is at the bedside as well as chart review. The patient was recently discharged from the hospital 4 days ago after sepsis from an MRSA gluteal cleft abscess that was drained surgicalland discharged on oral antibiotics.  The patient had been recovering in rehabilitation until this morning when family were called because of acute dyspnea. Family felt that the patient appeared swollen and her arms and legs, short of breath and having difficulty speaking.  In the ED, The patient had acute renal failure, metabolic acidosis, increase in WBC, hypotension, and bradycardia to the 30s.   Because of concern for fluid overload the patient was given Lasix by ED personnel.    Review of Systems:  Patient seen 1:18 PM on 10/02/2015. The patient is unable to provide ROS because of altered mentation.    The following past medical/surgical history, social history and family history are reviewed from the chart, as patient is unable to provide given mentation. Past Medical History  Diagnosis Date  . Palpitations   . Unspecified fall   . Altered mental status   . Other nonspecific finding on examination of urine   . Symptomatic menopausal or female climacteric states   . Insomnia, unspecified   . Contact dermatitis and other eczema, due to unspecified cause   . Type II or unspecified type diabetes mellitus with neurological manifestations, not stated as uncontrolled   . Dermatophytosis of nail   . Mastodynia   . Contact dermatitis and other eczema due to solvents   . Chronic kidney  disease, stage III (moderate)   . Acute bronchitis   . Wheezing   . Pain in joint, pelvic region and thigh   . Other speech disturbance(784.59)   . Unspecified acute reaction to stress   . Other malaise and fatigue   . Spasm of muscle   . Blood in stool   . Other specified noninflammatory disorder of vagina   . Osteoarthrosis, unspecified whether generalized or localized, unspecified site   . Gout, unspecified   . Adjustment disorder with mixed anxiety and depressed mood   . Other vitamin B12 deficiency anemia   . Obstructive sleep apnea (adult) (pediatric)   . Other dyspnea and respiratory abnormality   . Type II or unspecified type diabetes mellitus without mention of complication, not stated as uncontrolled   . Unspecified asthma(493.90)   . Esophageal reflux   . Other and unspecified hyperlipidemia   . Thyrotoxicosis without mention of goiter or other cause, without mention of thyrotoxic crisis or storm   . Unspecified essential hypertension   . 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 SIADH (syndrome of inappropriate ADH production) (HCC) 05/09/2015  The above past medical history was reviewed.  Past Surgical History  Procedure Laterality Date  . Total knee arthroplasty Right   . Abdominal hysterectomy      still has ovaries  . Appendectomy    . Cervical fusion    . Lumbar spine surgery    . Incision and drainage perirectal abscess N/A 09/16/2015    Procedure: IRRIGATION AND DEBRIDEMENT AND DRAINAGE OR RIGHT BUTTOCK  WOUND ;  Surgeon: Gaynelle Adu, MD;  Location: [facility] OR;  Service: General;  Laterality: N/A;  The above surgical history was reviewed.  Recent gluteal abscess drainage.  Social History: Patient has been living in a nursing home since rehab.  There is not a documented history of smoking.    Allergies  Allergen Reactions  . Aspirin Other (See Comments)    Upset stomach  . Celecoxib Other (See Comments)    Upset stomach  . Diclofenac Sodium Other (See Comments)    Pt does not  remember   . Ibuprofen Other (See Comments)    Upset stomach   . Naproxen Other (See Comments)    Upset stomach   . Sulindac Other (See Comments)    Pt does not remember     Family History  Problem Relation Age of Onset  . Cancer Sister   . Osteoarthritis Mother   . Heart disease    . Stroke Sister     Prior to Admission medications   Medication Sig Start Date End Date Taking? Authorizing Provider  acetaminophen (TYLENOL) 325 MG tablet Take 325 mg by mouth at bedtime. To be taken with Tramadol   Yes Historical Provider, MD  albuterol (PROVENTIL) (2.5 MG/3ML) 0.083% nebulizer solution Take 3 mLs (2.5 mg total) by nebulization every 2 (two) hours as needed for wheezing. 05/13/15  Yes Kathlen Mody, MD  allopurinol (ZYLOPRIM) 100 MG tablet Take 100 mg by mouth daily.  02/19/14  Yes Historical Provider, MD  amLODipine (NORVASC) 5 MG tablet Take 1 tablet (5 mg total) by mouth daily. 03/09/15  Yes Catarina Hartshorn, MD  atorvastatin (LIPITOR) 20 MG tablet Take 20 mg by mouth daily.  11/25/13  Yes Historical Provider, MD  Calcium Carbonate-Vitamin D (CALTRATE 600+D) 600-400 MG-UNIT per tablet Take 2 tablets by mouth daily. At bedtime   Yes Historical Provider, MD  clonazePAM (KLONOPIN) 1 MG tablet Take 1 tablet (1 mg total) by mouth at bedtime. 09/21/15  Yes Shanker Levora Dredge, MD  cyanocobalamin (,VITAMIN B-12,) 1000 MCG/ML injection Inject 1,000 mcg into the muscle every 30 (thirty) days. Patient gets on the 8th of every month. Per MAR   Yes Historical Provider, MD  fluticasone (FLONASE) 50 MCG/ACT nasal spray Place 2 sprays into both nostrils daily as needed for allergies or rhinitis.    Yes Historical Provider, MD  HYDROcodone-acetaminophen (NORCO/VICODIN) 5-325 MG tablet Take 1 tablet by mouth every 6 (six) hours as needed for moderate pain. 09/21/15  Yes Shanker Levora Dredge, MD  Hydrocortisone (GERHARDT'S BUTT CREAM) CREA Apply 1 application topically 3 (three) times daily. 09/21/15  Yes Shanker Levora Dredge, MD  levofloxacin (LEVAQUIN) 500 MG tablet Take 500 mg by mouth daily. For 7 days. Started on 09-22-15   Yes Historical Provider, MD  levothyroxine (SYNTHROID, LEVOTHROID) 88 MCG tablet Take 88 mcg by mouth daily before breakfast.   Yes Historical Provider, MD  magnesium hydroxide (MILK OF MAGNESIA) 400 MG/5ML suspension Take 30 mLs by mouth daily as needed for mild constipation.   Yes Historical Provider, MD  Melatonin 5 MG CAPS Take 5 mg by mouth at bedtime.    Yes Historical Provider, MD  mirtazapine (REMERON) 15 MG tablet Take 15 mg by mouth at bedtime.   Yes Historical Provider, MD  Multiple Vitamins-Minerals (DECUBI-VITE PO) Take 1 tablet by mouth daily.   Yes Historical Provider, MD  Multiple Vitamins-Minerals (PRESERVISION AREDS 2) CAPS Take 2 capsules by mouth daily at 12 noon. 09/18/15  Yes Historical Provider,  MD  Omega-3 Fatty Acids (FISH OIL) 1200 MG CAPS Take 1,200 mg by mouth daily.   Yes Historical Provider, MD  Omega-3-acid Ethyl Esters (LOVAZA PO) Take 1,200 mg by mouth daily.   Yes Historical Provider, MD  pantoprazole (PROTONIX) 40 MG tablet Take 40 mg by mouth daily.   Yes Historical Provider, MD  Probiotic Product (PHILLIPS COLON HEALTH PO) Take 1 capsule by mouth daily as needed (constipation).   Yes Historical Provider, MD    Physical Exam: BP 111/62 mmHg  Pulse 48  Resp 12  SpO2 93% General appearance: Elderly frail appearing female, in extremis.  Wearing Bipap and not responding to verbal stimuli.  Eyes: Sclerae normal without icterus, conjunctiva pink, lids and lashes normal.     Skin: Pale and dry.  No jaundice.  Cardiac: Bradycardic regular, nl S1-S2, no murmurs appreciated, but exam limited.  No JVD. No pitting edema.   Extremities pale but warm. Respiratory: Tachypneic, shallow.  Coarse breath sounds and bilateral rales. Abdomen: BS present.  Abdomen soft without rigidity. No ascites, distension.   Neuro: Lethargic.  Eyes deviated to right.  Does not move  purposefully or respond to commands.       Labs on Admission:  The metabolic panel is notable for hyponatremia, normal potassium, decreased bicarbonate, serum creatinine 2.06 from a baseline of 0.6 g/dL.  Calcium level corrects to mildly elevated, given hypoalbuminemia. Lactic acid is 0.91 mmol/L. Venous blood gas shows a pH of 7.27, PCO2 41. BNP 438 pg/ml. Complete blood count is notable fleukocytosis 17K/ul, at discharge 14K/uL.  Hemoglobin stable at 8.8 g/dL. Troponin negative. Urinalysis unremarkable Albumin at discharge 1.9 g/dL.   Radiological Exams on Admission: Personally reviewed: Dg Chest Port 1 View 27-Sep-2015    Bilateral pleural effusions, otherwise stable from previous.    EKG: Independently reviewed. Junctional rhythm, heart rate in 40s.    Assessment/Plan Principal Problem:   Acute systolic heart failure (HCC) Active Problems:   OSA (obstructive sleep apnea)   Shortness of breath   Hyponatremia   Anemia   MRSA carrier   Leukocytosis   Abscess, gluteal, right   Malnutrition of moderate degree (HCC)   Junctional bradycardia   Goals of care, counseling/discussion   Bradycardia  1.  Bradycardia complicated by acute diastolic heart failure: This is new.  The ECG appears to show a junctional rhythm.  Discussed with Cardiology and Palliative Care by phone, regarding use of external pacing and isoproterenol, both of which are inconsistent with the patient's desires for minimizing invasive treatments and preference for comfort measures.  Potassium and calcium WNL. Hyponatremia stable from discharge.  Discussed poor prognosis with family, who acknowledge patient's desire for comfort measures over heroic efforts -- Morphine 1 mg q15 min for dyspnea -- midazolam 1-2 mg q1 hrs for agitation -- glycopyrrholate 0.1 mg twice daily for secretions -- Consult to Palliative Care  2. Hypotension: This is new.  Related to #1 above. -- Albumin 25g once -- Hold further  furosemide  3. Leukocytosis: Patient's discharge WBC was 14K/uL.  From chart review of outside records from her nursing home, it appears they saw new infiltrates on CXR the day after last discharge (4 days ago) and started levofloxacin orally for suspected pneumonia. -- Hold levofloxacin -- I will refrain from starting additional antibiotics, given that I don't think this alone is responsible for the patient's bradycardia and given the overall goal of care, initiating antibiotics would not reasonably be expected to improve the patient's symptoms  3.  Hyponatremia:  This is stable from previous.  4. Gluteal abscess:  The patient completed her course of doxycycline.  5. Malnutrition Stable.  -- Comfort cares     DVT PPx: None, comfort cares Diet: NPO Consultants: Palliative care Code Status: DNR Family Communication: The patient's diagnosis, treatment plan, Code status and prognosis were dsicussed with the daughter/POA and son at the bedside.   All questions were answered.    Disposition Plan:  At the time of admission, it appears that the appropriate admission status for this patient is INPATIENT. This is judged to be reasonable and necessary in order to provide the required intensity of service to ensure the patient's safety and symptoms control given the presenting symptoms, physical exam findings, and initial radiographic and laboratory data in the context of their chronic comorbidities.    The patient's daughter would like to discuss inpatient Hospice placement, if the patient should survive until tomorrow.     Alberteen Sam [facility] Pager [phone]

## 2015-09-27 LAB — URINE CULTURE: CULTURE: NO GROWTH

## 2015-09-27 NOTE — Progress Notes (Signed)
   09/27/15 1430  Clinical Encounter Type  Visited With Patient not available  Visit Type Spiritual support  Referral From Nurse  Stress Factors  Patient Stress Factors None identified  Chaplain stopped to see patient at suggestion of nurse. Patient had visitor who indicated their pastor had just left and they did not need anything.

## 2015-09-27 NOTE — Progress Notes (Signed)
RT d/c Bipap per order Pt placed on 100 % nonrebreather 02 Sat 100 % breathing shallow rate 26 . Pt can open eyes and squeeze hand weakly. Frequent oral care done and mouth moisturizer used on mouth and lips. Pt's son at bedside.

## 2015-09-27 NOTE — Progress Notes (Signed)
CSW received consult for residential hospice- per palliative and MD note anticipate hospital death  CSW signing off- please reconsult if needed  Merlyn Lot, [facility] Clinical Social Worker [phone]

## 2015-09-27 NOTE — Progress Notes (Addendum)
Pt daughter says her mom looks somewhat uncomfortable - Has furrowed brow intermittently. Pt still able to grip very weakly with Rt hand. Unable to get a BP on several extremities. HR in the mid to high 20's. Still on 100 % nonrebreather mask. Frequent oral care and turned every 2 hours Skin cool to touch.

## 2015-09-27 NOTE — Progress Notes (Signed)
Nutrition Brief Note  Pt identified on the Malnutrition Screening Tool Report. Goal of care is comfort; anticipated hospital death. No nutrition interventions warranted at this time.  Please consult as needed.   Maureen Chatters, RD, LDN Pager #: [phone] After-Hours Pager #: [phone]

## 2015-09-27 NOTE — Progress Notes (Addendum)
[facility] PROGRESS NOTE  Jillian Wheeler ZOX:[id number] DOB: [birth year] DOA: 09/20/2015 PCP: Hoyle Sauer, MD  Length of stay: 1   Assessment/Plan: Principal Problem:   Acute systolic heart failure (HCC) Active Problems:   OSA (obstructive sleep apnea)   Shortness of breath   Hyponatremia   Anemia   MRSA carrier   Leukocytosis   Abscess, gluteal, right   Malnutrition of moderate degree (HCC)   Junctional bradycardia   Goals of care, counseling/discussion   Bradycardia   Encounter for palliative care    1. Bradycardia complicated by acute diastolic heart failure: This is new since last admission. The ECG appears to show a junctional rhythm. Cardiology and palliative care made aware, patient on full comfort care measures at this time, family agreeable. Discussed poor prognosis with family, who acknowledge patient's desire for comfort measures over heroic efforts -- Morphine 1 mg q15 min for dyspnea -- midazolam 1-2 mg q1 hrs for agitation -- glycopyrrholate 0.1 mg twice daily for secretions Appreciated palliative care recommendations, will expect hospital death  Acute Respiratory failure-discontinue BiPAP, as this is probably not going to  change prognosis, C/I in an obtunded patient   2. Hypotension: This is new. Related to #1 above. -- Received Albumin 25g once -- Hold further furosemide  3. Leukocytosis: Patient's discharge WBC was 14K/uL. From chart review of outside records from her nursing home, it appears they saw new infiltrates on CXR the day after last discharge (4 days ago) and started levofloxacin orally for suspected pneumonia. No further antibiotics treatment,  3. Hyponatremia:  This is stable from previous. No further labs  4. Gluteal abscess:  The patient completed her course of doxycycline.  5. Malnutrition Stable.  --Patient currently on Comfort cares    DVT prophylaxsis none  Code Status:      Code Status Orders         Start     Ordered   10/09/2015 1439  Do not attempt resuscitation (DNR)   Continuous    Question Answer Comment  In the event of cardiac or respiratory ARREST Do not call a "code blue"   In the event of cardiac or respiratory ARREST Do not perform Intubation, CPR, defibrillation or ACLS   In the event of cardiac or respiratory ARREST Use medication by any route, position, wound care, and other measures to relive pain and suffering. May use oxygen, suction and manual treatment of airway obstruction as needed for comfort.      09/20/2015 1438    Advance Directive Documentation        Most Recent Value   Type of Advance Directive  Living will, Healthcare Power of Attorney   Pre-existing out of facility DNR order (yellow form or pink MOST form)     "MOST" Form in Place?       Family Communication: family updated about patient's clinical progress Disposition Plan:  As above    Brief narrative: Jillian Wheeler is a 81 y.o. female with a past medical history significant for chronic diastolic CHF, DM, asthma, hiatal hernia, hx MRSA and hx SIADH who presents from NH with dyspnea.  History is collected from the patient's daughter who is at the bedside as well as chart review. The patient was recently discharged from the hospital 4 days ago after sepsis from an MRSA gluteal cleft abscess that was drained surgicalland discharged on oral antibiotics. The patient had been recovering in rehabilitation until this morning when family were  called because of acute dyspnea. Family felt that the patient appeared swollen and her arms and legs, short of breath and having difficulty speaking.  In the ED, The patient had acute renal failure, metabolic acidosis, increase in WBC, hypotension, and bradycardia to the 30s. Because of concern for fluid overload the patient was given Lasix by ED personnel.   Consultants: Rosalin Hawking, MD  Procedures:  None  Antibiotics: Anti-infectives    None          HPI/Subjective: Patient somnolent, unarousable, on comfort care measures, bradycardic and unresponsive  Objective: Filed Vitals:   Oct 05, 2015 2314 09/27/15 0427 09/27/15 0800 09/27/15 0814  BP:      Pulse: 29 25 25 26   Resp: 31 29 14  34  SpO2: 100% 100% 100%     Intake/Output Summary (Last 24 hours) at 09/27/15 1119 Last data filed at 2015/10/05 2234  Gross per 24 hour  Intake      3 ml  Output      0 ml  Net      3 ml    Exam:  General: Obtunded, hypotensive Lungs: Clear to auscultation bilaterally without wheezes or crackles Cardiovascular: Regular rate and rhythm without murmur gallop or rub normal S1 and S2 Abdomen: Nontender, nondistended, soft, bowel sounds positive, no rebound, no ascites, no appreciable mass Extremities: No significant cyanosis, clubbing, or edema bilateral lower extremities     Data Review   Micro Results Recent Results (from the past 240 hour(s))  C difficile quick scan w PCR reflex     Status: Abnormal   Collection Time: 09/20/15 12:02 AM  Result Value Ref Range Status   C Diff antigen POSITIVE (A) NEGATIVE Final   C Diff toxin NEGATIVE NEGATIVE Final   C Diff interpretation   Final    C. difficile present, but toxin not detected. This indicates colonization. In most cases, this does not require treatment. If patient has signs and symptoms consistent with colitis, consider treatment. Requires ENTERIC precautions.  Urine culture     Status: None   Collection Time: 05-Oct-2015 12:27 PM  Result Value Ref Range Status   Specimen Description URINE, CATHETERIZED  Final   Special Requests NONE  Final   Culture NO GROWTH 1 DAY  Final   Report Status 09/27/2015 FINAL  Final    Radiology Reports Ct Abdomen Pelvis Wo Contrast  09/16/2015   CLINICAL DATA:  Increasing weakness over the past 14 days. Intermittent lower abdominal pain since February, 2016. Constipation. Urinary tract infection. Elevated white blood cell count. Subsequent  encounter.  EXAM: CT ABDOMEN AND PELVIS WITHOUT CONTRAST  TECHNIQUE: Multidetector CT imaging of the abdomen and pelvis was performed following the standard protocol without IV contrast.  COMPARISON:  CT abdomen and pelvis 05/06/2015 and 12/23/2014.  FINDINGS: Hiatal hernia is noted. No pleural or pericardial effusion. Heart size is upper normal. Dependent atelectasis is seen lung bases.  A few small stones are seen in the gallbladder. The liver, spleen, adrenal glands and pancreas are unremarkable. There is cortical atrophy and kidneys. Small cyst in the upper pole of the left kidney is again seen, unchanged.  There a new area of increased attenuation in subcutaneous fat along the right cleft of the buttock. Area of involvement measures 4.4 cm transverse by 6.0 cm AP by approximately 6.5 cm craniocaudal. Gas in subcutaneous fat is seen the left buttock possibly from prior injection.  The patient is status post hysterectomy. Patient's sigmoid diverticulosis in an otherwise unremarkable: The patient  appears to be status post appendectomy. The small bowel is unremarkable. No lymphadenopathy, free air or intra-abdominal focal fluid collection is identified. Fat containing umbilical hernia is unchanged.  Bones demonstrate a fracture of L5 where the patient is status post vertebral augmentation. Methylmethacrylate extends into the right lateral recess and foramen and the L4-5 disc interspace, unchanged. No acute fracture is seen. The patient has convex left lumbar scoliosis and multilevel spondylosis.  IMPRESSION: Infiltration of fat along the right cleft of the buttock is nonspecific but could be due to contusion or phlegmon and cellulitis. This area should be amenable to direct visual inspection.  No acute finding in the abdomen or pelvis.  Gallstones without evidence of cholecystitis.  Hiatal hernia.  Atherosclerosis.  Lumbar spondylosis. Status post L5 vertebral augmentation, unchanged in appearance.   Electronically  Signed   By: Drusilla Kanner M.D.   On: 09/16/2015 14:38   Dg Chest Port 1 View  09/25/2015   CLINICAL DATA:  Shortness of breath.  Asthma.  EXAM: PORTABLE CHEST 1 VIEW  COMPARISON:  09/16/2015  FINDINGS: Cardiomediastinal silhouette is normal. Mediastinal contours appear intact.  There is no evidence of pneumothorax. There are bilateral pleural effusions, right larger than left, with bilateral lower lobe atelectasis.  Osseous structures are without acute abnormality. Soft tissues are grossly normal.  IMPRESSION: Bilateral pleural effusions, right larger than left, with bilateral lower lobe atelectasis.   Electronically Signed   By: Ted Mcalpine M.D.   On: 10/01/2015 11:11   Dg Chest Port 1 View  09/16/2015   CLINICAL DATA:  Sepsis.  Weakness.  Initial encounter.  EXAM: PORTABLE CHEST 1 VIEW  COMPARISON:  03/03/2015.  FINDINGS: Cardiopericardial silhouette within normal limits. Mediastinal contours normal. Trachea midline. No airspace disease or effusion. Lung volumes are slightly low. Monitoring leads project over the chest.  IMPRESSION: No active disease.   Electronically Signed   By: Andreas Newport M.D.   On: 09/16/2015 13:33     CBC  Recent Labs Lab 09/21/15 0722 09/24/2015 1045  WBC 14.4* 17.0*  HGB 9.1* 8.8*  HCT 26.0* 24.9*  PLT 287 295  MCV 97.7 95.8  MCH 34.2* 33.8  MCHC 35.0 35.3  RDW 13.9 13.8  LYMPHSABS  --  1.8  MONOABS  --  0.4  EOSABS  --  0.0  BASOSABS  --  0.0    Chemistries   Recent Labs Lab 09/21/15 0722 10/02/2015 1045  Jillian 130* 128*  K 3.5 4.3  CL 107 101  CO2 20* 21*  GLUCOSE 86 93  BUN 12 31*  CREATININE 0.66 2.06*  CALCIUM 7.6* 8.6*  AST  --  36  ALT  --  36  ALKPHOS  --  70  BILITOT  --  0.2*   ------------------------------------------------------------------------------------------------------------------ estimated creatinine clearance is 16.3 mL/min (by C-G formula based on Cr of  2.06). ------------------------------------------------------------------------------------------------------------------ No results for input(s): HGBA1C in the last 72 hours. ------------------------------------------------------------------------------------------------------------------ No results for input(s): CHOL, HDL, LDLCALC, TRIG, CHOLHDL, LDLDIRECT in the last 72 hours. ------------------------------------------------------------------------------------------------------------------ No results for input(s): TSH, T4TOTAL, T3FREE, THYROIDAB in the last 72 hours.  Invalid input(s): FREET3 ------------------------------------------------------------------------------------------------------------------ No results for input(s): VITAMINB12, FOLATE, FERRITIN, TIBC, IRON, RETICCTPCT in the last 72 hours.  Coagulation profile No results for input(s): INR, PROTIME in the last 168 hours.  No results for input(s): DDIMER in the last 72 hours.  Cardiac Enzymes No results for input(s): CKMB, TROPONINI, MYOGLOBIN in the last 168 hours.  Invalid input(s): CK ------------------------------------------------------------------------------------------------------------------ Invalid input(s): POCBNP  CBG: No results for input(s): GLUCAP in the last 168 hours.     Studies: Dg Chest Port 1 View  10-25-2015   CLINICAL DATA:  Shortness of breath.  Asthma.  EXAM: PORTABLE CHEST 1 VIEW  COMPARISON:  09/16/2015  FINDINGS: Cardiomediastinal silhouette is normal. Mediastinal contours appear intact.  There is no evidence of pneumothorax. There are bilateral pleural effusions, right larger than left, with bilateral lower lobe atelectasis.  Osseous structures are without acute abnormality. Soft tissues are grossly normal.  IMPRESSION: Bilateral pleural effusions, right larger than left, with bilateral lower lobe atelectasis.   Electronically Signed   By: Ted Mcalpine M.D.   On: 2015-10-25 11:11       Lab Results  Component Value Date   HGBA1C 5.9* 03/07/2015   Lab Results  Component Value Date   CREATININE 2.06* 10-25-15       Scheduled Meds: . mirtazapine  15 mg Oral QHS  . sodium chloride  3 mL Intravenous Q12H   Continuous Infusions:   Principal Problem:   Acute systolic heart failure (HCC) Active Problems:   OSA (obstructive sleep apnea)   Shortness of breath   Hyponatremia   Anemia   MRSA carrier   Leukocytosis   Abscess, gluteal, right   Malnutrition of moderate degree (HCC)   Junctional bradycardia   Goals of care, counseling/discussion   Bradycardia   Encounter for palliative care    Time spent: 45 minutes   [facility]  [facility] Pager [phone]. If 7PM-7AM, please contact night-coverage at [email or web address], password [facility] 09/27/2015, 11:19 AM  LOS: 1 day

## 2015-09-27 NOTE — Progress Notes (Signed)
Pt taken off BIPAP and placed on NRB per md order.  RT will continue to monitor.

## 2015-09-27 NOTE — Progress Notes (Signed)
Utilization Review Completed.Dorcas Carrow T10/09/2015

## 2015-09-28 DIAGNOSIS — D500 Iron deficiency anemia secondary to blood loss (chronic): Secondary | ICD-10-CM

## 2015-09-28 NOTE — Care Management Note (Signed)
Case Management Note  Patient Details  Name: LETTY SALVI MRN: [id number] Date of Birth: 11/17/1925  Subjective/Objective:     Family has requested pt transfer to residential hospice facility, prefer [facility].  CSW aware.                       Expected Discharge Plan:  Hospice Medical Facility  In-House Referral:  Clinical Social Work  Discharge planning Services  CM Consult  Status of Service:  In process, will continue to follow  Shakema, Surita, RN 09/28/2015, 3:58 PM

## 2015-09-28 NOTE — Progress Notes (Signed)
[facility] PROGRESS NOTE  Jillian Wheeler ZOX:[id number] DOB: 08/30/1925 DOA: 10-11-15 PCP: Hoyle Sauer, MD  Length of stay: 2   Assessment/Plan: Principal Problem:   Acute systolic heart failure (HCC) Active Problems:   OSA (obstructive sleep apnea)   Shortness of breath   Hyponatremia   Anemia   MRSA carrier   Leukocytosis   Abscess, gluteal, right   Malnutrition of moderate degree (HCC)   Junctional bradycardia   Goals of care, counseling/discussion   Bradycardia   Encounter for palliative care    1. Bradycardia complicated by acute diastolic heart failure: This is new since last admission.Patient continues to be bradycardic  The ECG appears to show a junctional rhythm. Cardiology and palliative care made aware, patient on full comfort care measures at this time, family agreeable.  Continue comfort care measures for now -- Morphine 1 mg q15 min for dyspnea -- midazolam 1-2 mg q1 hrs for agitation -- glycopyrrholate 0.1 mg twice daily for secretions Palliative care expected hospital death, family requesting residential hospice   Acute Respiratory failure-discontinue BiPAP, continue Ventimask, patient obtunded  2. Hypotension: Continuous vital sign monitoring once a day -- Hold further furosemide  3. Leukocytosis: Patient's discharge WBC was 14K/uL. From chart review of outside records from her nursing home, it appears they saw new infiltrates on CXR the day after last discharge (4 days ago) and started levofloxacin orally for suspected pneumonia. No further antibiotics treatment,  3. Hyponatremia:  This is stable from previous. No further labs  4. Gluteal abscess:  The patient completed her course of doxycycline.  5. Malnutrition Stable.  --Patient currently on Comfort cares    DVT prophylaxsis none  Code Status:      Code Status Orders        Start     Ordered   October 11, 2015 1439  Do not attempt resuscitation (DNR)   Continuous     Question Answer Comment  In the event of cardiac or respiratory ARREST Do not call a "code blue"   In the event of cardiac or respiratory ARREST Do not perform Intubation, CPR, defibrillation or ACLS   In the event of cardiac or respiratory ARREST Use medication by any route, position, wound care, and other measures to relive pain and suffering. May use oxygen, suction and manual treatment of airway obstruction as needed for comfort.      10/11/15 1438    Advance Directive Documentation        Most Recent Value   Type of Advance Directive  Living will, Healthcare Power of Attorney   Pre-existing out of facility DNR order (yellow form or pink MOST form)     "MOST" Form in Place?       Family Communication: family updated about patient's clinical progress Disposition Plan:  As above    Brief narrative: Jillian Wheeler is a 81 y.o. female with a past medical history significant for chronic diastolic CHF, DM, asthma, hiatal hernia, hx MRSA and hx SIADH who presents from NH with dyspnea.  History is collected from the patient's daughter who is at the bedside as well as chart review. The patient was recently discharged from the hospital 4 days ago after sepsis from an MRSA gluteal cleft abscess that was drained surgicalland discharged on oral antibiotics. The patient had been recovering in rehabilitation until this morning when family were called because of acute dyspnea. Family felt that the patient appeared swollen and her arms and legs, short  of breath and having difficulty speaking.  In the ED, The patient had acute renal failure, metabolic acidosis, increase in WBC, hypotension, and bradycardia to the 30s. Because of concern for fluid overload the patient was given Lasix by ED personnel.   Consultants: Rosalin Hawking, MD  Procedures:  None  Antibiotics: Anti-infectives    None         HPI/Subjective: Patient unarousable, bradycardic and obtunded  Objective: Filed Vitals:    09/27/15 1200 09/27/15 1208 09/27/15 1400 09/27/15 1500  BP:      Pulse:  38 26 27  Temp:   97 F (36.1 C)   TempSrc:   Axillary   Resp:  30    SpO2: 100% 100% 92% 94%   No intake or output data in the 24 hours ending 09/28/15 1301  Exam:  General: Obtunded, hypotensive Lungs: Clear to auscultation bilaterally without wheezes or crackles Cardiovascular: Regular rate and rhythm without murmur gallop or rub normal S1 and S2 Abdomen: Nontender, nondistended, soft, bowel sounds positive, no rebound, no ascites, no appreciable mass Extremities: No significant cyanosis, clubbing, or edema bilateral lower extremities     Data Review   Micro Results Recent Results (from the past 240 hour(s))  C difficile quick scan w PCR reflex     Status: Abnormal   Collection Time: 09/20/15 12:02 AM  Result Value Ref Range Status   C Diff antigen POSITIVE (A) NEGATIVE Final   C Diff toxin NEGATIVE NEGATIVE Final   C Diff interpretation   Final    C. difficile present, but toxin not detected. This indicates colonization. In most cases, this does not require treatment. If patient has signs and symptoms consistent with colitis, consider treatment. Requires ENTERIC precautions.  Urine culture     Status: None   Collection Time: 10/06/2015 12:27 PM  Result Value Ref Range Status   Specimen Description URINE, CATHETERIZED  Final   Special Requests NONE  Final   Culture NO GROWTH 1 DAY  Final   Report Status 09/27/2015 FINAL  Final    Radiology Reports Ct Abdomen Pelvis Wo Contrast  09/16/2015   CLINICAL DATA:  Increasing weakness over the past 14 days. Intermittent lower abdominal pain since February, 2016. Constipation. Urinary tract infection. Elevated white blood cell count. Subsequent encounter.  EXAM: CT ABDOMEN AND PELVIS WITHOUT CONTRAST  TECHNIQUE: Multidetector CT imaging of the abdomen and pelvis was performed following the standard protocol without IV contrast.  COMPARISON:  CT abdomen and  pelvis 05/06/2015 and 12/23/2014.  FINDINGS: Hiatal hernia is noted. No pleural or pericardial effusion. Heart size is upper normal. Dependent atelectasis is seen lung bases.  A few small stones are seen in the gallbladder. The liver, spleen, adrenal glands and pancreas are unremarkable. There is cortical atrophy and kidneys. Small cyst in the upper pole of the left kidney is again seen, unchanged.  There a new area of increased attenuation in subcutaneous fat along the right cleft of the buttock. Area of involvement measures 4.4 cm transverse by 6.0 cm AP by approximately 6.5 cm craniocaudal. Gas in subcutaneous fat is seen the left buttock possibly from prior injection.  The patient is status post hysterectomy. Patient's sigmoid diverticulosis in an otherwise unremarkable: The patient appears to be status post appendectomy. The small bowel is unremarkable. No lymphadenopathy, free air or intra-abdominal focal fluid collection is identified. Fat containing umbilical hernia is unchanged.  Bones demonstrate a fracture of L5 where the patient is status post vertebral augmentation. Methylmethacrylate extends  into the right lateral recess and foramen and the L4-5 disc interspace, unchanged. No acute fracture is seen. The patient has convex left lumbar scoliosis and multilevel spondylosis.  IMPRESSION: Infiltration of fat along the right cleft of the buttock is nonspecific but could be due to contusion or phlegmon and cellulitis. This area should be amenable to direct visual inspection.  No acute finding in the abdomen or pelvis.  Gallstones without evidence of cholecystitis.  Hiatal hernia.  Atherosclerosis.  Lumbar spondylosis. Status post L5 vertebral augmentation, unchanged in appearance.   Electronically Signed   By: Drusilla Kanner M.D.   On: 09/16/2015 14:38   Dg Chest Port 1 View  10/20/2015   CLINICAL DATA:  Shortness of breath.  Asthma.  EXAM: PORTABLE CHEST 1 VIEW  COMPARISON:  09/16/2015  FINDINGS:  Cardiomediastinal silhouette is normal. Mediastinal contours appear intact.  There is no evidence of pneumothorax. There are bilateral pleural effusions, right larger than left, with bilateral lower lobe atelectasis.  Osseous structures are without acute abnormality. Soft tissues are grossly normal.  IMPRESSION: Bilateral pleural effusions, right larger than left, with bilateral lower lobe atelectasis.   Electronically Signed   By: Ted Mcalpine M.D.   On: 20-Oct-2015 11:11   Dg Chest Port 1 View  09/16/2015   CLINICAL DATA:  Sepsis.  Weakness.  Initial encounter.  EXAM: PORTABLE CHEST 1 VIEW  COMPARISON:  03/03/2015.  FINDINGS: Cardiopericardial silhouette within normal limits. Mediastinal contours normal. Trachea midline. No airspace disease or effusion. Lung volumes are slightly low. Monitoring leads project over the chest.  IMPRESSION: No active disease.   Electronically Signed   By: Andreas Newport M.D.   On: 09/16/2015 13:33     CBC  Recent Labs Lab 10-20-2015 1045  WBC 17.0*  HGB 8.8*  HCT 24.9*  PLT 295  MCV 95.8  MCH 33.8  MCHC 35.3  RDW 13.8  LYMPHSABS 1.8  MONOABS 0.4  EOSABS 0.0  BASOSABS 0.0    Chemistries   Recent Labs Lab October 20, 2015 1045  NA 128*  K 4.3  CL 101  CO2 21*  GLUCOSE 93  BUN 31*  CREATININE 2.06*  CALCIUM 8.6*  AST 36  ALT 36  ALKPHOS 70  BILITOT 0.2*   ------------------------------------------------------------------------------------------------------------------ estimated creatinine clearance is 16.3 mL/min (by C-G formula based on Cr of 2.06). ------------------------------------------------------------------------------------------------------------------ No results for input(s): HGBA1C in the last 72 hours. ------------------------------------------------------------------------------------------------------------------ No results for input(s): CHOL, HDL, LDLCALC, TRIG, CHOLHDL, LDLDIRECT in the last 72  hours. ------------------------------------------------------------------------------------------------------------------ No results for input(s): TSH, T4TOTAL, T3FREE, THYROIDAB in the last 72 hours.  Invalid input(s): FREET3 ------------------------------------------------------------------------------------------------------------------ No results for input(s): VITAMINB12, FOLATE, FERRITIN, TIBC, IRON, RETICCTPCT in the last 72 hours.  Coagulation profile No results for input(s): INR, PROTIME in the last 168 hours.  No results for input(s): DDIMER in the last 72 hours.  Cardiac Enzymes No results for input(s): CKMB, TROPONINI, MYOGLOBIN in the last 168 hours.  Invalid input(s): CK ------------------------------------------------------------------------------------------------------------------ Invalid input(s): POCBNP   CBG: No results for input(s): GLUCAP in the last 168 hours.     Studies: No results found.    Lab Results  Component Value Date   HGBA1C 5.9* 03/07/2015   Lab Results  Component Value Date   CREATININE 2.06* 10-20-15       Scheduled Meds: . mirtazapine  15 mg Oral QHS  . sodium chloride  3 mL Intravenous Q12H   Continuous Infusions:   Principal Problem:   Acute systolic heart failure (HCC) Active Problems:  OSA (obstructive sleep apnea)   Shortness of breath   Hyponatremia   Anemia   MRSA carrier   Leukocytosis   Abscess, gluteal, right   Malnutrition of moderate degree (HCC)   Junctional bradycardia   Goals of care, counseling/discussion   Bradycardia   Encounter for palliative care    Time spent: 45 minutes   [facility]  [facility] Pager [phone]. If 7PM-7AM, please contact night-coverage at [email or web address], password [facility] 09/28/2015, 1:01 PM  LOS: 2 days

## 2015-10-15 LAB — FUNGUS CULTURE W SMEAR: Fungal Smear: NONE SEEN

## 2015-10-19 NOTE — Discharge Summary (Signed)
Physician Discharge Summary  Jillian Wheeler MRN: [id number] DOB/AGE: 1925-01-20 81 y.o.  PCP: Hoyle Sauer, MD   Admit date: 10/06/2015 Discharge date: 10/07/2015  Discharge Diagnoses:     Principal Problem:   Acute systolic heart failure (HCC) Active Problems:   OSA (obstructive sleep apnea)   Shortness of breath   Hyponatremia   Anemia   MRSA carrier   Leukocytosis   Abscess, gluteal, right   Malnutrition of moderate degree (HCC)   Junctional bradycardia   Goals of care, counseling/discussion   Bradycardia   Encounter for palliative care     Pt converted to asystole at 0029. Janice Norrie, RN and [employer], RN declared time of death.      Medication List    ASK your doctor about these medications        acetaminophen 325 MG tablet  Commonly known as:  TYLENOL  Take 325 mg by mouth at bedtime. To be taken with Tramadol     albuterol (2.5 MG/3ML) 0.083% nebulizer solution  Commonly known as:  PROVENTIL  Take 3 mLs (2.5 mg total) by nebulization every 2 (two) hours as needed for wheezing.     allopurinol 100 MG tablet  Commonly known as:  ZYLOPRIM  Take 100 mg by mouth daily.     amLODipine 5 MG tablet  Commonly known as:  NORVASC  Take 1 tablet (5 mg total) by mouth daily.     atorvastatin 20 MG tablet  Commonly known as:  LIPITOR  Take 20 mg by mouth daily.     CALTRATE 600+D 600-400 MG-UNIT tablet  Generic drug:  Calcium Carbonate-Vitamin D  Take 2 tablets by mouth daily. At bedtime     clonazePAM 1 MG tablet  Commonly known as:  KLONOPIN  Take 1 tablet (1 mg total) by mouth at bedtime.     cyanocobalamin 1000 MCG/ML injection  Commonly known as:  (VITAMIN B-12)  Inject 1,000 mcg into the muscle every 30 (thirty) days. Patient gets on the 8th of every month. Per MAR     Fish Oil 1200 MG Caps  Take 1,200 mg by mouth daily.     fluticasone 50 MCG/ACT nasal spray  Commonly known as:  FLONASE  Place 2 sprays into both nostrils daily as  needed for allergies or rhinitis.     Gerhardt's butt cream Crea  Apply 1 application topically 3 (three) times daily.     HYDROcodone-acetaminophen 5-325 MG tablet  Commonly known as:  NORCO/VICODIN  Take 1 tablet by mouth every 6 (six) hours as needed for moderate pain.     levofloxacin 500 MG tablet  Commonly known as:  LEVAQUIN  Take 500 mg by mouth daily. For 7 days. Started on 09-22-15     levothyroxine 88 MCG tablet  Commonly known as:  SYNTHROID, LEVOTHROID  Take 88 mcg by mouth daily before breakfast.     LOVAZA PO  Take 1,200 mg by mouth daily.     magnesium hydroxide 400 MG/5ML suspension  Commonly known as:  MILK OF MAGNESIA  Take 30 mLs by mouth daily as needed for mild constipation.     Melatonin 5 MG Caps  Take 5 mg by mouth at bedtime.     mirtazapine 15 MG tablet  Commonly known as:  REMERON  Take 15 mg by mouth at bedtime.     pantoprazole 40 MG tablet  Commonly known as:  PROTONIX  Take 40 mg by mouth daily.     PHILLIPS  COLON HEALTH PO  Take 1 capsule by mouth daily as needed (constipation).     DECUBI-VITE PO  Take 1 tablet by mouth daily.     PRESERVISION AREDS 2 Caps  Take 2 capsules by mouth daily at 12 noon.         Discharge Condition:    Disposition: 20-Expired   Consults:      Significant Diagnostic Studies:  Ct Abdomen Pelvis Wo Contrast  09/16/2015  CLINICAL DATA:  Increasing weakness over the past 14 days. Intermittent lower abdominal pain since February, 2016. Constipation. Urinary tract infection. Elevated white blood cell count. Subsequent encounter. EXAM: CT ABDOMEN AND PELVIS WITHOUT CONTRAST TECHNIQUE: Multidetector CT imaging of the abdomen and pelvis was performed following the standard protocol without IV contrast. COMPARISON:  CT abdomen and pelvis 05/06/2015 and 12/23/2014. FINDINGS: Hiatal hernia is noted. No pleural or pericardial effusion. Heart size is upper normal. Dependent atelectasis is seen lung bases. A  few small stones are seen in the gallbladder. The liver, spleen, adrenal glands and pancreas are unremarkable. There is cortical atrophy and kidneys. Small cyst in the upper pole of the left kidney is again seen, unchanged. There a new area of increased attenuation in subcutaneous fat along the right cleft of the buttock. Area of involvement measures 4.4 cm transverse by 6.0 cm AP by approximately 6.5 cm craniocaudal. Gas in subcutaneous fat is seen the left buttock possibly from prior injection. The patient is status post hysterectomy. Patient's sigmoid diverticulosis in an otherwise unremarkable: The patient appears to be status post appendectomy. The small bowel is unremarkable. No lymphadenopathy, free air or intra-abdominal focal fluid collection is identified. Fat containing umbilical hernia is unchanged. Bones demonstrate a fracture of L5 where the patient is status post vertebral augmentation. Methylmethacrylate extends into the right lateral recess and foramen and the L4-5 disc interspace, unchanged. No acute fracture is seen. The patient has convex left lumbar scoliosis and multilevel spondylosis. IMPRESSION: Infiltration of fat along the right cleft of the buttock is nonspecific but could be due to contusion or phlegmon and cellulitis. This area should be amenable to direct visual inspection. No acute finding in the abdomen or pelvis. Gallstones without evidence of cholecystitis. Hiatal hernia. Atherosclerosis. Lumbar spondylosis. Status post L5 vertebral augmentation, unchanged in appearance. Electronically Signed   By: Drusilla Kannerhomas  Dalessio M.D.   On: 09/16/2015 14:38   Dg Chest Port 1 View  09/21/2015  CLINICAL DATA:  Shortness of breath.  Asthma. EXAM: PORTABLE CHEST 1 VIEW COMPARISON:  09/16/2015 FINDINGS: Cardiomediastinal silhouette is normal. Mediastinal contours appear intact. There is no evidence of pneumothorax. There are bilateral pleural effusions, right larger than left, with bilateral lower  lobe atelectasis. Osseous structures are without acute abnormality. Soft tissues are grossly normal. IMPRESSION: Bilateral pleural effusions, right larger than left, with bilateral lower lobe atelectasis. Electronically Signed   By: Ted Mcalpineobrinka  Dimitrova M.D.   On: 05/21/15 11:11   Dg Chest Port 1 View  09/16/2015  CLINICAL DATA:  Sepsis.  Weakness.  Initial encounter. EXAM: PORTABLE CHEST 1 VIEW COMPARISON:  03/03/2015. FINDINGS: Cardiopericardial silhouette within normal limits. Mediastinal contours normal. Trachea midline. No airspace disease or effusion. Lung volumes are slightly low. Monitoring leads project over the chest. IMPRESSION: No active disease. Electronically Signed   By: Andreas NewportGeoffrey  Lamke M.D.   On: 09/16/2015 13:33        There were no vitals filed for this visit.   Microbiology: No results found for this or any previous visit (from  the past 240 hour(s)).     Blood Culture    Component Value Date/Time   SDES URINE, CATHETERIZED 10-21-2015 1227   SPECREQUEST NONE 10-21-2015 1227   CULT NO GROWTH 1 DAY October 21, 2015 1227   REPTSTATUS 09/27/2015 FINAL 10-21-15 1227      Labs: No results found for this or any previous visit (from the past 48 hour(s)).   Lipid Panel  No results found for: CHOL, TRIG, HDL, CHOLHDL, VLDL, LDLCALC, LDLDIRECT   Lab Results  Component Value Date   HGBA1C 5.9* 03/07/2015     Lab Results  Component Value Date   CREATININE 2.06* 2015-10-21     HPI :Jillian Wheeler is a 81 y.o. female with a past medical history significant for chronic diastolic CHF, DM, asthma, hiatal hernia, hx MRSA and hx SIADH who presents from NH with dyspnea.  History is collected from the patient's daughter who is at the bedside as well as chart review. The patient was recently discharged from the hospital 4 days ago after sepsis from an MRSA gluteal cleft abscess that was drained surgicalland discharged on oral antibiotics. The patient had been recovering in  rehabilitation until this morning when family were called because of acute dyspnea. Family felt that the patient appeared swollen and her arms and legs, short of breath and having difficulty speaking.  In the ED, The patient had acute renal failure, metabolic acidosis, increase in WBC, hypotension, and bradycardia to the 30s. Because of concern for fluid overload the patient was given Lasix by ED personnel.  HOSPITAL COURSE:   1. Bradycardia complicated by acute diastolic heart failure: This is new since last admission.Patient continues to be bradycardic The ECG appears to show a junctional rhythm. Cardiology and palliative care made aware, patient on full comfort care measures at this time, family agreeable.  Continue comfort care measures for now -- Morphine 1 mg q15 min for dyspnea -- midazolam 1-2 mg q1 hrs for agitation -- glycopyrrholate 0.1 mg twice daily for secretions Palliative care expected hospital death,  Pt converted to asystole at 0029  Acute Respiratory failure- continued Ventimask, patient obtunded   2. Hypotension:due to bradycardia   3. Leukocytosis: Patient's discharge WBC was 14K/uL. From chart review of outside records from her nursing home, it appears they saw new infiltrates on CXR the day after last discharge (4 days ago) and started levofloxacin orally for suspected pneumonia. No further antibiotics treatment,  3. Hyponatremia:  This is stable from previous. No further labs  4. Gluteal abscess:  The patient completed her course of doxycycline.  5. Malnutrition  --Patient currently on Comfort cares  Discharge Exam:    Blood pressure 113/80, pulse 27, temperature 97 F (36.1 C), temperature source Axillary, resp. rate 30, SpO2 94 %.         SignedRicharda Overlie 10/07/2015, 9:55 PM        Time spent >45 mins

## 2015-10-19 NOTE — Progress Notes (Signed)
Pt converted to asystole at 0029. Janice NorrieMisty Ennis, RN and [employer]ale[employer]becca Reap, RN declared time of death. Schorr, NP notified and death certificate was signed. Family at bedside during time of death. The Next of Kin, Eudelia BunchJuanita Rumley, patient's daughter, requested [facility]. The daughter said she might want an autopsy, and she will "sleep on it" and decided in the morning. Medical Examiner denied the case. RN explained that an autopsy would have to be done privately and contact information was given. Daughter took patient belongings. [facility] called. Pt will be cleansed and taken to the morgue.    Alba DestineMisty L Ennis, RN, BSN

## 2015-10-19 DEATH — deceased

## 2015-10-29 LAB — AFB CULTURE WITH SMEAR (NOT AT ARMC): ACID FAST SMEAR: NONE SEEN

## 2016-09-30 IMAGING — CT CT ABD-PELV W/ CM
2 of 5 series · 15 of 46 positions shown, 17 images · IV contrast (READICAT/WATER & 75CC OMNI 300)
Comparison: None.

CLINICAL DATA: Lower abdominal pain for 7 weeks. Renal
insufficiency.

EXAM:
CT ABDOMEN AND PELVIS WITH CONTRAST
TECHNIQUE: Multidetector CT imaging of the abdomen and pelvis was performed
using the standard protocol following bolus administration of
intravenous contrast.
CONTRAST:  75mL OMNIPAQUE IOHEXOL 300 MG/ML  SOLN

[Series 2: abd/pelvis with · axial · 0.72mm/px · z∈[-331,+94]mm · 12 of 95 slices shown, 14 images]
[im 5/95  soft-tissue]
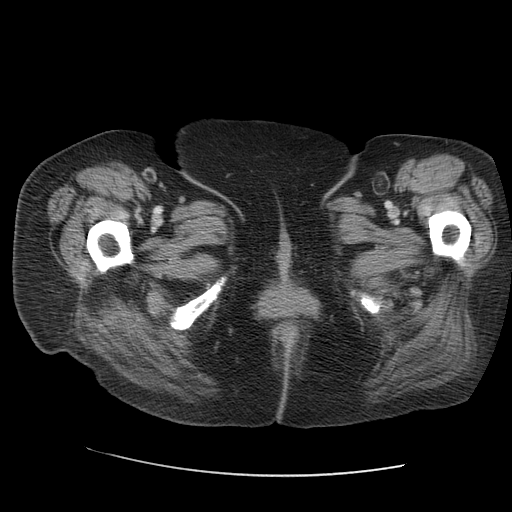
[im 5/95  bone]
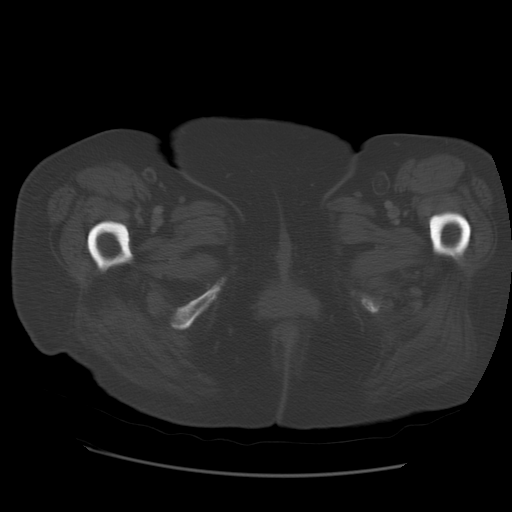
[im 15/95  soft-tissue]
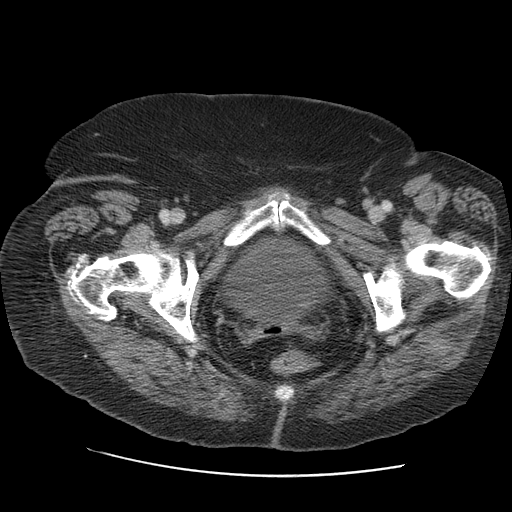
[im 20/95  soft-tissue]
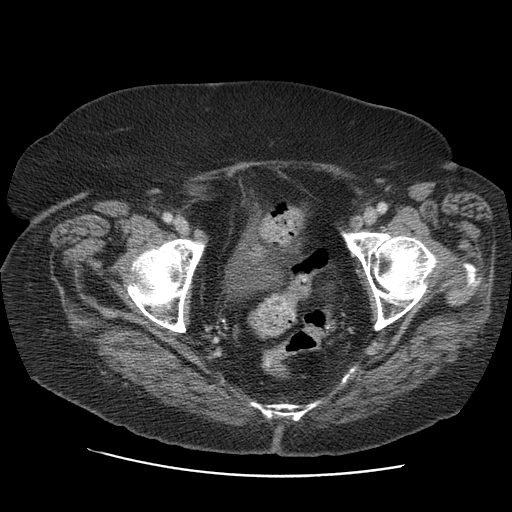
[im 30/95  soft-tissue]
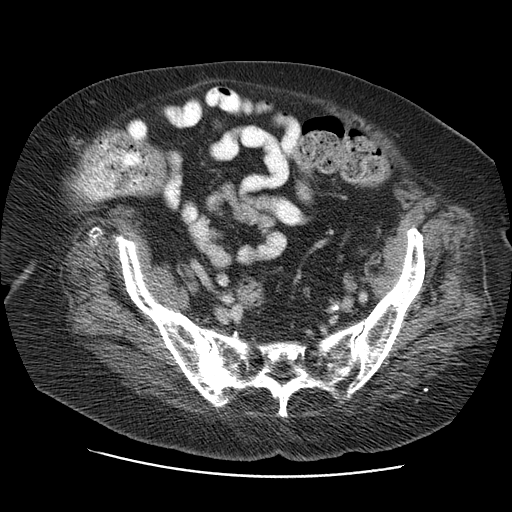
[im 35/95  soft-tissue]
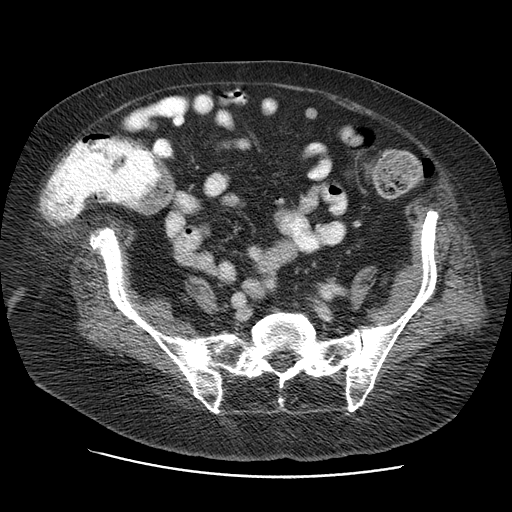
[im 45/95  soft-tissue]
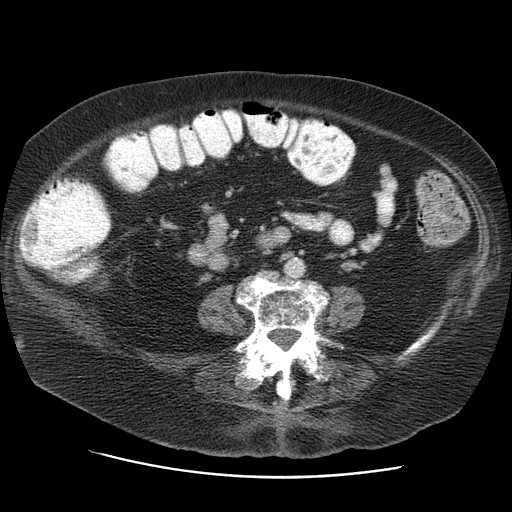
[im 50/95  soft-tissue]
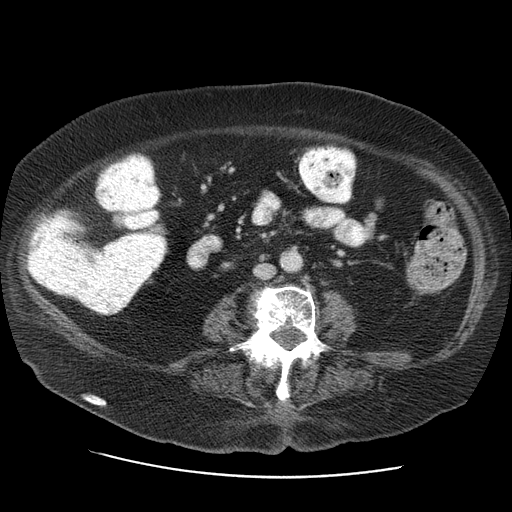
[im 60/95  soft-tissue]
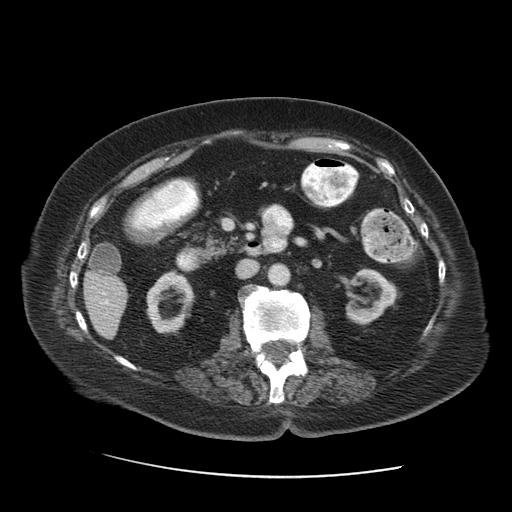
[im 65/95  soft-tissue]
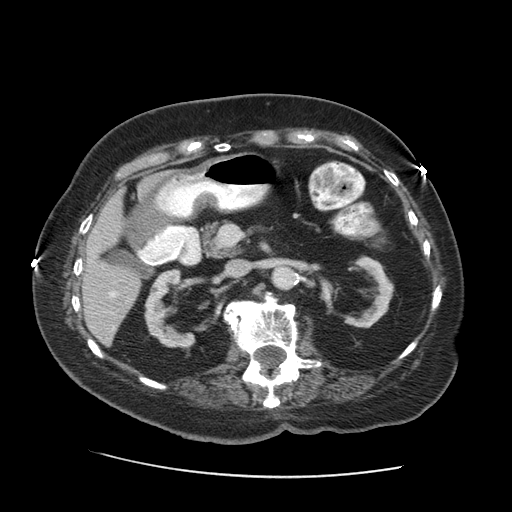
[im 65/95  bone]
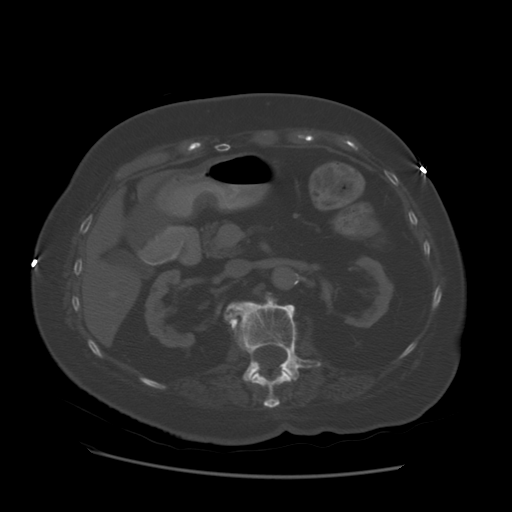
[im 75/95  soft-tissue]
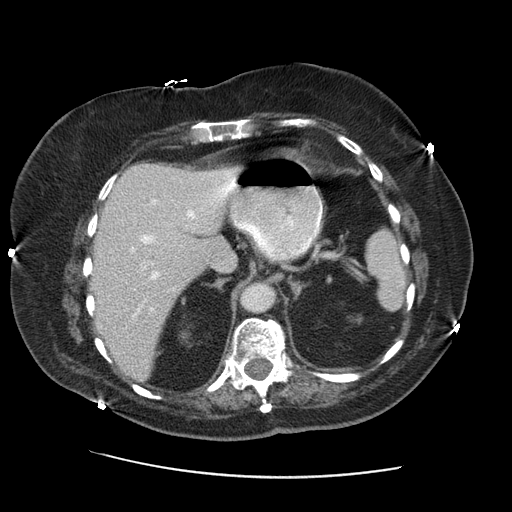
[im 80/95  soft-tissue]
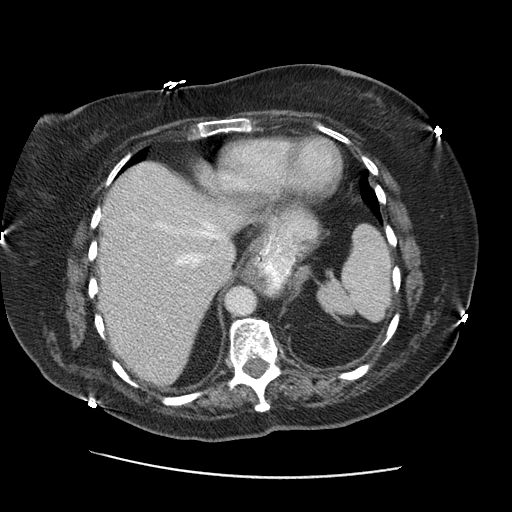
[im 90/95  soft-tissue]
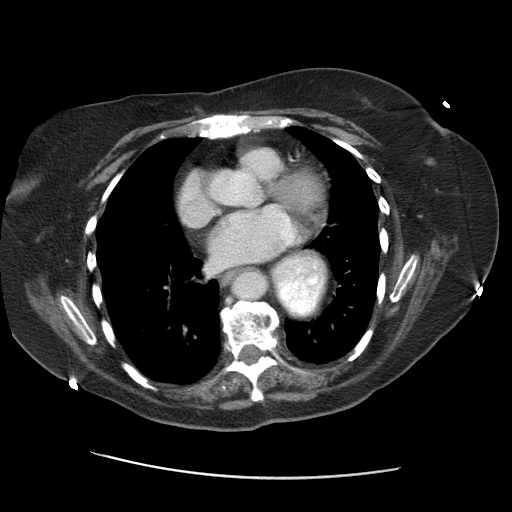

[Series 400: cor · coronal · 0.94mm/px · 3 of 131 slices shown]
[im 44/131  soft-tissue]
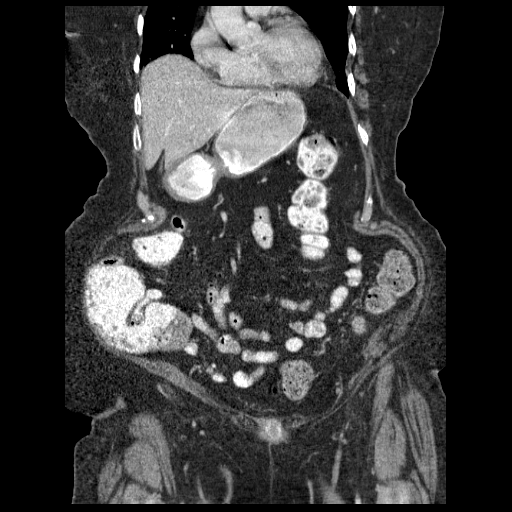
[im 58/131  soft-tissue]
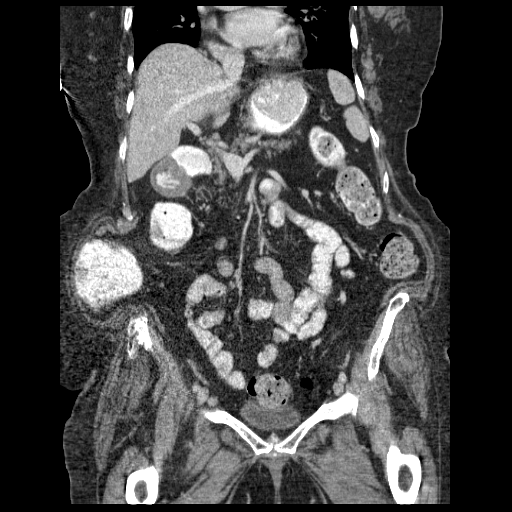
[im 73/131  soft-tissue]
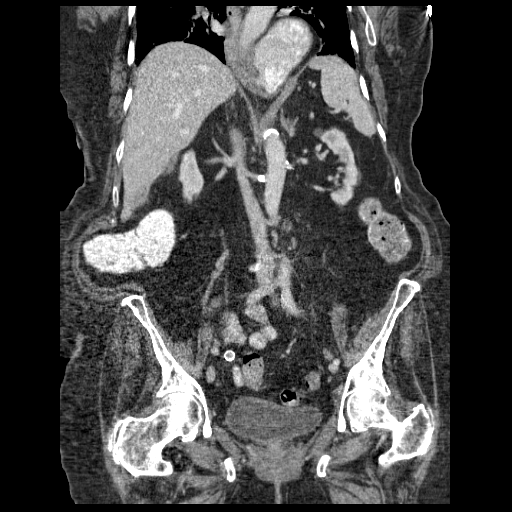

[15 of 46 positions shown; findings below may reference images not displayed]

FINDINGS: Lower chest: Dependent subsegmental atelectasis in the lower lobes.
Moderate-sized hiatal hernia. Coronary artery atherosclerosis.

Hepatobiliary: Unremarkable

Pancreas: Unremarkable

Spleen: Unremarkable

Adrenals/Urinary Tract: Bilateral renal cortical thinning compatible
with atrophy. Left kidney upper pole 1.4 by 1.1 cm hypodense
peripheral lesion, internal density 28 Hounsfield units on portal
venous phase images and 19 Hounsfield units on delayed phase images,
not visible at all on the prior CT scan from 03/11/2008. Fluid
density in the gallbladder is about 20 Hounsfield units.

Stomach/Bowel: Hiatal hernia. Appendix absent. Borderline prominence
of stool in the distal half of the colon. Duodenal diverticular
present in the third portion.

Vascular/Lymphatic: Porta hepatis lymph node 0.8 cm in short axis
image 26 series 2. Right external iliac node 1.0 cm in short axis,
image 75 series 2. Left common iliac node 1.0 cm in short axis,
image 59 series 2. Left external iliac node 1.0 cm in short axis,
image 66 series 2.

Aortoiliac atherosclerotic vascular disease. Atherosclerotic plaque
is present at the origin of both renal arteries.

Reproductive: Uterus and ovaries absent.

Other: No supplemental non-categorized findings.

Musculoskeletal: Degenerative arthropathy of both hips. Sclerosis
along the upper posterior margin the right sacroiliac joint, likely
degenerative. Superior endplate compression fracture at the
transitional L5 level, with methacrylate in the vertebral body, in
the L4-5 intervertebral disc space, and also tracking in the right
lateral recess and paracentral epidural position at the L5 vertebral
level as shown on image 56 series 2. Levoconvex lumbar scoliosis
with grade 1 anterolisthesis at L4-5, facet fusion at L4-5
bilaterally and on the right at L3-4, and extensive facet and
intervertebral spurring in the lower lumbar spine.

Umbilical hernia contains adipose tissue with some calcifications
there is laxity of the anterior abdominal wall musculature.
IMPRESSION: 1. One possible cause for lower abdominal pain is prominence of
stool in distal colon potentially reflecting constipation.
2. Umbilical hernia contains adipose tissue with some anterior
chronic appearing calcifications. There is also a moderate-sized
hiatal hernia.
3. Coronary artery atherosclerosis.
4. 1.4 by 1.1 cm complex appearing hypodense peripheral lesion of
the left kidney upper pole, not present on the February 2008 CT chest.
Questionable relative washout with reduction of 9 Hounsfield units
between portal venous and delayed phase images. Because I do not
have a precontrast series I cannot really sign a Bosniak
classification because I can't be certain that this is enhancing.
Consider sonography or renal protocol MRI with and without contrast
for further characterization.
5. Borderline enlarged pelvic lymph nodes, significance uncertain
but probably reactive.
6. Atherosclerosis.
7. Degenerative arthropathy of the hips.
8. Lower lumbar spondylosis and degenerative disc disease. Prior
vertebral augmentation at L5, with some extension of methacrylate
into the right anterior epidural space and the L4-5 intervertebral
space.
